# Patient Record
Sex: Male | Born: 1960 | Race: White | Hispanic: No | Marital: Married | Smoking: Former smoker
Health system: Southern US, Community
[De-identification: ages and names within clinical notes are randomized; demographics above are authoritative.]

## PROBLEM LIST (undated history)

## (undated) DIAGNOSIS — E785 Hyperlipidemia, unspecified: Secondary | ICD-10-CM

## (undated) DIAGNOSIS — S270XXA Traumatic pneumothorax, initial encounter: Secondary | ICD-10-CM

## (undated) DIAGNOSIS — I679 Cerebrovascular disease, unspecified: Secondary | ICD-10-CM

## (undated) DIAGNOSIS — Z789 Other specified health status: Secondary | ICD-10-CM

## (undated) DIAGNOSIS — C61 Malignant neoplasm of prostate: Secondary | ICD-10-CM

## (undated) DIAGNOSIS — I872 Venous insufficiency (chronic) (peripheral): Secondary | ICD-10-CM

## (undated) DIAGNOSIS — K7581 Nonalcoholic steatohepatitis (NASH): Secondary | ICD-10-CM

## (undated) DIAGNOSIS — F17200 Nicotine dependence, unspecified, uncomplicated: Secondary | ICD-10-CM

## (undated) DIAGNOSIS — I639 Cerebral infarction, unspecified: Secondary | ICD-10-CM

## (undated) DIAGNOSIS — G4730 Sleep apnea, unspecified: Secondary | ICD-10-CM

## (undated) DIAGNOSIS — I739 Peripheral vascular disease, unspecified: Secondary | ICD-10-CM

## (undated) HISTORY — DX: Nonalcoholic steatohepatitis (NASH): K75.81

## (undated) HISTORY — DX: Traumatic pneumothorax, initial encounter: S27.0XXA

## (undated) HISTORY — DX: Cerebrovascular disease, unspecified: I67.9

## (undated) HISTORY — PX: KNEE SURGERY: SHX244

## (undated) HISTORY — DX: Malignant neoplasm of prostate: C61

## (undated) HISTORY — DX: Hyperlipidemia, unspecified: E78.5

## (undated) HISTORY — DX: Cerebral infarction, unspecified: I63.9

## (undated) HISTORY — DX: Other specified health status: Z78.9

## (undated) HISTORY — DX: Venous insufficiency (chronic) (peripheral): I87.2

## (undated) HISTORY — DX: Nicotine dependence, unspecified, uncomplicated: F17.200

## (undated) HISTORY — DX: Hereditary hemochromatosis: E83.110

## (undated) HISTORY — DX: Peripheral vascular disease, unspecified: I73.9

## (undated) HISTORY — DX: Morbid (severe) obesity due to excess calories: E66.01

## (undated) HISTORY — DX: Sleep apnea, unspecified: G47.30

---

## 2015-11-15 DIAGNOSIS — Z79899 Other long term (current) drug therapy: Secondary | ICD-10-CM | POA: Diagnosis not present

## 2015-11-15 DIAGNOSIS — Z125 Encounter for screening for malignant neoplasm of prostate: Secondary | ICD-10-CM | POA: Diagnosis not present

## 2015-11-15 DIAGNOSIS — E784 Other hyperlipidemia: Secondary | ICD-10-CM | POA: Diagnosis not present

## 2015-12-13 DIAGNOSIS — N401 Enlarged prostate with lower urinary tract symptoms: Secondary | ICD-10-CM | POA: Diagnosis not present

## 2015-12-13 DIAGNOSIS — R972 Elevated prostate specific antigen [PSA]: Secondary | ICD-10-CM | POA: Diagnosis not present

## 2016-01-29 DIAGNOSIS — C61 Malignant neoplasm of prostate: Secondary | ICD-10-CM | POA: Diagnosis not present

## 2016-01-29 DIAGNOSIS — N401 Enlarged prostate with lower urinary tract symptoms: Secondary | ICD-10-CM | POA: Diagnosis not present

## 2016-01-29 DIAGNOSIS — R972 Elevated prostate specific antigen [PSA]: Secondary | ICD-10-CM | POA: Diagnosis not present

## 2016-01-29 HISTORY — PX: PROSTATE BIOPSY: SHX241

## 2016-02-05 DIAGNOSIS — C61 Malignant neoplasm of prostate: Secondary | ICD-10-CM | POA: Diagnosis not present

## 2016-02-05 DIAGNOSIS — J0100 Acute maxillary sinusitis, unspecified: Secondary | ICD-10-CM | POA: Diagnosis not present

## 2016-02-13 DIAGNOSIS — C61 Malignant neoplasm of prostate: Secondary | ICD-10-CM | POA: Diagnosis not present

## 2016-02-13 DIAGNOSIS — I700 Atherosclerosis of aorta: Secondary | ICD-10-CM | POA: Diagnosis not present

## 2016-02-25 DIAGNOSIS — C61 Malignant neoplasm of prostate: Secondary | ICD-10-CM | POA: Diagnosis not present

## 2016-03-10 DIAGNOSIS — R7989 Other specified abnormal findings of blood chemistry: Secondary | ICD-10-CM | POA: Insufficient documentation

## 2016-03-10 DIAGNOSIS — I639 Cerebral infarction, unspecified: Secondary | ICD-10-CM | POA: Insufficient documentation

## 2016-03-10 DIAGNOSIS — M1990 Unspecified osteoarthritis, unspecified site: Secondary | ICD-10-CM | POA: Insufficient documentation

## 2016-03-10 DIAGNOSIS — C61 Malignant neoplasm of prostate: Secondary | ICD-10-CM | POA: Diagnosis not present

## 2016-03-27 DIAGNOSIS — G4733 Obstructive sleep apnea (adult) (pediatric): Secondary | ICD-10-CM | POA: Insufficient documentation

## 2016-03-27 DIAGNOSIS — C61 Malignant neoplasm of prostate: Secondary | ICD-10-CM | POA: Diagnosis not present

## 2016-03-27 DIAGNOSIS — R748 Abnormal levels of other serum enzymes: Secondary | ICD-10-CM | POA: Insufficient documentation

## 2016-03-27 DIAGNOSIS — Z9989 Dependence on other enabling machines and devices: Secondary | ICD-10-CM | POA: Insufficient documentation

## 2016-04-10 DIAGNOSIS — Z8673 Personal history of transient ischemic attack (TIA), and cerebral infarction without residual deficits: Secondary | ICD-10-CM | POA: Diagnosis not present

## 2016-04-10 DIAGNOSIS — C61 Malignant neoplasm of prostate: Secondary | ICD-10-CM | POA: Diagnosis not present

## 2016-04-10 HISTORY — PX: OTHER SURGICAL HISTORY: SHX169

## 2016-05-20 DIAGNOSIS — G4730 Sleep apnea, unspecified: Secondary | ICD-10-CM | POA: Diagnosis not present

## 2016-05-20 DIAGNOSIS — Z9079 Acquired absence of other genital organ(s): Secondary | ICD-10-CM | POA: Diagnosis not present

## 2016-05-20 DIAGNOSIS — C61 Malignant neoplasm of prostate: Secondary | ICD-10-CM | POA: Diagnosis not present

## 2016-07-01 DIAGNOSIS — M25551 Pain in right hip: Secondary | ICD-10-CM | POA: Diagnosis not present

## 2016-07-01 DIAGNOSIS — M1611 Unilateral primary osteoarthritis, right hip: Secondary | ICD-10-CM | POA: Diagnosis not present

## 2016-08-14 DIAGNOSIS — C61 Malignant neoplasm of prostate: Secondary | ICD-10-CM | POA: Diagnosis not present

## 2016-08-14 DIAGNOSIS — N529 Male erectile dysfunction, unspecified: Secondary | ICD-10-CM | POA: Diagnosis not present

## 2016-11-13 DIAGNOSIS — L255 Unspecified contact dermatitis due to plants, except food: Secondary | ICD-10-CM | POA: Diagnosis not present

## 2016-11-13 DIAGNOSIS — E785 Hyperlipidemia, unspecified: Secondary | ICD-10-CM | POA: Diagnosis not present

## 2016-11-13 DIAGNOSIS — I872 Venous insufficiency (chronic) (peripheral): Secondary | ICD-10-CM | POA: Diagnosis not present

## 2016-11-13 DIAGNOSIS — F17200 Nicotine dependence, unspecified, uncomplicated: Secondary | ICD-10-CM | POA: Diagnosis not present

## 2016-11-13 DIAGNOSIS — I679 Cerebrovascular disease, unspecified: Secondary | ICD-10-CM | POA: Diagnosis not present

## 2016-11-20 DIAGNOSIS — C61 Malignant neoplasm of prostate: Secondary | ICD-10-CM | POA: Diagnosis not present

## 2016-11-20 DIAGNOSIS — N529 Male erectile dysfunction, unspecified: Secondary | ICD-10-CM | POA: Diagnosis not present

## 2017-02-26 DIAGNOSIS — N529 Male erectile dysfunction, unspecified: Secondary | ICD-10-CM | POA: Diagnosis not present

## 2017-02-26 DIAGNOSIS — C61 Malignant neoplasm of prostate: Secondary | ICD-10-CM | POA: Diagnosis not present

## 2017-04-07 DIAGNOSIS — S86912A Strain of unspecified muscle(s) and tendon(s) at lower leg level, left leg, initial encounter: Secondary | ICD-10-CM | POA: Diagnosis not present

## 2017-04-07 DIAGNOSIS — S8992XA Unspecified injury of left lower leg, initial encounter: Secondary | ICD-10-CM | POA: Diagnosis not present

## 2017-04-07 DIAGNOSIS — F17210 Nicotine dependence, cigarettes, uncomplicated: Secondary | ICD-10-CM | POA: Diagnosis not present

## 2017-04-07 DIAGNOSIS — M25562 Pain in left knee: Secondary | ICD-10-CM | POA: Diagnosis not present

## 2017-04-08 DIAGNOSIS — M25562 Pain in left knee: Secondary | ICD-10-CM | POA: Diagnosis not present

## 2017-04-09 DIAGNOSIS — M25562 Pain in left knee: Secondary | ICD-10-CM | POA: Diagnosis not present

## 2017-04-09 DIAGNOSIS — M25462 Effusion, left knee: Secondary | ICD-10-CM | POA: Diagnosis not present

## 2017-04-13 DIAGNOSIS — M25462 Effusion, left knee: Secondary | ICD-10-CM | POA: Diagnosis not present

## 2017-04-13 DIAGNOSIS — M25562 Pain in left knee: Secondary | ICD-10-CM | POA: Diagnosis not present

## 2017-04-16 DIAGNOSIS — L259 Unspecified contact dermatitis, unspecified cause: Secondary | ICD-10-CM | POA: Diagnosis not present

## 2017-04-19 DIAGNOSIS — L300 Nummular dermatitis: Secondary | ICD-10-CM | POA: Diagnosis not present

## 2017-04-19 DIAGNOSIS — L299 Pruritus, unspecified: Secondary | ICD-10-CM | POA: Diagnosis not present

## 2017-06-18 DIAGNOSIS — E785 Hyperlipidemia, unspecified: Secondary | ICD-10-CM | POA: Diagnosis not present

## 2017-06-18 DIAGNOSIS — C61 Malignant neoplasm of prostate: Secondary | ICD-10-CM | POA: Diagnosis not present

## 2017-06-18 DIAGNOSIS — N529 Male erectile dysfunction, unspecified: Secondary | ICD-10-CM | POA: Diagnosis not present

## 2017-06-18 DIAGNOSIS — Z8546 Personal history of malignant neoplasm of prostate: Secondary | ICD-10-CM | POA: Diagnosis not present

## 2017-06-18 DIAGNOSIS — I872 Venous insufficiency (chronic) (peripheral): Secondary | ICD-10-CM | POA: Diagnosis not present

## 2017-06-18 DIAGNOSIS — F17200 Nicotine dependence, unspecified, uncomplicated: Secondary | ICD-10-CM | POA: Diagnosis not present

## 2017-07-28 DIAGNOSIS — J0100 Acute maxillary sinusitis, unspecified: Secondary | ICD-10-CM | POA: Diagnosis not present

## 2017-07-28 DIAGNOSIS — J205 Acute bronchitis due to respiratory syncytial virus: Secondary | ICD-10-CM | POA: Diagnosis not present

## 2017-09-24 DIAGNOSIS — K635 Polyp of colon: Secondary | ICD-10-CM | POA: Diagnosis not present

## 2017-09-24 DIAGNOSIS — D124 Benign neoplasm of descending colon: Secondary | ICD-10-CM | POA: Diagnosis not present

## 2017-09-24 DIAGNOSIS — Z86010 Personal history of colonic polyps: Secondary | ICD-10-CM | POA: Diagnosis not present

## 2017-09-24 DIAGNOSIS — Z1211 Encounter for screening for malignant neoplasm of colon: Secondary | ICD-10-CM | POA: Diagnosis not present

## 2017-09-24 DIAGNOSIS — D123 Benign neoplasm of transverse colon: Secondary | ICD-10-CM | POA: Diagnosis not present

## 2017-09-24 HISTORY — PX: COLONOSCOPY: SHX174

## 2017-10-16 DIAGNOSIS — S2231XA Fracture of one rib, right side, initial encounter for closed fracture: Secondary | ICD-10-CM | POA: Diagnosis not present

## 2017-10-16 DIAGNOSIS — R0781 Pleurodynia: Secondary | ICD-10-CM | POA: Diagnosis not present

## 2017-10-16 DIAGNOSIS — S299XXA Unspecified injury of thorax, initial encounter: Secondary | ICD-10-CM | POA: Diagnosis not present

## 2017-10-22 DIAGNOSIS — N529 Male erectile dysfunction, unspecified: Secondary | ICD-10-CM | POA: Diagnosis not present

## 2017-10-22 DIAGNOSIS — C61 Malignant neoplasm of prostate: Secondary | ICD-10-CM | POA: Diagnosis not present

## 2017-12-19 DIAGNOSIS — M545 Low back pain: Secondary | ICD-10-CM | POA: Diagnosis not present

## 2017-12-21 DIAGNOSIS — L03116 Cellulitis of left lower limb: Secondary | ICD-10-CM | POA: Diagnosis not present

## 2017-12-21 DIAGNOSIS — L259 Unspecified contact dermatitis, unspecified cause: Secondary | ICD-10-CM | POA: Diagnosis not present

## 2017-12-21 DIAGNOSIS — L03115 Cellulitis of right lower limb: Secondary | ICD-10-CM | POA: Diagnosis not present

## 2017-12-21 DIAGNOSIS — M545 Low back pain: Secondary | ICD-10-CM | POA: Diagnosis not present

## 2018-02-19 DIAGNOSIS — N3001 Acute cystitis with hematuria: Secondary | ICD-10-CM | POA: Diagnosis not present

## 2018-03-25 DIAGNOSIS — L03115 Cellulitis of right lower limb: Secondary | ICD-10-CM | POA: Diagnosis not present

## 2018-03-25 DIAGNOSIS — L03116 Cellulitis of left lower limb: Secondary | ICD-10-CM | POA: Diagnosis not present

## 2018-03-25 DIAGNOSIS — I872 Venous insufficiency (chronic) (peripheral): Secondary | ICD-10-CM | POA: Diagnosis not present

## 2018-03-25 DIAGNOSIS — F17200 Nicotine dependence, unspecified, uncomplicated: Secondary | ICD-10-CM | POA: Diagnosis not present

## 2018-04-15 DIAGNOSIS — J0100 Acute maxillary sinusitis, unspecified: Secondary | ICD-10-CM | POA: Diagnosis not present

## 2018-04-22 DIAGNOSIS — C61 Malignant neoplasm of prostate: Secondary | ICD-10-CM | POA: Diagnosis not present

## 2018-04-22 DIAGNOSIS — R3989 Other symptoms and signs involving the genitourinary system: Secondary | ICD-10-CM | POA: Diagnosis not present

## 2018-04-22 DIAGNOSIS — N529 Male erectile dysfunction, unspecified: Secondary | ICD-10-CM | POA: Diagnosis not present

## 2018-08-19 DIAGNOSIS — K760 Fatty (change of) liver, not elsewhere classified: Secondary | ICD-10-CM | POA: Insufficient documentation

## 2018-08-19 DIAGNOSIS — F17200 Nicotine dependence, unspecified, uncomplicated: Secondary | ICD-10-CM | POA: Diagnosis not present

## 2018-08-19 DIAGNOSIS — N5231 Erectile dysfunction following radical prostatectomy: Secondary | ICD-10-CM | POA: Insufficient documentation

## 2018-08-19 DIAGNOSIS — Z8546 Personal history of malignant neoplasm of prostate: Secondary | ICD-10-CM | POA: Insufficient documentation

## 2018-08-19 DIAGNOSIS — D6859 Other primary thrombophilia: Secondary | ICD-10-CM | POA: Diagnosis not present

## 2018-08-19 DIAGNOSIS — M6289 Other specified disorders of muscle: Secondary | ICD-10-CM | POA: Insufficient documentation

## 2018-08-19 DIAGNOSIS — E669 Obesity, unspecified: Secondary | ICD-10-CM | POA: Diagnosis not present

## 2018-08-23 DIAGNOSIS — Z8545 Personal history of malignant neoplasm of unspecified male genital organ: Secondary | ICD-10-CM | POA: Diagnosis not present

## 2018-09-20 DIAGNOSIS — M5440 Lumbago with sciatica, unspecified side: Secondary | ICD-10-CM | POA: Diagnosis not present

## 2018-10-21 DIAGNOSIS — E785 Hyperlipidemia, unspecified: Secondary | ICD-10-CM | POA: Diagnosis not present

## 2018-10-21 DIAGNOSIS — I739 Peripheral vascular disease, unspecified: Secondary | ICD-10-CM | POA: Diagnosis not present

## 2018-10-21 DIAGNOSIS — I872 Venous insufficiency (chronic) (peripheral): Secondary | ICD-10-CM | POA: Diagnosis not present

## 2018-10-21 DIAGNOSIS — I679 Cerebrovascular disease, unspecified: Secondary | ICD-10-CM | POA: Diagnosis not present

## 2018-11-25 DIAGNOSIS — R600 Localized edema: Secondary | ICD-10-CM | POA: Diagnosis not present

## 2018-11-25 DIAGNOSIS — I8312 Varicose veins of left lower extremity with inflammation: Secondary | ICD-10-CM | POA: Diagnosis not present

## 2018-12-05 ENCOUNTER — Other Ambulatory Visit: Payer: Self-pay | Admitting: Family Medicine

## 2018-12-05 DIAGNOSIS — R600 Localized edema: Secondary | ICD-10-CM

## 2018-12-16 ENCOUNTER — Ambulatory Visit
Admission: RE | Admit: 2018-12-16 | Discharge: 2018-12-16 | Disposition: A | Discharge status: Home / Self Care | Payer: BC Managed Care – PPO | Source: Ambulatory Visit | Attending: Family Medicine | Admitting: Family Medicine

## 2018-12-16 DIAGNOSIS — Z8546 Personal history of malignant neoplasm of prostate: Secondary | ICD-10-CM | POA: Diagnosis not present

## 2018-12-16 DIAGNOSIS — I700 Atherosclerosis of aorta: Secondary | ICD-10-CM | POA: Diagnosis not present

## 2018-12-16 DIAGNOSIS — R600 Localized edema: Secondary | ICD-10-CM

## 2018-12-16 IMAGING — CT CT ANGIOGRAPHY ABDOMEN AND PELVIS WITH CONTRAST AND WITHOUT CONT
2 of 7 series · 14 of 46 positions shown, 16 images · IV contrast (iopamidol)
Comparison: CT [DATE]

CLINICAL DATA: 57-year-old with lower extremity edema. Former
smoker. History of prostate cancer.

EXAM:
CT ANGIOGRAPHY ABDOMEN AND PELVIS WITH CONTRAST AND WITHOUT CONTRAST
TECHNIQUE: Multidetector CT imaging of the abdomen and pelvis was performed
using the standard protocol during bolus administration of
intravenous contrast. Multiplanar reconstructed images and MIPs were
obtained and reviewed to evaluate the vascular anatomy.
CONTRAST:  75mL [80] IOPAMIDOL ([80]) INJECTION 76%

[Series 5: cta arterial 2.00 bv36 s3 axial st · axial · arterial · 0.95mm/px · z∈[+1079,+1599]mm · 11 of 288 slices shown, 13 images]
[im 14/288  soft-tissue]
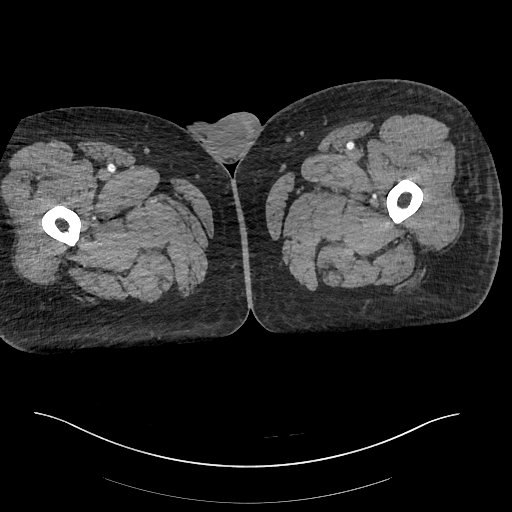
[im 14/288  bone]
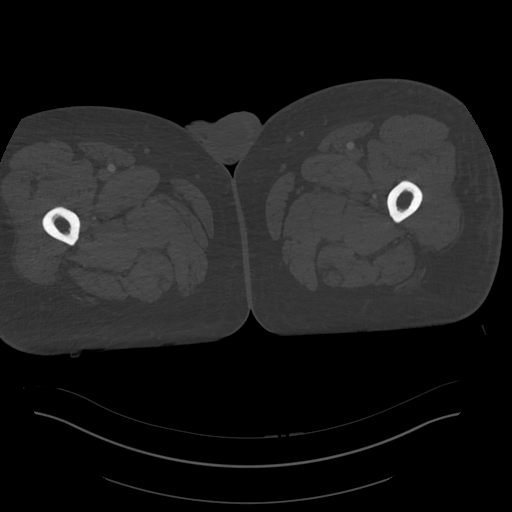
[im 40/288  soft-tissue]
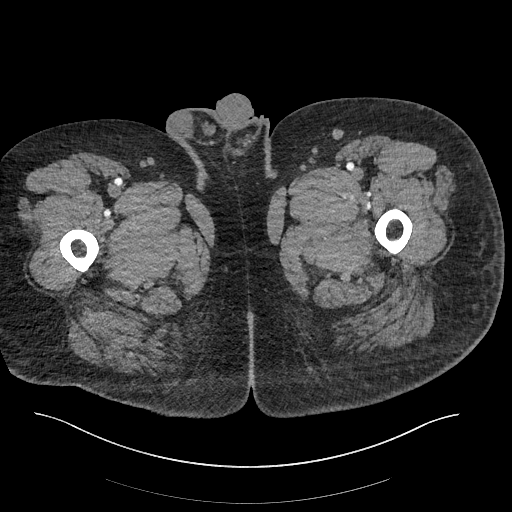
[im 66/288  soft-tissue]
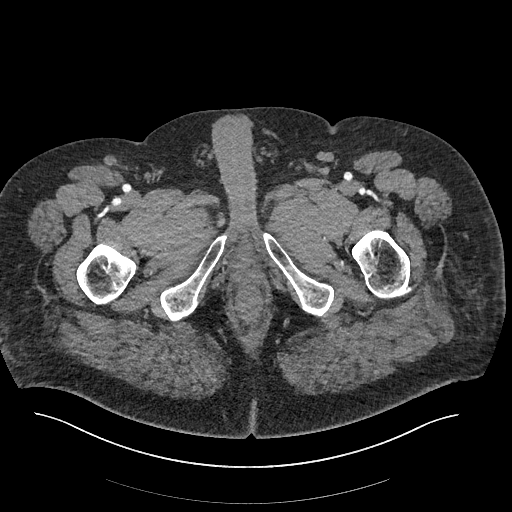
[im 92/288  soft-tissue]
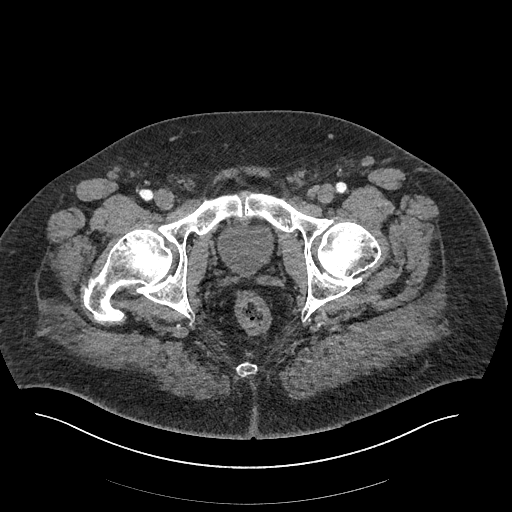
[im 118/288  soft-tissue]
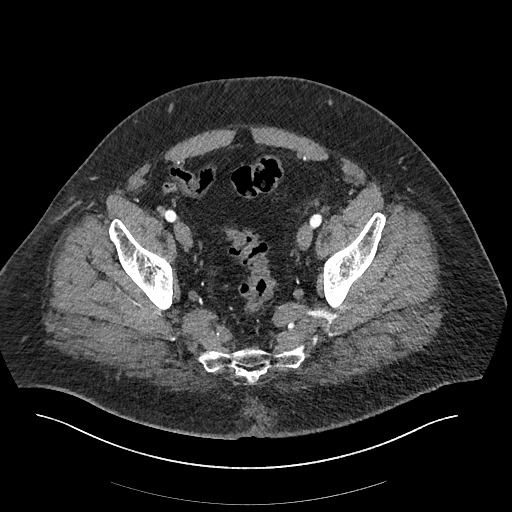
[im 144/288  soft-tissue]
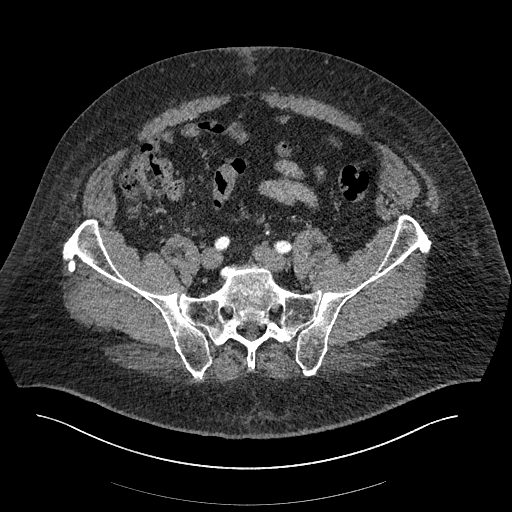
[im 170/288  soft-tissue]
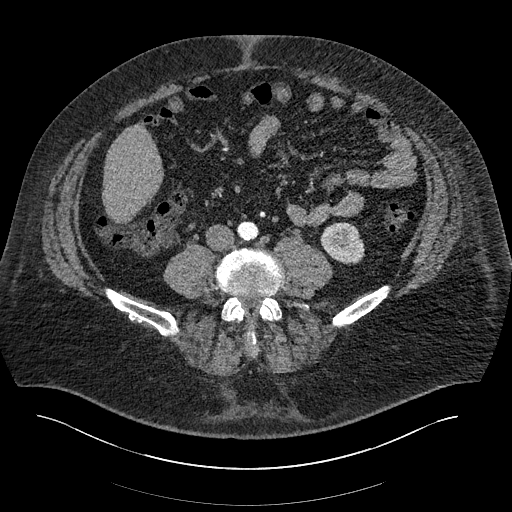
[im 196/288  soft-tissue]
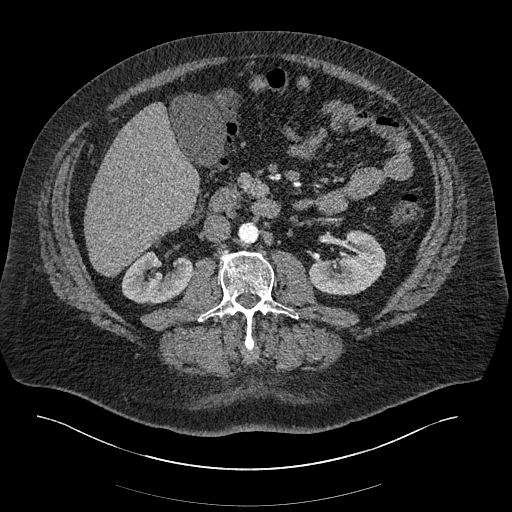
[im 222/288  soft-tissue]
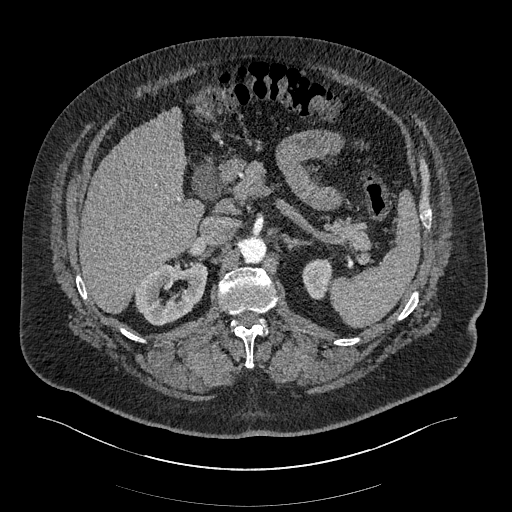
[im 222/288  bone]
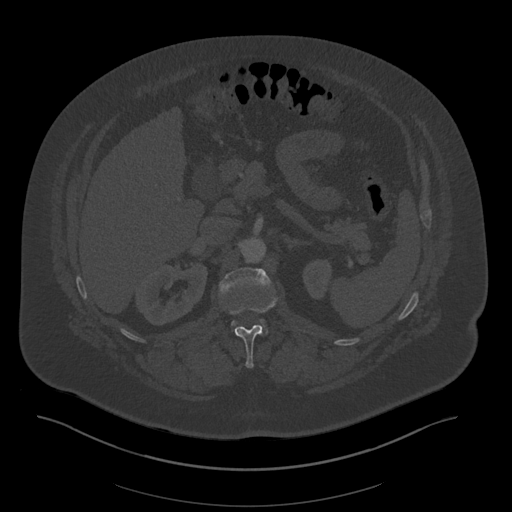
[im 248/288  soft-tissue]
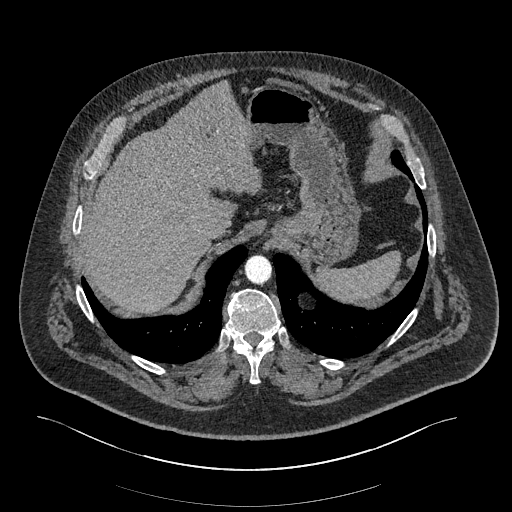
[im 274/288  soft-tissue]
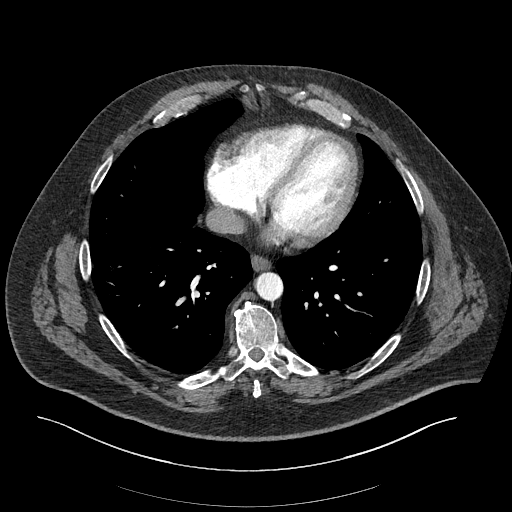

[Series 9: cta arterial 2.00 bv36 s3 cor art st · coronal · arterial · 0.95mm/px · 3 of 206 slices shown]
[im 52/206  soft-tissue]
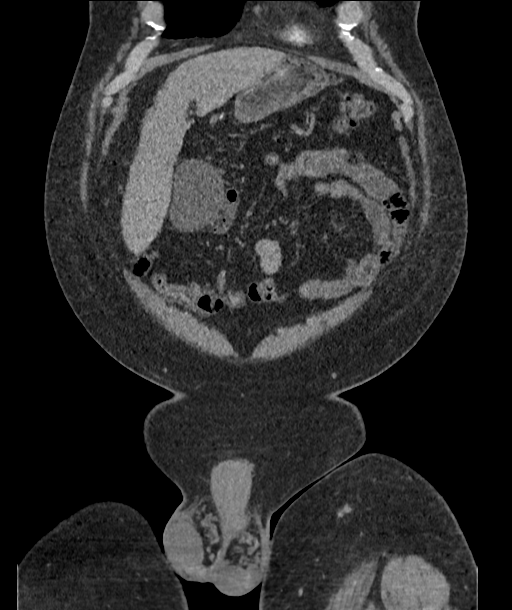
[im 103/206  soft-tissue]
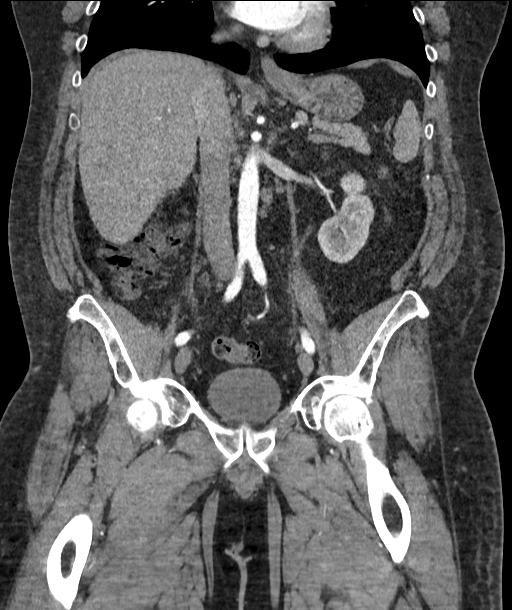
[im 154/206  soft-tissue]
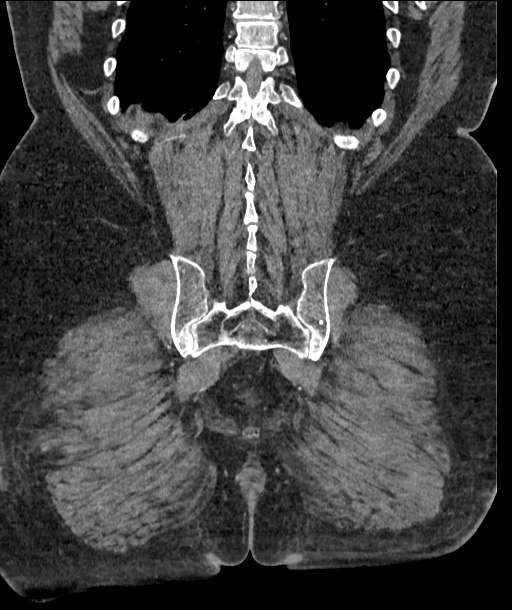

[14 of 46 positions shown; findings below may reference images not displayed]

FINDINGS: VASCULAR

Aorta: Minimal atherosclerotic disease in the abdominal aorta
without aneurysm, dissection or stenosis.

Celiac: Patent without evidence of aneurysm, dissection, vasculitis
or significant stenosis.

SMA: Patent without evidence of aneurysm, dissection, vasculitis or
significant stenosis. Incidentally, there is a replaced right
hepatic artery.

Renals: Both renal arteries are patent without evidence of aneurysm,
dissection, vasculitis, fibromuscular dysplasia or significant
stenosis.

IMA: IMA is patent.

Inflow: Patent without evidence of aneurysm, dissection, vasculitis
or significant stenosis.

Proximal Outflow: Proximal femoral arteries are patent bilaterally.

Veins: Normal appearance of the IVC and renal veins. Main portal
venous system is patent. Pelvic venous structures are not imaged on
the delayed images.

Review of the MIP images confirms the above findings.

NON-VASCULAR

Lower chest: Chronic pleural and parenchymal densities along the
lateral right lower lobe are suggestive for scarring.

Hepatobiliary: Mild distention of the gallbladder without
inflammatory changes. No focal liver abnormality. Concern for a
nodular liver contour.

Pancreas: Unremarkable. No pancreatic ductal dilatation or
surrounding inflammatory changes.

Spleen: Normal in size without focal abnormality.

Adrenals/Urinary Tract: Normal adrenal glands. Urinary bladder is
unremarkable. Negative for hydronephrosis. No ureter dilatation. No
suspicious renal lesions. Evidence for 5 mm stone in the posterior
left kidney.

Stomach/Bowel: Normal appearance of the stomach and duodenum. No
evidence for bowel obstruction or focal bowel inflammation.

Lymphatic: Mildly prominent lymph nodes in the upper abdomen near
the gastrohepatic ligament are similar to the prior examination.
There is a lymph node in the porta hepatis region the measures
cm and previously measured 0.8 cm. Precaval lymph node measures
cm in the short axis and stable. Multiple small periaortic lymph
nodes are similar to the previous examination. Interval enlargement
of left inguinal lymph node on sequence 5, image 194 measuring
cm in the short axis and previously measured 0.7 cm. Enlarged left
inguinal lymph nodes in the left lower groin region measuring up to
1.3 cm on sequence 5, image 211 and previously measured 0.9 cm.
Prominent right inguinal lymph nodes have minimally changed. Distal
right external iliac lymph node is prominent measuring up to 1.6 cm
and minimally enlarged from the prior examination.

Reproductive: History of prostatectomy.

Other: Negative for free fluid. Negative for free air. Small
umbilical hernia containing fat.

Musculoskeletal: Mild disc space narrowing at L5-S1. No suspicious
bone lesions.
IMPRESSION: VASCULAR

1. Minimal atherosclerotic disease in the abdomen and pelvis without
significant stenosis.
2. Although this is an arterial examination, no gross abnormality to
the venous structures. No evidence for venous compression.

NON-VASCULAR

1. Mild enlargement of lymph nodes in the lower pelvis and inguinal
regions. Findings are nonspecific but could be reactive based on
history of lower extremity edema. Mildly prominent lymph nodes in
the abdomen have not significantly changed. Based on the history of
prostate cancer, consider correlating this lymph node enlargement
with a PSA level.
2. Concern for a nodular contour of the liver. Findings could be
related to underlying liver disease and cannot exclude cirrhosis.
3. Nonobstructive left kidney stone.

## 2018-12-16 MED ORDER — IOPAMIDOL (ISOVUE-370) INJECTION 76%
75.0000 mL | Freq: Once | INTRAVENOUS | Status: AC | PRN
  Administered 2018-12-16: 75 mL via INTRAVENOUS

## 2018-12-20 DIAGNOSIS — Z125 Encounter for screening for malignant neoplasm of prostate: Secondary | ICD-10-CM | POA: Diagnosis not present

## 2018-12-20 DIAGNOSIS — R748 Abnormal levels of other serum enzymes: Secondary | ICD-10-CM | POA: Diagnosis not present

## 2018-12-22 ENCOUNTER — Encounter: Payer: Self-pay | Admitting: Vascular Surgery

## 2018-12-22 DIAGNOSIS — L03116 Cellulitis of left lower limb: Secondary | ICD-10-CM | POA: Diagnosis not present

## 2019-01-03 DIAGNOSIS — Q820 Hereditary lymphedema: Secondary | ICD-10-CM | POA: Diagnosis not present

## 2019-01-06 DIAGNOSIS — R972 Elevated prostate specific antigen [PSA]: Secondary | ICD-10-CM | POA: Diagnosis not present

## 2019-01-06 DIAGNOSIS — N529 Male erectile dysfunction, unspecified: Secondary | ICD-10-CM | POA: Diagnosis not present

## 2019-01-06 DIAGNOSIS — C61 Malignant neoplasm of prostate: Secondary | ICD-10-CM | POA: Diagnosis not present

## 2019-01-30 DIAGNOSIS — M545 Low back pain: Secondary | ICD-10-CM | POA: Diagnosis not present

## 2019-02-03 DIAGNOSIS — Q820 Hereditary lymphedema: Secondary | ICD-10-CM | POA: Diagnosis not present

## 2019-03-17 DIAGNOSIS — C61 Malignant neoplasm of prostate: Secondary | ICD-10-CM | POA: Diagnosis not present

## 2019-04-05 ENCOUNTER — Encounter: Payer: Self-pay | Admitting: Sports Medicine

## 2019-04-05 ENCOUNTER — Ambulatory Visit (INDEPENDENT_AMBULATORY_CARE_PROVIDER_SITE_OTHER): Payer: Self-pay | Admitting: Sports Medicine

## 2019-04-05 ENCOUNTER — Ambulatory Visit (INDEPENDENT_AMBULATORY_CARE_PROVIDER_SITE_OTHER): Payer: Self-pay

## 2019-04-05 ENCOUNTER — Other Ambulatory Visit: Payer: Self-pay

## 2019-04-05 DIAGNOSIS — M79672 Pain in left foot: Secondary | ICD-10-CM

## 2019-04-05 DIAGNOSIS — I739 Peripheral vascular disease, unspecified: Secondary | ICD-10-CM

## 2019-04-05 DIAGNOSIS — M21622 Bunionette of left foot: Secondary | ICD-10-CM

## 2019-04-05 DIAGNOSIS — M722 Plantar fascial fibromatosis: Secondary | ICD-10-CM

## 2019-04-05 DIAGNOSIS — M21621 Bunionette of right foot: Secondary | ICD-10-CM

## 2019-04-05 DIAGNOSIS — M79671 Pain in right foot: Secondary | ICD-10-CM

## 2019-04-05 DIAGNOSIS — M779 Enthesopathy, unspecified: Secondary | ICD-10-CM

## 2019-04-05 MED ORDER — TRIAMCINOLONE ACETONIDE 10 MG/ML IJ SUSP: 10.0000 mg | Freq: Once | INTRAMUSCULAR | Status: DC

## 2019-04-05 MED ORDER — PREDNISONE 10 MG (21) PO TBPK: ORAL_TABLET | ORAL | 0 refills | Status: DC

## 2019-04-05 NOTE — Progress Notes (Signed)
Subjective: Darryl Velazquez is a 58 y.o. male patient presents to office with complaint of moderate pain on the bottoms of both heels and at both baby toe joints reports that the pain has been going on for a couple of months very sharp with the right foot worse reports that there is some swelling tingling can barely touch to the right fifth toe joint reports that he experienced a lot of swelling in his legs when he quit smoking and also has some knee pain as well reports that the pain is progressive and by the end of the week the pain is very bad.  Patient has tried using his compression socks and compression pump which seems to help the swelling but does nothing for the pain that he has in both feet.  Patient also admits to a history of back problems and works as a Dealer. Denies any other pedal complaints.   Review of Systems  All other systems reviewed and are negative.  Patient reports that he is considering going back to smoking because everything hurts after he stopped.  Patient Active Problem List   Diagnosis Date Noted  . Erectile dysfunction after radical prostatectomy 08/19/2018  . Hypercoagulable state ([place]) 08/19/2018  . NAFLD (nonalcoholic fatty liver disease) 08/19/2018  . Pelvic floor dysfunction 08/19/2018  . Personal history of prostate cancer 08/19/2018  . Tobacco use disorder 08/19/2018  . Elevated liver enzymes 03/27/2016  . OSA on CPAP 03/27/2016  . Class 2 severe obesity due to excess calories with serious comorbidity and body mass index (BMI) of 39.0 to 39.9 in adult ([place]) 03/11/2016  . Arthritis 03/10/2016  . Cancer of prostate with high recurrence risk (stage T3a or Gleason 8-10 or PSA > 20) 03/10/2016  . Elevated ferritin 03/10/2016  . Stroke [facility]) 03/10/2016    Current Outpatient Medications on File Prior to Visit  Medication Sig Dispense Refill  . rosuvastatin (CRESTOR) 10 MG tablet Take 10 mg by mouth daily.    . Aspirin Buf,CaCarb-MgCarb-MgO, 81 MG TABS Take  by mouth.    . hydrochlorothiazide (HYDRODIURIL) 25 MG tablet     . TURMERIC PO Take 400 mg by mouth.     No current facility-administered medications on file prior to visit.     Not on File  Objective: Physical Exam General: The patient is alert and oriented x3 in no acute distress.  Dermatology: Skin is warm, dry and supple bilateral lower extremities. Nails 1-10 are normal. There is no erythema, edema, no eccymosis, no open lesions present. Integument is otherwise unremarkable.  Vascular: Dorsalis Pedis pulse 1/4and Posterior Tibial pulse are 0/4 bilateral. Capillary fill time is immediate to all digits.  1+ pitting edema bilateral.  Neurological: Grossly intact to light touch with an achilles reflex of +2/5 and a  negative Tinel's sign bilateral.  Musculoskeletal: Tenderness to palpation at the medial calcaneal tubercale and through the insertion of the plantar fascia and extending into the arch bilateral.  There is pain to palpation to fifth tarsal phalangeal joints bilateral right greater than left with tailor's bunion deformity.. No pain with compression of calcaneus bilateral. No pain with tuning fork to calcaneus bilateral. No pain with calf compression bilateral. There is decreased Ankle joint range of motion bilateral. All other joints range of motion within normal limits bilateral. Strength 5/5 in all groups bilateral.   Gait: Unassisted, Antalgic bilateral  Xray, Right/Left foot:  Normal osseous mineralization. Joint spaces preserved. No fracture/dislocation/boney destruction. Calcaneal spur present with mild thickening of  plantar fascia.  There is increased intermetatarsal angle supportive of tailor's bunion deformity bilateral right greater than left with mild soft tissue swelling.  No other soft tissue abnormalities or radiopaque foreign bodies.   Assessment and Plan: Problem List Items Addressed This Visit    None    Visit Diagnoses    Bilateral foot pain    -   Primary   Relevant Orders   DG Foot Complete Right   DG Foot Complete Left   Plantar fasciitis, bilateral       Capsulitis       Relevant Medications   triamcinolone acetonide (KENALOG) 10 MG/ML injection 10 mg (Start on 04/05/2019  1:00 PM)   Tailor's bunionette, bilateral       Right is worse and more symptomatic   PVD (peripheral vascular disease) ([place])       Relevant Medications   rosuvastatin (CRESTOR) 10 MG tablet      -Complete examination performed.  -Xrays reviewed -Discussed with patient in detail the condition of plantar fasciitis with lateral compensation that has aggravated his tailor bunions bilateral in the setting of edema in the limbs, how this occurs and general treatment options. Explained both conservative and surgical treatments.  -After oral consent and aseptic prep, injected a mixture containing 1 ml of 2%  plain lidocaine, 1 ml 0.5% plain marcaine, 0.5 ml of kenalog 10 and 0.5 ml of dexamethasone phosphate into right fifth metatarsophalangeal joint.  Post-injection care discussed with patient.  -Rx prednisone Dosepak to take as instructed -Recommended good supportive shoes and advised patient that he will need custom molded insoles but at this time since he is so tender we will hold off on getting them until reevaluation next visit -Explained and dispensed to patient daily stretching exercises. -Recommend patient to ice affected area 1-2x daily. -Encourage patient to continue with smoking cessation -Patient to return to office in 2-3 weeks for follow up or sooner if problems or questions arise.  Landis Martins, DPM

## 2019-04-05 NOTE — Patient Instructions (Signed)

## 2019-04-06 ENCOUNTER — Other Ambulatory Visit: Payer: Self-pay | Admitting: Sports Medicine

## 2019-04-06 DIAGNOSIS — M722 Plantar fascial fibromatosis: Secondary | ICD-10-CM

## 2019-04-21 ENCOUNTER — Ambulatory Visit (INDEPENDENT_AMBULATORY_CARE_PROVIDER_SITE_OTHER): Payer: Self-pay | Admitting: Sports Medicine

## 2019-04-21 ENCOUNTER — Encounter: Payer: Self-pay | Admitting: Sports Medicine

## 2019-04-21 ENCOUNTER — Other Ambulatory Visit: Payer: Self-pay

## 2019-04-21 DIAGNOSIS — M21621 Bunionette of right foot: Secondary | ICD-10-CM

## 2019-04-21 DIAGNOSIS — M21622 Bunionette of left foot: Secondary | ICD-10-CM

## 2019-04-21 DIAGNOSIS — I739 Peripheral vascular disease, unspecified: Secondary | ICD-10-CM

## 2019-04-21 DIAGNOSIS — M722 Plantar fascial fibromatosis: Secondary | ICD-10-CM

## 2019-04-21 DIAGNOSIS — M79671 Pain in right foot: Secondary | ICD-10-CM

## 2019-04-21 DIAGNOSIS — M79672 Pain in left foot: Secondary | ICD-10-CM

## 2019-04-21 MED ORDER — TRIAMCINOLONE ACETONIDE 10 MG/ML IJ SUSP
10.0000 mg | Freq: Once | INTRAMUSCULAR | Status: AC
  Administered 2019-04-21: 10 mg

## 2019-04-21 MED ORDER — DICLOFENAC SODIUM 75 MG PO TBEC: 75.0000 mg | DELAYED_RELEASE_TABLET | Freq: Two times a day (BID) | ORAL | 0 refills | Status: DC

## 2019-04-21 NOTE — Progress Notes (Signed)
Subjective: Darryl Velazquez is a 58 y.o. male patient returns office for follow-up evaluation of bilateral foot pain reports that the pain is a little better but still painful especially by the time that he goes for lunch pain is 5-6 out of 10 reports that pain is constant when he is up on his feet with multiple tender areas along the plantar surfaces along the backs of the heel as well as along the sides of the baby toe joints reports that the pain on the right seems a little bit better after the injection but he is still hurting.  Patient also is suffering with swelling in both feet and legs and reports that he is taking his fluid medication but cannot wear his compression stockings because they present his toes and makes that pain worse.  Patient denies any other pedal complaints or acute symptoms at this time.  Patient Active Problem List   Diagnosis Date Noted  . Erectile dysfunction after radical prostatectomy 08/19/2018  . Hypercoagulable state ([place]) 08/19/2018  . NAFLD (nonalcoholic fatty liver disease) 08/19/2018  . Pelvic floor dysfunction 08/19/2018  . Personal history of prostate cancer 08/19/2018  . Tobacco use disorder 08/19/2018  . Elevated liver enzymes 03/27/2016  . OSA on CPAP 03/27/2016  . Class 2 severe obesity due to excess calories with serious comorbidity and body mass index (BMI) of 39.0 to 39.9 in adult ([place]) 03/11/2016  . Arthritis 03/10/2016  . Cancer of prostate with high recurrence risk (stage T3a or Gleason 8-10 or PSA > 20) 03/10/2016  . Elevated ferritin 03/10/2016  . Stroke [facility]) 03/10/2016    Current Outpatient Medications on File Prior to Visit  Medication Sig Dispense Refill  . Aspirin Buf,CaCarb-MgCarb-MgO, 81 MG TABS Take by mouth.    . hydrochlorothiazide (HYDRODIURIL) 25 MG tablet     . predniSONE (STERAPRED UNI-PAK 21 TAB) 10 MG (21) TBPK tablet Take as directed 21 tablet 0  . rosuvastatin (CRESTOR) 10 MG tablet Take 10 mg by mouth daily.    .  TURMERIC PO Take 400 mg by mouth.     Current Facility-Administered Medications on File Prior to Visit  Medication Dose Route Frequency Provider Last Rate Last Dose  . triamcinolone acetonide (KENALOG) 10 MG/ML injection 10 mg  10 mg Other Once Landis Martins, DPM        Not on File  Objective: Physical Exam General: The patient is alert and oriented x3 in no acute distress.  Dermatology: Skin is warm, dry and supple bilateral lower extremities. Nails 1-10 are normal. There is no erythema, edema, no eccymosis, no open lesions present. Integument is otherwise unremarkable.  Vascular: Dorsalis Pedis pulse 1/4and Posterior Tibial pulse are 0/4 bilateral. Capillary fill time is immediate to all digits.  1+ pitting edema bilateral.  Neurological: Grossly intact to light touch with an achilles reflex of +2/5 and a  negative Tinel's sign bilateral.  Musculoskeletal: Tenderness to palpation at the medial calcaneal tubercale and through the insertion of the plantar fascia and extending into the arch bilateral.  There is pain to palpation to fifth tarsal phalangeal joints bilateral.  There is also pain at the Achilles insertion bilateral.  No pain with compression of calcaneus bilateral. No pain with tuning fork to calcaneus bilateral. No pain with calf compression bilateral. There is decreased Ankle joint range of motion bilateral. All other joints range of motion within normal limits bilateral. Strength 5/5 in all groups bilateral.     Assessment and Plan: Problem List Items  Addressed This Visit    None    Visit Diagnoses    Plantar fasciitis, bilateral    -  Primary   Relevant Medications   triamcinolone acetonide (KENALOG) 10 MG/ML injection 10 mg (Completed) (Start on 04/21/2019  1:00 PM)   Tailor's bunionette, bilateral       PVD (peripheral vascular disease) ([place])       Bilateral foot pain          -Complete examination performed.  -Re-Discussed with patient in detail the  condition of plantar fasciitis with Achilles tendinitis and lateral compensation that has aggravated his tailor bunions bilateral in the setting of edema in the limbs, how this occurs and general treatment options. Explained both conservative and surgical treatments.  -After oral consent and aseptic prep, injected a mixture containing 1 ml of 2%  plain lidocaine, 1 ml 0.5% plain marcaine, 0.5 ml of kenalog 10 and 0.5 ml of dexamethasone phosphate into bilateral heels at plantar fascial glabrous junction.  Post-injection care discussed with patient.  -Rx diclofenac to take in place of Aleve -Recommended good supportive shoes and advised him orthotics patient to see Liliane Channel and bring his work boots with him to see if we can make a device that can help offload the plantar surfaces of both feet and his problem areas especially at the tailor's bunion -Encourage patient to continue with daily stretching and icing -Dispensed heel lifts for patient to use meanwhile until he can be seen for custom orthotics -Patient to return to office in 2-3 weeks for follow up or sooner if problems or questions arise.  Landis Martins, DPM

## 2019-04-29 DIAGNOSIS — R05 Cough: Secondary | ICD-10-CM | POA: Diagnosis not present

## 2019-04-29 DIAGNOSIS — R509 Fever, unspecified: Secondary | ICD-10-CM | POA: Diagnosis not present

## 2019-05-02 DIAGNOSIS — R6883 Chills (without fever): Secondary | ICD-10-CM | POA: Diagnosis not present

## 2019-05-02 DIAGNOSIS — J0100 Acute maxillary sinusitis, unspecified: Secondary | ICD-10-CM | POA: Diagnosis not present

## 2019-05-02 DIAGNOSIS — R509 Fever, unspecified: Secondary | ICD-10-CM | POA: Diagnosis not present

## 2019-05-17 ENCOUNTER — Other Ambulatory Visit: Payer: Self-pay | Admitting: Orthotics

## 2019-05-19 ENCOUNTER — Telehealth: Payer: Self-pay

## 2019-05-19 ENCOUNTER — Ambulatory Visit (INDEPENDENT_AMBULATORY_CARE_PROVIDER_SITE_OTHER): Payer: Self-pay | Admitting: Sports Medicine

## 2019-05-19 ENCOUNTER — Encounter: Payer: Self-pay | Admitting: Sports Medicine

## 2019-05-19 ENCOUNTER — Other Ambulatory Visit: Payer: Self-pay

## 2019-05-19 DIAGNOSIS — I739 Peripheral vascular disease, unspecified: Secondary | ICD-10-CM

## 2019-05-19 DIAGNOSIS — M79672 Pain in left foot: Secondary | ICD-10-CM

## 2019-05-19 DIAGNOSIS — M722 Plantar fascial fibromatosis: Secondary | ICD-10-CM

## 2019-05-19 DIAGNOSIS — M21621 Bunionette of right foot: Secondary | ICD-10-CM

## 2019-05-19 DIAGNOSIS — M21622 Bunionette of left foot: Secondary | ICD-10-CM

## 2019-05-19 DIAGNOSIS — M216X2 Other acquired deformities of left foot: Secondary | ICD-10-CM

## 2019-05-19 DIAGNOSIS — M79671 Pain in right foot: Secondary | ICD-10-CM

## 2019-05-19 DIAGNOSIS — M216X1 Other acquired deformities of right foot: Secondary | ICD-10-CM

## 2019-05-19 MED ORDER — DICLOFENAC SODIUM 75 MG PO TBEC: 75.0000 mg | DELAYED_RELEASE_TABLET | Freq: Two times a day (BID) | ORAL | 0 refills | Status: DC

## 2019-05-19 NOTE — Telephone Encounter (Signed)
Pt came in today for feet scanning for custome orthotics

## 2019-05-19 NOTE — Progress Notes (Signed)
Refilled diclofenac for bilateral foot pain for patient to take until he can get his new set of custom orthotics to help with his foot pain. Patient was scanned for custom functional foot orthotics by CMA. -Dr. Cannon Kettle

## 2019-05-19 NOTE — Progress Notes (Signed)
Pt. Came in today to do feet scanning for orthotics.

## 2019-06-07 ENCOUNTER — Other Ambulatory Visit: Payer: Self-pay | Admitting: Orthotics

## 2019-06-08 ENCOUNTER — Other Ambulatory Visit: Payer: Self-pay | Admitting: Orthotics

## 2019-06-21 ENCOUNTER — Ambulatory Visit: Payer: BC Managed Care – PPO | Admitting: Orthotics

## 2019-06-21 ENCOUNTER — Other Ambulatory Visit: Payer: Self-pay

## 2019-06-21 DIAGNOSIS — M21621 Bunionette of right foot: Secondary | ICD-10-CM

## 2019-06-21 DIAGNOSIS — M21622 Bunionette of left foot: Secondary | ICD-10-CM

## 2019-06-21 DIAGNOSIS — M722 Plantar fascial fibromatosis: Secondary | ICD-10-CM

## 2019-06-21 NOTE — Progress Notes (Signed)
Patient picked up f/o with 5h met offloads; he seemed happy with fit; however, he feels he might be back for further adjustments.

## 2019-07-21 DIAGNOSIS — I679 Cerebrovascular disease, unspecified: Secondary | ICD-10-CM | POA: Diagnosis not present

## 2019-07-21 DIAGNOSIS — E785 Hyperlipidemia, unspecified: Secondary | ICD-10-CM | POA: Diagnosis not present

## 2019-07-21 DIAGNOSIS — K2100 Gastro-esophageal reflux disease with esophagitis, without bleeding: Secondary | ICD-10-CM | POA: Diagnosis not present

## 2019-07-21 DIAGNOSIS — I872 Venous insufficiency (chronic) (peripheral): Secondary | ICD-10-CM | POA: Diagnosis not present

## 2019-09-01 DIAGNOSIS — E280 Estrogen excess: Secondary | ICD-10-CM | POA: Insufficient documentation

## 2019-09-01 DIAGNOSIS — Z8546 Personal history of malignant neoplasm of prostate: Secondary | ICD-10-CM | POA: Diagnosis not present

## 2019-09-01 DIAGNOSIS — E348 Other specified endocrine disorders: Secondary | ICD-10-CM | POA: Diagnosis not present

## 2019-09-01 DIAGNOSIS — N5231 Erectile dysfunction following radical prostatectomy: Secondary | ICD-10-CM | POA: Diagnosis not present

## 2019-09-01 DIAGNOSIS — D6859 Other primary thrombophilia: Secondary | ICD-10-CM | POA: Diagnosis not present

## 2019-09-01 DIAGNOSIS — K760 Fatty (change of) liver, not elsewhere classified: Secondary | ICD-10-CM | POA: Diagnosis not present

## 2019-09-15 DIAGNOSIS — K760 Fatty (change of) liver, not elsewhere classified: Secondary | ICD-10-CM | POA: Diagnosis not present

## 2019-09-15 DIAGNOSIS — D6859 Other primary thrombophilia: Secondary | ICD-10-CM | POA: Diagnosis not present

## 2019-09-15 DIAGNOSIS — E348 Other specified endocrine disorders: Secondary | ICD-10-CM | POA: Diagnosis not present

## 2019-10-04 DIAGNOSIS — Z03818 Encounter for observation for suspected exposure to other biological agents ruled out: Secondary | ICD-10-CM | POA: Diagnosis not present

## 2019-10-04 DIAGNOSIS — Z20822 Contact with and (suspected) exposure to covid-19: Secondary | ICD-10-CM | POA: Diagnosis not present

## 2019-10-11 DIAGNOSIS — D6859 Other primary thrombophilia: Secondary | ICD-10-CM | POA: Diagnosis not present

## 2019-10-11 DIAGNOSIS — N529 Male erectile dysfunction, unspecified: Secondary | ICD-10-CM | POA: Diagnosis not present

## 2019-10-11 DIAGNOSIS — Z87891 Personal history of nicotine dependence: Secondary | ICD-10-CM | POA: Diagnosis not present

## 2019-10-11 DIAGNOSIS — Z9079 Acquired absence of other genital organ(s): Secondary | ICD-10-CM | POA: Diagnosis not present

## 2019-10-11 DIAGNOSIS — R600 Localized edema: Secondary | ICD-10-CM | POA: Diagnosis not present

## 2019-10-11 DIAGNOSIS — Z79899 Other long term (current) drug therapy: Secondary | ICD-10-CM | POA: Diagnosis not present

## 2019-10-11 DIAGNOSIS — N5231 Erectile dysfunction following radical prostatectomy: Secondary | ICD-10-CM | POA: Diagnosis not present

## 2019-10-11 DIAGNOSIS — Z6841 Body Mass Index (BMI) 40.0 and over, adult: Secondary | ICD-10-CM | POA: Diagnosis not present

## 2019-10-11 DIAGNOSIS — Z8673 Personal history of transient ischemic attack (TIA), and cerebral infarction without residual deficits: Secondary | ICD-10-CM | POA: Diagnosis not present

## 2019-10-11 DIAGNOSIS — Z7982 Long term (current) use of aspirin: Secondary | ICD-10-CM | POA: Diagnosis not present

## 2019-10-11 DIAGNOSIS — K219 Gastro-esophageal reflux disease without esophagitis: Secondary | ICD-10-CM | POA: Diagnosis not present

## 2019-10-11 DIAGNOSIS — Z8546 Personal history of malignant neoplasm of prostate: Secondary | ICD-10-CM | POA: Diagnosis not present

## 2019-10-11 DIAGNOSIS — K760 Fatty (change of) liver, not elsewhere classified: Secondary | ICD-10-CM | POA: Diagnosis not present

## 2019-10-11 DIAGNOSIS — R7989 Other specified abnormal findings of blood chemistry: Secondary | ICD-10-CM | POA: Diagnosis not present

## 2019-10-11 DIAGNOSIS — G4733 Obstructive sleep apnea (adult) (pediatric): Secondary | ICD-10-CM | POA: Diagnosis not present

## 2019-10-11 HISTORY — PX: PENILE PROSTHESIS IMPLANT: SHX240

## 2019-10-12 DIAGNOSIS — D6859 Other primary thrombophilia: Secondary | ICD-10-CM | POA: Diagnosis not present

## 2019-10-12 DIAGNOSIS — K219 Gastro-esophageal reflux disease without esophagitis: Secondary | ICD-10-CM | POA: Diagnosis not present

## 2019-10-12 DIAGNOSIS — Z7982 Long term (current) use of aspirin: Secondary | ICD-10-CM | POA: Diagnosis not present

## 2019-10-12 DIAGNOSIS — K760 Fatty (change of) liver, not elsewhere classified: Secondary | ICD-10-CM | POA: Diagnosis not present

## 2019-10-12 DIAGNOSIS — Z9079 Acquired absence of other genital organ(s): Secondary | ICD-10-CM | POA: Diagnosis not present

## 2019-10-12 DIAGNOSIS — Z8546 Personal history of malignant neoplasm of prostate: Secondary | ICD-10-CM | POA: Diagnosis not present

## 2019-10-12 DIAGNOSIS — Z8673 Personal history of transient ischemic attack (TIA), and cerebral infarction without residual deficits: Secondary | ICD-10-CM | POA: Diagnosis not present

## 2019-10-12 DIAGNOSIS — N5231 Erectile dysfunction following radical prostatectomy: Secondary | ICD-10-CM | POA: Diagnosis not present

## 2019-10-12 DIAGNOSIS — Z87891 Personal history of nicotine dependence: Secondary | ICD-10-CM | POA: Diagnosis not present

## 2019-10-12 DIAGNOSIS — Z6841 Body Mass Index (BMI) 40.0 and over, adult: Secondary | ICD-10-CM | POA: Diagnosis not present

## 2019-10-12 DIAGNOSIS — Z79899 Other long term (current) drug therapy: Secondary | ICD-10-CM | POA: Diagnosis not present

## 2019-10-12 DIAGNOSIS — R600 Localized edema: Secondary | ICD-10-CM | POA: Diagnosis not present

## 2019-10-12 DIAGNOSIS — R7989 Other specified abnormal findings of blood chemistry: Secondary | ICD-10-CM | POA: Diagnosis not present

## 2019-10-12 DIAGNOSIS — G4733 Obstructive sleep apnea (adult) (pediatric): Secondary | ICD-10-CM | POA: Diagnosis not present

## 2020-02-22 DIAGNOSIS — Z08 Encounter for follow-up examination after completed treatment for malignant neoplasm: Secondary | ICD-10-CM | POA: Diagnosis not present

## 2020-02-22 DIAGNOSIS — N5231 Erectile dysfunction following radical prostatectomy: Secondary | ICD-10-CM | POA: Diagnosis not present

## 2020-02-22 DIAGNOSIS — Z8546 Personal history of malignant neoplasm of prostate: Secondary | ICD-10-CM | POA: Diagnosis not present

## 2020-02-22 DIAGNOSIS — Z6839 Body mass index (BMI) 39.0-39.9, adult: Secondary | ICD-10-CM | POA: Diagnosis not present

## 2020-02-29 DIAGNOSIS — R509 Fever, unspecified: Secondary | ICD-10-CM | POA: Diagnosis not present

## 2020-02-29 DIAGNOSIS — N3000 Acute cystitis without hematuria: Secondary | ICD-10-CM | POA: Diagnosis not present

## 2020-02-29 DIAGNOSIS — R110 Nausea: Secondary | ICD-10-CM | POA: Diagnosis not present

## 2020-02-29 DIAGNOSIS — Z20828 Contact with and (suspected) exposure to other viral communicable diseases: Secondary | ICD-10-CM | POA: Diagnosis not present

## 2020-03-02 DIAGNOSIS — R001 Bradycardia, unspecified: Secondary | ICD-10-CM | POA: Diagnosis not present

## 2020-03-02 DIAGNOSIS — R509 Fever, unspecified: Secondary | ICD-10-CM | POA: Diagnosis not present

## 2020-03-02 DIAGNOSIS — Z8546 Personal history of malignant neoplasm of prostate: Secondary | ICD-10-CM | POA: Diagnosis not present

## 2020-03-02 DIAGNOSIS — M79605 Pain in left leg: Secondary | ICD-10-CM | POA: Diagnosis not present

## 2020-03-02 DIAGNOSIS — R112 Nausea with vomiting, unspecified: Secondary | ICD-10-CM | POA: Diagnosis not present

## 2020-03-02 DIAGNOSIS — R5381 Other malaise: Secondary | ICD-10-CM | POA: Diagnosis not present

## 2020-03-02 DIAGNOSIS — Z20822 Contact with and (suspected) exposure to covid-19: Secondary | ICD-10-CM | POA: Diagnosis not present

## 2020-03-02 DIAGNOSIS — R600 Localized edema: Secondary | ICD-10-CM | POA: Diagnosis not present

## 2020-03-02 DIAGNOSIS — G459 Transient cerebral ischemic attack, unspecified: Secondary | ICD-10-CM | POA: Diagnosis not present

## 2020-03-02 DIAGNOSIS — R531 Weakness: Secondary | ICD-10-CM | POA: Diagnosis not present

## 2020-03-02 DIAGNOSIS — R7989 Other specified abnormal findings of blood chemistry: Secondary | ICD-10-CM | POA: Diagnosis not present

## 2020-03-02 DIAGNOSIS — R5383 Other fatigue: Secondary | ICD-10-CM | POA: Diagnosis not present

## 2020-03-02 DIAGNOSIS — R0602 Shortness of breath: Secondary | ICD-10-CM | POA: Diagnosis not present

## 2020-03-02 DIAGNOSIS — M7989 Other specified soft tissue disorders: Secondary | ICD-10-CM | POA: Diagnosis not present

## 2020-03-03 DIAGNOSIS — I498 Other specified cardiac arrhythmias: Secondary | ICD-10-CM | POA: Diagnosis not present

## 2020-03-03 DIAGNOSIS — R001 Bradycardia, unspecified: Secondary | ICD-10-CM | POA: Diagnosis not present

## 2020-03-08 DIAGNOSIS — R0600 Dyspnea, unspecified: Secondary | ICD-10-CM | POA: Diagnosis not present

## 2020-03-08 DIAGNOSIS — B370 Candidal stomatitis: Secondary | ICD-10-CM | POA: Diagnosis not present

## 2020-03-08 DIAGNOSIS — B3781 Candidal esophagitis: Secondary | ICD-10-CM | POA: Diagnosis not present

## 2020-03-08 DIAGNOSIS — I872 Venous insufficiency (chronic) (peripheral): Secondary | ICD-10-CM | POA: Diagnosis not present

## 2020-03-14 DIAGNOSIS — I361 Nonrheumatic tricuspid (valve) insufficiency: Secondary | ICD-10-CM | POA: Diagnosis not present

## 2020-03-14 DIAGNOSIS — R0600 Dyspnea, unspecified: Secondary | ICD-10-CM | POA: Diagnosis not present

## 2020-03-26 DIAGNOSIS — R300 Dysuria: Secondary | ICD-10-CM | POA: Diagnosis not present

## 2020-04-05 DIAGNOSIS — Z789 Other specified health status: Secondary | ICD-10-CM | POA: Insufficient documentation

## 2020-04-05 DIAGNOSIS — E785 Hyperlipidemia, unspecified: Secondary | ICD-10-CM | POA: Insufficient documentation

## 2020-04-05 DIAGNOSIS — I739 Peripheral vascular disease, unspecified: Secondary | ICD-10-CM | POA: Insufficient documentation

## 2020-04-05 DIAGNOSIS — G4730 Sleep apnea, unspecified: Secondary | ICD-10-CM | POA: Insufficient documentation

## 2020-04-05 DIAGNOSIS — I679 Cerebrovascular disease, unspecified: Secondary | ICD-10-CM | POA: Insufficient documentation

## 2020-04-05 DIAGNOSIS — K7581 Nonalcoholic steatohepatitis (NASH): Secondary | ICD-10-CM | POA: Insufficient documentation

## 2020-04-05 DIAGNOSIS — C61 Malignant neoplasm of prostate: Secondary | ICD-10-CM | POA: Insufficient documentation

## 2020-04-05 DIAGNOSIS — S270XXA Traumatic pneumothorax, initial encounter: Secondary | ICD-10-CM | POA: Insufficient documentation

## 2020-04-05 DIAGNOSIS — I872 Venous insufficiency (chronic) (peripheral): Secondary | ICD-10-CM | POA: Insufficient documentation

## 2020-04-05 DIAGNOSIS — F17200 Nicotine dependence, unspecified, uncomplicated: Secondary | ICD-10-CM | POA: Insufficient documentation

## 2020-04-19 ENCOUNTER — Other Ambulatory Visit: Payer: Self-pay

## 2020-04-19 ENCOUNTER — Encounter: Payer: Self-pay | Admitting: Gastroenterology

## 2020-04-19 ENCOUNTER — Ambulatory Visit: Payer: BC Managed Care – PPO | Admitting: Cardiology

## 2020-04-19 ENCOUNTER — Encounter: Payer: Self-pay | Admitting: Cardiology

## 2020-04-19 VITALS — BP 142/64 | HR 59 | Ht 76.0 in | Wt 317.6 lb

## 2020-04-19 DIAGNOSIS — K760 Fatty (change of) liver, not elsewhere classified: Secondary | ICD-10-CM

## 2020-04-19 DIAGNOSIS — R600 Localized edema: Secondary | ICD-10-CM | POA: Diagnosis not present

## 2020-04-19 DIAGNOSIS — E669 Obesity, unspecified: Secondary | ICD-10-CM

## 2020-04-19 DIAGNOSIS — Z79899 Other long term (current) drug therapy: Secondary | ICD-10-CM

## 2020-04-19 MED ORDER — POTASSIUM CHLORIDE CRYS ER 20 MEQ PO TBCR: 20.0000 meq | EXTENDED_RELEASE_TABLET | Freq: Every day | ORAL | 3 refills | Status: DC

## 2020-04-19 MED ORDER — FUROSEMIDE 40 MG PO TABS: 40.0000 mg | ORAL_TABLET | ORAL | 3 refills | Status: DC

## 2020-04-19 NOTE — Addendum Note (Signed)
Addended by: Mendel Ryder on: 04/19/2020 03:02 PM   Modules accepted: Orders

## 2020-04-19 NOTE — Patient Instructions (Addendum)
Medication Instructions:  1) Start Lasix to 40 mg daily on Mondays, Wednesdays , Fridays and Sundays, Take Lasix twice a day on Tuesdays, Thursdays and Saturdays   2) Start Potassium Choloride 20 meq daily    3) Stop Hydrochlorothiazide    *If you need a refill on your cardiac medications before your next appointment, please call your pharmacy*   Lab Work: Bmp, Magnesium. Ferritin and Hepatic Function test   If you have labs (blood work) drawn today and your tests are completely normal, you will receive your results only by: Marland Kitchen MyChart Message (if you have MyChart) OR . A paper copy in the mail If you have any lab test that is abnormal or we need to change your treatment, we will call you to review the results.   Testing/Procedures: Your physician has ordered for you to have an ultrasound of your liver  Your physician has referred you to see [facility] gastroenterology in [place]: At [facility], you and your health needs are our priority.  As part of our continuing mission to provide you with exceptional heart care, we have created designated Provider Care Teams.  These Care Teams include your primary Cardiologist (physician) and Advanced Practice Providers (APPs -  Physician Assistants and Nurse Practitioners) who all work together to provide you with the care you need, when you need it.  We recommend signing up for the patient portal called "MyChart".  Sign up information is provided on this After Visit Summary.  MyChart is used to connect with patients for Virtual Visits (Telemedicine).  Patients are able to view lab/test results, encounter notes, upcoming appointments, etc.  Non-urgent messages can be sent to your provider as well.   To learn more about what you can do with MyChart, go to [email or web address].    Your next appointment:   4 week(s)  The format for your next appointment:   In Person  Provider:   Berniece Salines, DO   Other Instructions None

## 2020-04-19 NOTE — Progress Notes (Signed)
Cardiology Office Note:    Date:  04/19/2020   ID:  Darryl Velazquez, DOB 1960/12/25, MRN [id number]  PCP:  Street, Darryl Mt, MD  Cardiologist:  Berniece Salines, DO  Electrophysiologist:  None   Referring MD: Street, Darryl Velazquez, *   " I have been having lots of leg swelling"  History of Present Illness:    Darryl Velazquez is a 59 y.o. male with a hx of history of CVA on aspirin, morbid obesity, nonalcoholic steatohepatitis, hereditary hemochromatosis, dyslipidemia peripheral vascular disease, prostate cancer and sleep apnea.  The patient was referred by his primary care doctor to be evaluated for bilateral leg edema.  He tells me this has been going on for many months.  He has been on multiple medicines to help him drain the fluid but this is still persistent.  He is concerned about this.  He denies any chest pain, shortness of breath. He reports that he is also experiencing nausea.   Past Medical History:  Diagnosis Date  . Cerebrovascular disease   . Chronic venous insufficiency   . Dyslipidemia   . Hemochromatosis, hereditary (Darryl Velazquez)   . Medication intolerance   . Morbid obesity (Darryl Velazquez)   . Nicotine dependence   . Nonalcoholic steatohepatitis   . Peripheral vascular disease (Darryl Velazquez)   . Pneumothorax, traumatic   . Prostate cancer (Darryl Velazquez)   . Prostatic adenocarcinoma (Darryl Velazquez)   . Sleep apnea   . Stroke Darryl Velazquez)    2 prior to age 11.  . Venous stasis dermatitis of both lower extremities     Past Surgical History:  Procedure Laterality Date  . COLONOSCOPY  09/24/2017  . KNEE SURGERY Right   . PENILE PROSTHESIS IMPLANT  10/11/2019  . PROSTATE BIOPSY  01/29/2016   Transurethral  . prostatectomy  04/10/2016   Abdominal    Current Medications: Current Meds  Medication Sig  . Aspirin Buf,CaCarb-MgCarb-MgO, 81 MG TABS Take by mouth.  Marland Kitchen ibuprofen (ADVIL) 400 MG tablet Take 400 mg by mouth every 6 (six) hours as needed.  Marland Kitchen NAPROXEN PO Take 220 mg by mouth 2 (two) times daily with a  meal.  . rosuvastatin (CRESTOR) 10 MG tablet Take 10 mg by mouth daily.  . [DISCONTINUED] hydrochlorothiazide (HYDRODIURIL) 25 MG tablet    Current Facility-Administered Medications for the 04/19/20 encounter (Office Visit) with Berniece Salines, DO  Medication  . triamcinolone acetonide (KENALOG) 10 MG/ML injection 10 mg     Allergies:   Patient has no known allergies.   Social History   Socioeconomic History  . Marital status: Married    Spouse name: Not on file  . Number of children: Not on file  . Years of education: Not on file  . Highest education level: Not on file  Occupational History  . Not on file  Tobacco Use  . Smoking status: Former Smoker    Quit date: 11/2018    Years since quitting: 1.4  . Smokeless tobacco: Never Used  Substance and Sexual Activity  . Alcohol use: Not Currently  . Drug use: Never  . Sexual activity: Not on file  Other Topics Concern  . Not on file  Social History Narrative  . Not on file   Social Determinants of Health   Financial Resource Strain:   . Difficulty of Paying Living Expenses: Not on file  Food Insecurity:   . Worried About Charity fundraiser in the Last Year: Not on file  . Ran Out of Food in the Last  Year: Not on file  Transportation Needs:   . Lack of Transportation (Medical): Not on file  . Lack of Transportation (Non-Medical): Not on file  Physical Activity:   . Days of Exercise per Week: Not on file  . Minutes of Exercise per Session: Not on file  Stress:   . Feeling of Stress : Not on file  Social Connections:   . Frequency of Communication with Friends and Family: Not on file  . Frequency of Social Gatherings with Friends and Family: Not on file  . Attends Religious Services: Not on file  . Active Member of Clubs or Organizations: Not on file  . Attends Archivist Meetings: Not on file  . Marital Status: Not on file     Family History: The patient's family history includes Diabetes in his father  and sister; Hypertension in his sister; Kidney failure in his father.  ROS:   Review of Systems  Constitution: Negative for decreased appetite, fever and weight gain.  HENT: Negative for congestion, ear discharge, hoarse voice and sore throat.   Eyes: Negative for discharge, redness, vision loss in right eye and visual halos.  Cardiovascular: Negative for chest pain, dyspnea on exertion, leg swelling, orthopnea and palpitations.  Respiratory: Negative for cough, hemoptysis, shortness of breath and snoring.   Endocrine: Negative for heat intolerance and polyphagia.  Hematologic/Lymphatic: Negative for bleeding problem. Does not bruise/bleed easily.  Skin: Negative for flushing, nail changes, rash and suspicious lesions.  Musculoskeletal: Negative for arthritis, joint pain, muscle cramps, myalgias, neck pain and stiffness.  Gastrointestinal: Negative for abdominal pain, bowel incontinence, diarrhea and excessive appetite.  Genitourinary: Negative for decreased libido, genital sores and incomplete emptying.  Neurological: Negative for brief paralysis, focal weakness, headaches and loss of balance.  Psychiatric/Behavioral: Negative for altered mental status, depression and suicidal ideas.  Allergic/Immunologic: Negative for HIV exposure and persistent infections.    EKGs/Labs/Other Studies Reviewed:    The following studies were reviewed today:   EKG:  The ekg ordered today demonstrates sinus bradycardia, heart rate 59 bpm with arrhythmia.....................  Echocardiogram done on March 14, 2020 shows mild left ventricle hypertrophy.  Normal EF 60 to 65%.  Right ventricle was normal in size and function.  Left atrium is mildly dilated.  Right atrium was normal in size and function.  Interatrial and interventricular septum intact.  There was mild aortic valve sclerosis.  Trace mitral vegetation.  Mild tricuspid regurgitation present.  No pulmonary regurgitation present.  CTA chest  FINDINGS:  Cardiovascular: The heart is normal in size. No pericardial effusion. Theaorta is normal in caliber. No dissection. The branch vessels are patent. No coronary artery calcifications are identified.   The pulmonary arterial tree is suboptimally opacified but no definite filling defects to suggest pulmonary embolism.   Mediastinum/Nodes: Small scattered mediastinal and hilar lymph nodes but no mass or overt adenopathy.   Lungs/Pleura: No acute pulmonary findings. No worrisome pulmonary lesions. No pleural effusions or pleural nodules. No interstitial lung disease or bronchiectasis.   Upper Abdomen: No significant upper abdominal findings. The liver contour is moderately irregular. Findings could suggest cirrhosis. Recommend correlation with liver function studies. Borderline upper abdominal lymph nodes typically seen with cirrhosis.   Musculoskeletal: Mild symmetric gynecomastia. No supraclavicular or axillary adenopathy. The thyroid gland appears normal.   The bony thorax is intact.   Review of the MIP images confirms the above findings.   IMPRESSION:  1. No CT findings for pulmonary embolism.  2. Normal thoracic aorta.  3. No acute pulmonary findings.  4. Suspect cirrhosis. Recommend correlation with liver function  studies.    Electronically Signed  By: Marijo Sanes M.D.  On: 03/02/2020 17:41   Recent Labs: No results found for requested labs within last 8760 hours.  Recent Lipid Panel No results found for: CHOL, TRIG, HDL, CHOLHDL, VLDL, LDLCALC, LDLDIRECT  Physical Exam:    VS:  BP (!) 142/64 (BP Location: Right Arm)   Pulse (!) 59   Ht 6' 4"  (1.93 m)   Wt (!) 317 lb 9.6 oz (144.1 kg)   SpO2 97%   BMI 38.66 kg/m     Wt Readings from Last 3 Encounters:  04/19/20 (!) 317 lb 9.6 oz (144.1 kg)     GEN: Well nourished, well developed in no acute distress HEENT: Normal NECK: No JVD; No carotid bruits LYMPHATICS: No lymphadenopathy CARDIAC: S1S2  noted,RRR, no murmurs, rubs, gallops RESPIRATORY:  Clear to auscultation without rales, wheezing or rhonchi  ABDOMEN: Soft, non-tender, non-distended, +bowel sounds, no guarding. EXTREMITIES: +2 bilateral lower extremity edema, No cyanosis, no clubbing MUSCULOSKELETAL:  No deformity  SKIN: Warm and dry NEUROLOGIC:  Alert and oriented x 3, non-focal PSYCHIATRIC:  Normal affect, good insight  ASSESSMENT:    1. Bilateral leg edema   2. Medication management   3. Fatty liver   4. Hereditary hemochromatosis ([place])   5. Obesity (BMI 30-39.9)    PLAN:     His bilateral leg edema may be multifactorial.  I do suspect that his underlying disease history of liver disease may be contributing to this patient problems.  At this time I am going to stop hydrochlorothiazide and put the patient on diuretic therapy Lasix 40 mg daily with Lasix 40 mg twice a day every other day and potassium supplement.  I intend to see the patient in 4 weeks to make sure that we assess his kidney function and see if he is improving from the diuretics.  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  I am going to get ferritin and TIBC levels in this patient in addition with his LFTs.  BMP and mag will also be done to get his electrolytes and kidney function.  An ultrasound of the liver has been ordered as well as consultation/evaluation by gastroenterology.  The patient is in agreement with the above plan. The patient left the office in stable condition.  The patient will follow up in   Medication Adjustments/Labs and Tests Ordered: Current medicines are reviewed at length with the patient today.  Concerns regarding medicines are outlined above.  Orders Placed This Encounter  Procedures  . US Abdomen Limited RUQ (LIVER/GB)  . Ferritin  . Basic metabolic panel  . Magnesium  . Hepatic function panel  . Fe+TIBC+Fer  . Ambulatory referral to Gastroenterology  . EKG 12-Lead    Meds ordered this encounter  Medications  . furosemide (LASIX) 40 MG tablet    Sig: Take 1 tablet (40 mg total) by mouth as directed. Increase Lasix to 40 mg daily on Mondays, Wednesdays , Fridays and Sundays Take Lasix twice a day on Tuesdays, Thursdays and Saturdays    Dispense:  270 tablet    Refill:  3    Patient Instructions  Medication Instructions:  1) Increase Lasix to 40 mg daily on Mondays, Wednesdays , Fridays and Sundays, Take Lasix twice a day on Tuesdays, Thursdays and Saturdays   2) Stop Hydrochlorothiazide    *If you need a  refill on your cardiac medications before your next appointment, please call your pharmacy*   Lab Work: Bmp, Magnesium. Ferritin and Hepatic Function test   If you have labs (blood work) drawn today and your tests are completely normal, you will receive your results only by: Marland Kitchen MyChart Message (if you have MyChart) OR . A paper copy in the mail If you have any lab test that is abnormal or we need to change your treatment, we will call you to review the results.   Testing/Procedures: Your physician has ordered for you to have an ultrasound of your liver  Your physician has referred you to see [facility] gastroenterology in [place]: At [facility], you and your health needs are our priority.  As part of our continuing mission to provide you with exceptional heart care, we have created designated Provider Care Teams.  These Care Teams include your primary Cardiologist (physician) and Advanced Practice Providers (APPs -  Physician Assistants and Nurse Practitioners) who all work together to provide you with the care you need, when you need it.  We recommend signing up for the patient portal called "MyChart".  Sign up information is provided on this After Visit Summary.  MyChart is used to connect with patients for Virtual Visits (Telemedicine).  Patients are able to view lab/test results, encounter notes, upcoming appointments,  etc.  Non-urgent messages can be sent to your provider as well.   To learn more about what you can do with MyChart, go to [email or web address].    Your next appointment:   4 week(s)  The format for your next appointment:   In Person  Provider:   Berniece Salines, DO   Other Instructions None      Adopting a Healthy Lifestyle.  Know what a healthy weight is for you (roughly BMI <25) and aim to maintain this   Aim for 7+ servings of fruits and vegetables daily   65-80+ fluid ounces of water or unsweet tea for healthy kidneys   Limit to max 1 drink of alcohol per day; avoid smoking/tobacco   Limit animal fats in diet for cholesterol and heart health - choose grass fed whenever available   Avoid highly processed foods, and foods high in saturated/trans fats   Aim for low stress - take time to unwind and care for your mental health   Aim for 150 min of moderate intensity exercise weekly for heart health, and weights twice weekly for bone health   Aim for 7-9 hours of sleep daily   When it comes to diets, agreement about the perfect plan isnt easy to find, even among the experts. Experts at the [employer] developed an idea known as the Healthy Eating Plate. Just imagine a plate divided into logical, healthy portions.   The emphasis is on diet quality:   Load up on vegetables and fruits - one-half of your plate: Aim for color and variety, and remember that potatoes dont count.   Go for whole grains - one-quarter of your plate: Whole wheat, barley, wheat berries, quinoa, oats, brown rice, and foods made with them. If you want pasta, go with whole wheat pasta.   Protein power - one-quarter of your plate: Fish, chicken, beans, and nuts are all healthy, versatile protein sources. Limit red meat.   The diet, however, does go beyond the plate, offering a few other suggestions.   Use healthy plant oils, such as olive, canola, soy, corn, sunflower and peanut.  Check the labels, and avoid partially hydrogenated oil, which have unhealthy trans fats.   If youre thirsty, drink water. Coffee and tea are good in moderation, but skip sugary drinks and limit milk and dairy products to one or two daily servings.   The type of carbohydrate in the diet is more important than the amount. Some sources of carbohydrates, such as vegetables, fruits, whole grains, and beans-are healthier than others.   Finally, stay active  Signed, Berniece Salines, DO  04/19/2020 2:54 PM    [facility]

## 2020-04-20 LAB — BASIC METABOLIC PANEL
BUN/Creatinine Ratio: 15 (ref 9–20)
BUN: 11 mg/dL (ref 6–24)
CO2: 29 mmol/L (ref 20–29)
Calcium: 9.1 mg/dL (ref 8.7–10.2)
Chloride: 102 mmol/L (ref 96–106)
Creatinine, Ser: 0.75 mg/dL — ABNORMAL LOW (ref 0.76–1.27)
GFR calc Af Amer: 116 mL/min/{1.73_m2} (ref >59)
GFR calc non Af Amer: 100 mL/min/{1.73_m2} (ref >59)
Glucose: 88 mg/dL (ref 65–99)
Potassium: 3.3 mmol/L — ABNORMAL LOW (ref 3.5–5.2)
Sodium: 140 mmol/L (ref 134–144)

## 2020-04-20 LAB — HEPATIC FUNCTION PANEL
ALT: 58 IU/L — ABNORMAL HIGH (ref 0–44)
AST: 88 IU/L — ABNORMAL HIGH (ref 0–40)
Albumin: 3 g/dL — ABNORMAL LOW (ref 3.8–4.9)
Alkaline Phosphatase: 183 IU/L — ABNORMAL HIGH (ref 44–121)
Bilirubin Total: 1.8 mg/dL — ABNORMAL HIGH (ref 0.0–1.2)
Bilirubin, Direct: 0.66 mg/dL — ABNORMAL HIGH (ref 0.00–0.40)
Total Protein: 6.6 g/dL (ref 6.0–8.5)

## 2020-04-20 LAB — FERRITIN: Ferritin: 968 ng/mL — ABNORMAL HIGH (ref 30–400)

## 2020-04-20 LAB — MAGNESIUM: Magnesium: 1.9 mg/dL (ref 1.6–2.3)

## 2020-04-22 ENCOUNTER — Telehealth: Payer: Self-pay

## 2020-04-22 NOTE — Telephone Encounter (Signed)
The patient has been notified of the result and verbalized understanding.  All questions (if any) were answered. Pt aware to take extra dose of Potassium. Pt aware to keep 11/17 ultrasound appt and states he has a GI appt in December  Karen Huhta R Lafreda Casebeer, [place] 04/22/2020 1:45 PM

## 2020-04-22 NOTE — Telephone Encounter (Signed)
-----   Message from Berniece Salines, DO sent at 04/22/2020  1:35 PM EST ----- Your ferritin is elevated which in May be in the presence of your hereditary hemochromatosis.  Your liver enzymes are also elevated.  Please keep your appointment on November 17 to get your ultrasound.Please check with the patient if he had scheduled GI appointment if not could you help in making sure the patient get a sooner appointment.  Your potassium is 3.3 take an extra dose of your potassium chloride tablets.

## 2020-04-24 ENCOUNTER — Telehealth: Payer: Self-pay | Admitting: Cardiology

## 2020-04-24 ENCOUNTER — Other Ambulatory Visit: Payer: Self-pay

## 2020-04-24 ENCOUNTER — Ambulatory Visit (HOSPITAL_BASED_OUTPATIENT_CLINIC_OR_DEPARTMENT_OTHER)
Admission: RE | Admit: 2020-04-24 | Discharge: 2020-04-24 | Disposition: A | Discharge status: Home / Self Care | Payer: BC Managed Care – PPO | Source: Ambulatory Visit | Attending: Cardiology | Admitting: Cardiology

## 2020-04-24 DIAGNOSIS — K760 Fatty (change of) liver, not elsewhere classified: Secondary | ICD-10-CM

## 2020-04-24 IMAGING — US US ABDOMEN LIMITED
1 series · 13 of 25 positions shown · non-contrast
Comparison: None.

CLINICAL DATA: Cirrhosis

EXAM:
ULTRASOUND ABDOMEN LIMITED RIGHT UPPER QUADRANT

[Series 1: us abdomen limited · 13 of 39 slices shown]
[im 1/39]
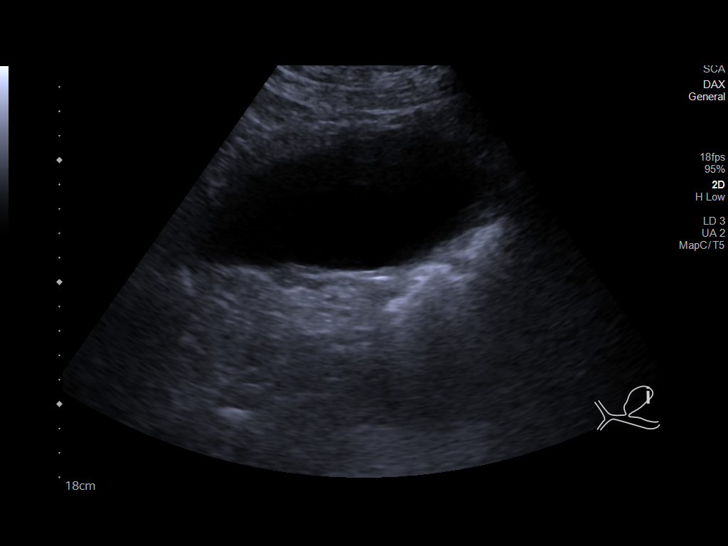
[im 4/39]
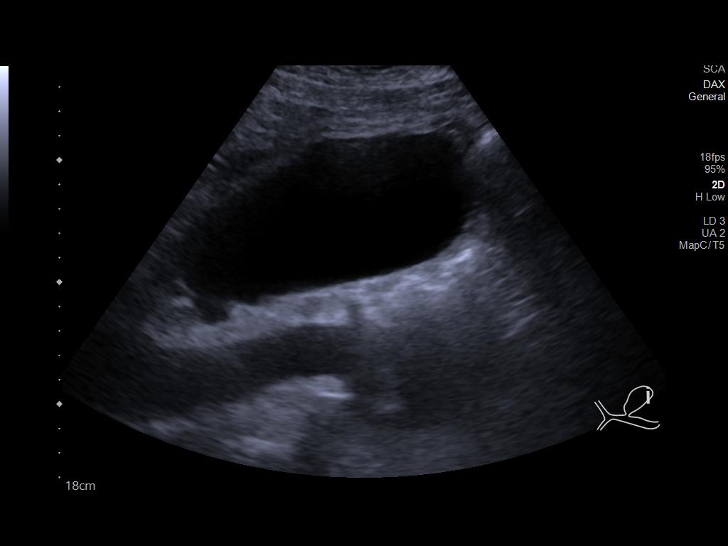
[im 7/39]
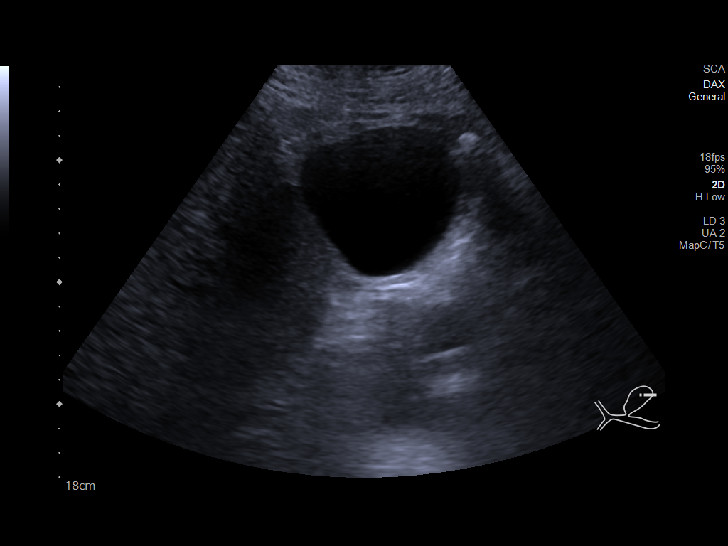
[im 10/39]
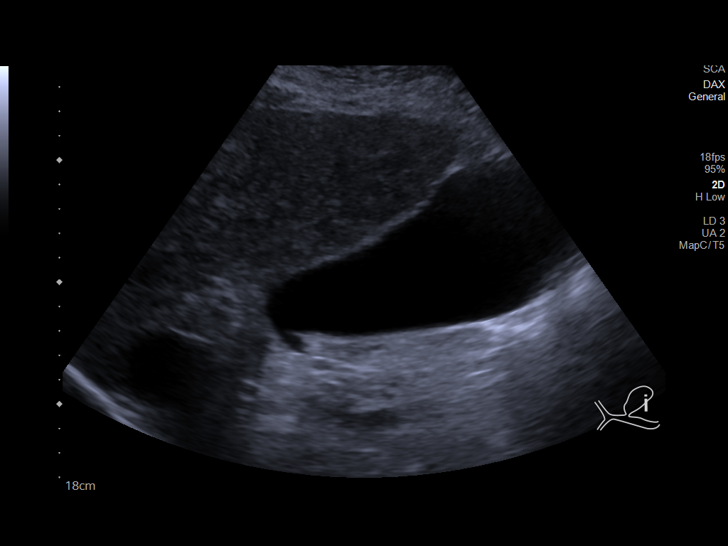
[im 13/39]
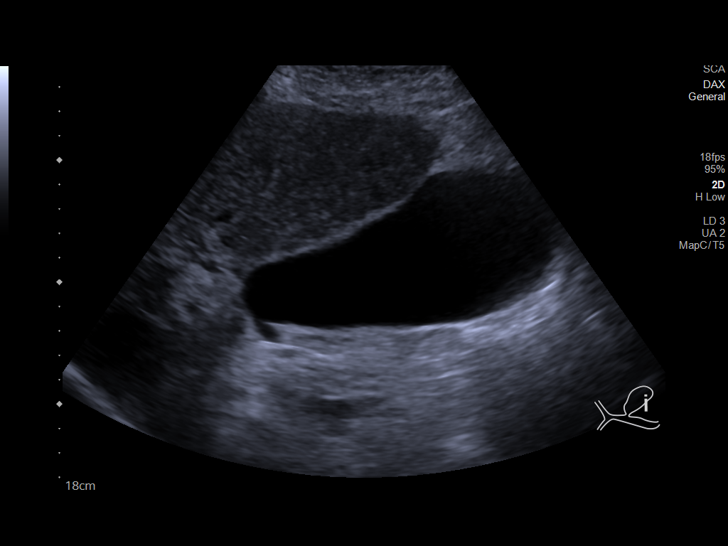
[im 16/39]
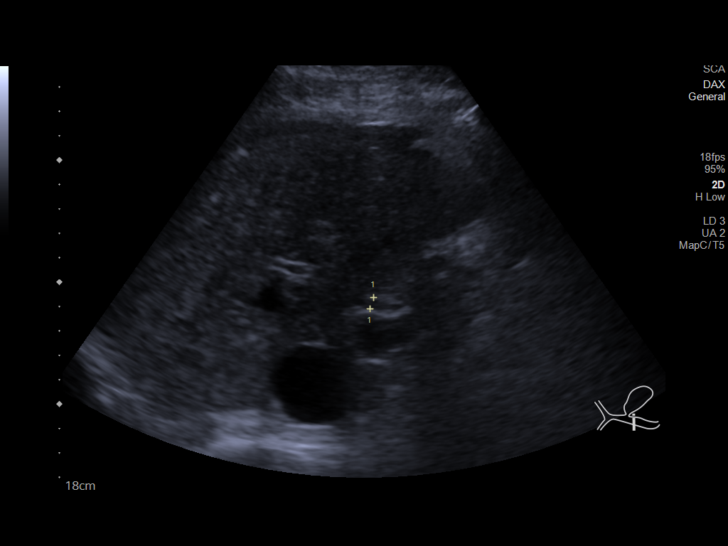
[im 20/39]
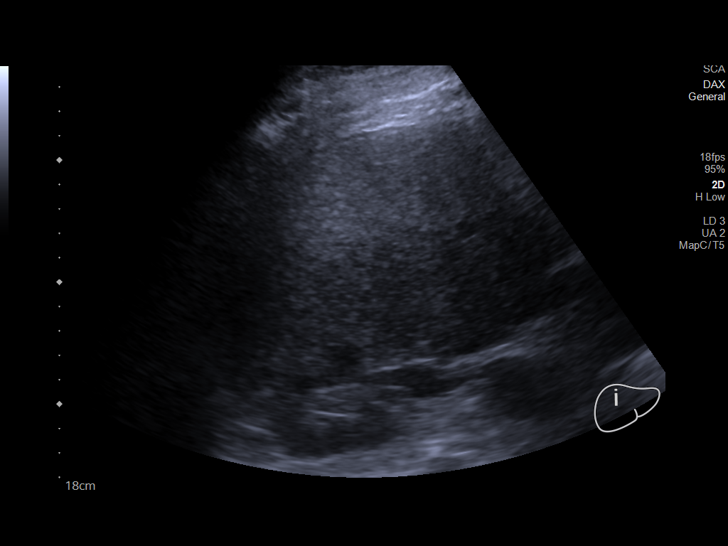
[im 23/39]
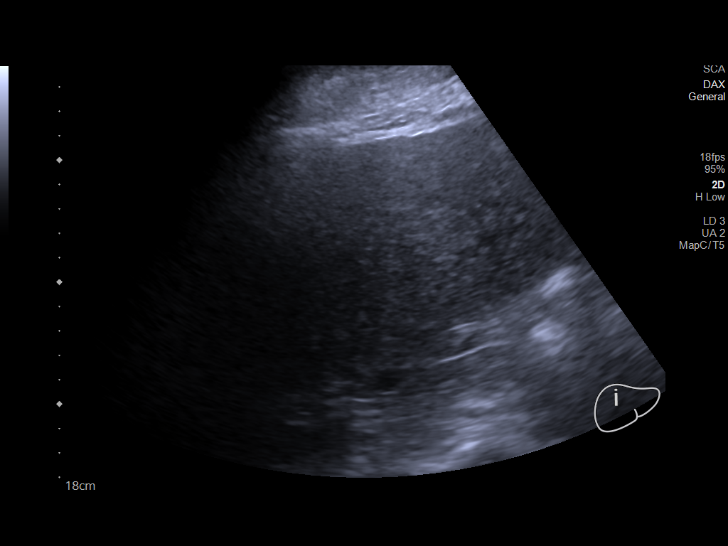
[im 26/39]
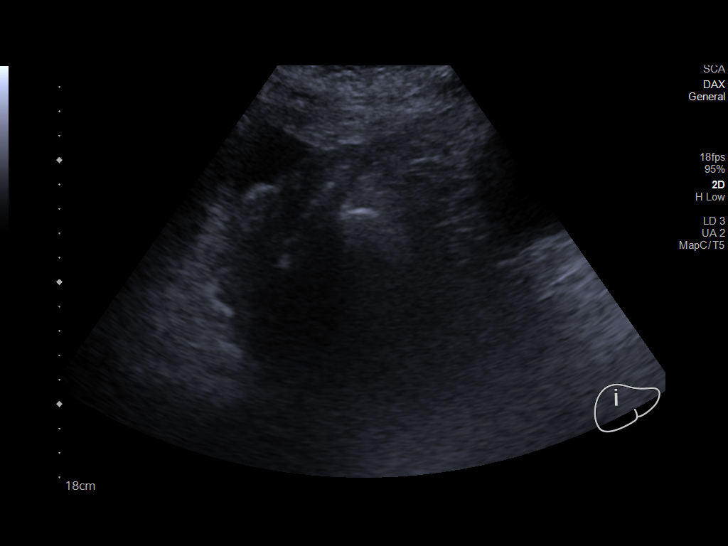
[im 29/39]
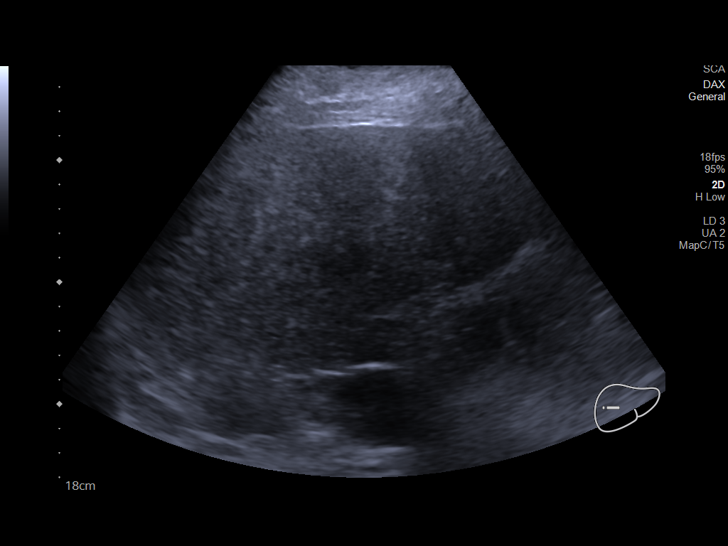
[im 32/39]
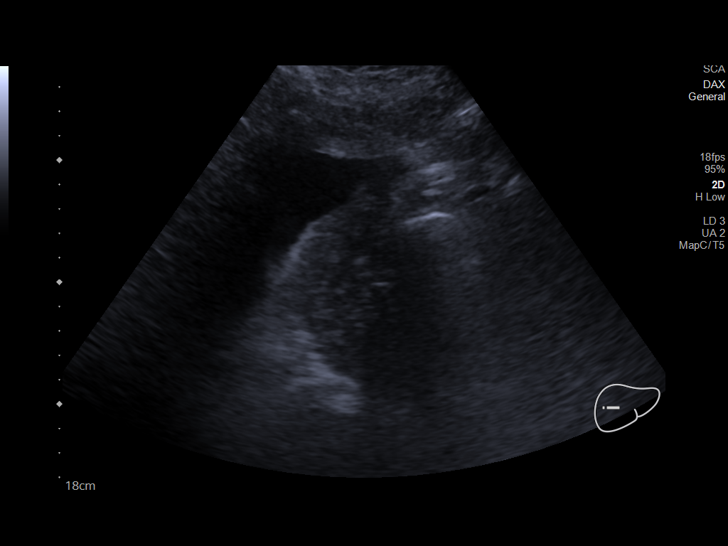
[im 35/39]
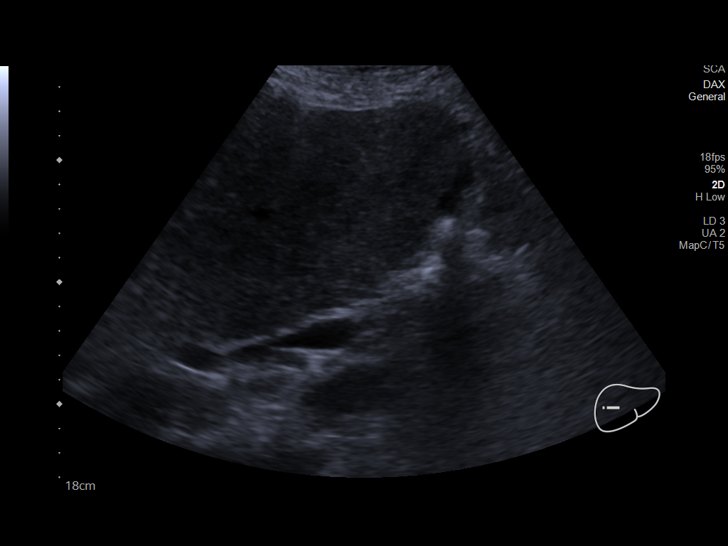
[im 39/39]
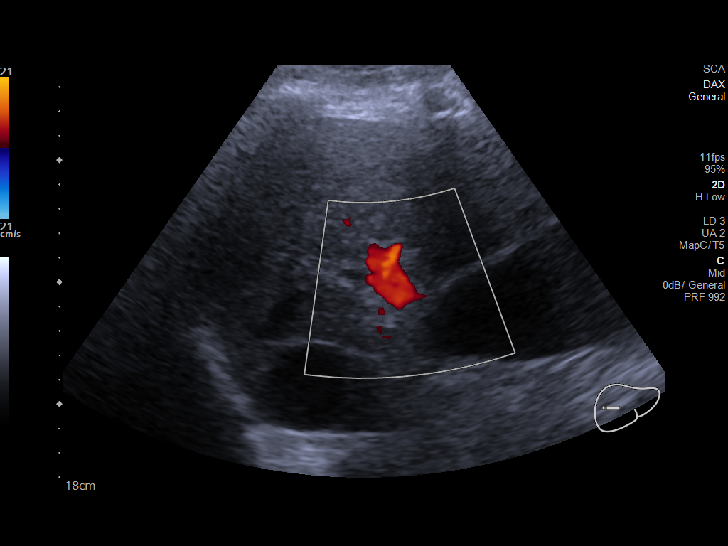

[13 of 25 positions shown; findings below may reference images not displayed]

FINDINGS: Gallbladder:

Gallbladder is moderately distended without wall thickening,
pericholecystic fluid or reported tenderness over the gallbladder.
No stones are visualized.

Common bile duct:

Diameter: 4.9 mm

Liver:

Markedly heterogeneous liver. There is a hypoechoic area,
potentially a vascular structure within the hepatic parenchyma seen
on image 34 of 40 but surrounded by areas of heterogeneity and mixed
echogenicity which is different from other areas of the liver,
perhaps related to variable acoustic attenuation due to background
nodular hepatic parenchyma. Hypoechoic area measuring approximately
12 mm. Limited assessment of the portal vein shows gross patency and
normal direction of flow

Other: Small volume ascites
IMPRESSION: Markedly heterogeneous hepatic parenchyma limits assessment,
findings are compatible with cirrhosis with question of focal lesion
in the RIGHT hepatic lobe with surrounding heterogeneity, perhaps
artifact and related to vessel but would suggest CT or MRI to
exclude underlying lesion and or infiltrative process.

Interval development of ascites since the prior study.

These results will be called to the ordering clinician or
representative by the Radiologist Assistant, and communication
documented in the PACS or [REDACTED].

## 2020-04-24 NOTE — Telephone Encounter (Signed)
Called the phone number provided for [facility]. Continuous ringing, unable to leave a message. No extension provided.

## 2020-04-24 NOTE — Telephone Encounter (Signed)
Rhonda with [facility] is calling to report patient's results for his ultrasound of the abdomen completed today. Please return call to discuss.  Phone#: [phone]

## 2020-04-25 ENCOUNTER — Telehealth: Payer: Self-pay | Admitting: General Practice

## 2020-04-25 NOTE — Telephone Encounter (Signed)
Just to let you know. No action needed.  Called radiology service back and received report from patient's abdominal ultrasound. You had already reviewed this result and made further recommendations.  Thanks,  Jossie Ng. Melissia Lahman NP-C    04/25/2020, 8:58 AM [place] [address] Office [phone] Fax [phone]

## 2020-04-25 NOTE — Telephone Encounter (Signed)
Darryl Velazquez from [facility] was following up

## 2020-04-30 ENCOUNTER — Telehealth: Payer: Self-pay | Admitting: Cardiology

## 2020-04-30 NOTE — Telephone Encounter (Signed)
Patient is following up regarding his appointment with [facility]. He states Dr. Terrial Rhodes nurse informed him that he needs to be seen sooner. Per patient, he was told that our office will contact [facility] in attempt to get patient in for a sooner appointment (current appointment is on 05/29/20). Do we have any updates for patient regarding this appointment? Please return call.

## 2020-05-01 NOTE — Telephone Encounter (Signed)
I called  GI this morning and requested a sooner appointment for this patient. The receptionist that I spoke with stated that they do not have any sooner appointments available as they are scheduling into January at this time. She does state that if Dr. [name] wanted to call Dr. Gerrit Heck that he might could assist in forcing the patient in as a "physician override". His page number is [phone].  I will route to Dr. [name] at this time.

## 2020-05-01 NOTE — Telephone Encounter (Signed)
Can someone please work on connecting with GI to get this patient a sooner appointment.  Thank you

## 2020-05-06 ENCOUNTER — Telehealth: Payer: Self-pay | Admitting: General Surgery

## 2020-05-06 NOTE — Telephone Encounter (Signed)
-----   Message from Randall, DO sent at 05/04/2020  9:10 AM EST ----- Regarding: RE: Help with getting patient in sooner Absolutely! Happy to get him in sooner than January. Will Heinkel,Can you please schedule this patient an appointment with me in the next week or so. Okay to [facility] clinic. Thank you. ----- Message ----- From: Berniece Salines, DO Sent: 05/01/2020  10:30 AM EST To: Lavena Bullion, DO Subject: Help with getting patient in sooner            Hello Dr. Bryan Lemma,    I am reaching out for your help. The attached patient needs GI attention. He had a history of fatty liver which I think has progressed to cirrhosis based on the imaging report.  I was hoping for a sooner appointment in January to get him evaluated. He came to me in heart failure he has got diastolic dysfunction but I think most of his issue is also from his liver as well.  Thanks  [place]

## 2020-05-06 NOTE — Telephone Encounter (Signed)
Called patient per Dr. [name] request to inform him that he will be contacted by gi this week. He verbally understood. No further questions.

## 2020-05-06 NOTE — Telephone Encounter (Signed)
Left a detailed message on the patients answering machine that Dr Tobb/Dr Bryan Lemma would like for the patient to have his appointment moved up. Advised the patient I scheduled him on 05/08/2020 at 8:20 he will need to arrive at 8am/

## 2020-05-07 NOTE — Telephone Encounter (Signed)
Shirlean Mylar confirmed that the patient will make the appointment for 05/08/2020 ta

## 2020-05-07 NOTE — Telephone Encounter (Signed)
-----   Message from [place], DO sent at 05/04/2020  9:10 AM EST ----- Regarding: RE: Help with getting patient in sooner Absolutely! Happy to get him in sooner than January. Judit Awad,Can you please schedule this patient an appointment with me in the next week or so. Okay to [facility] clinic. Thank you. ----- Message ----- From: Berniece Salines, DO Sent: 05/01/2020  10:30 AM EST To: Lavena Bullion, DO Subject: Help with getting patient in sooner            Hello Dr. Bryan Lemma,    I am reaching out for your help. The attached patient needs GI attention. He had a history of fatty liver which I think has progressed to cirrhosis based on the imaging report.  I was hoping for a sooner appointment in January to get him evaluated. He came to me in heart failure he has got diastolic dysfunction but I think most of his issue is also from his liver as well.  Thanks  [place]

## 2020-05-08 ENCOUNTER — Other Ambulatory Visit: Payer: Self-pay

## 2020-05-08 ENCOUNTER — Encounter: Payer: Self-pay | Admitting: Gastroenterology

## 2020-05-08 ENCOUNTER — Other Ambulatory Visit (INDEPENDENT_AMBULATORY_CARE_PROVIDER_SITE_OTHER): Payer: BC Managed Care – PPO

## 2020-05-08 ENCOUNTER — Ambulatory Visit (INDEPENDENT_AMBULATORY_CARE_PROVIDER_SITE_OTHER): Payer: BC Managed Care – PPO | Admitting: Gastroenterology

## 2020-05-08 VITALS — BP 134/66 | HR 61 | Ht 76.0 in | Wt 319.4 lb

## 2020-05-08 DIAGNOSIS — R748 Abnormal levels of other serum enzymes: Secondary | ICD-10-CM | POA: Diagnosis not present

## 2020-05-08 DIAGNOSIS — K7460 Unspecified cirrhosis of liver: Secondary | ICD-10-CM | POA: Diagnosis not present

## 2020-05-08 DIAGNOSIS — R110 Nausea: Secondary | ICD-10-CM

## 2020-05-08 DIAGNOSIS — R600 Localized edema: Secondary | ICD-10-CM

## 2020-05-08 DIAGNOSIS — R630 Anorexia: Secondary | ICD-10-CM | POA: Diagnosis not present

## 2020-05-08 DIAGNOSIS — Z800 Family history of malignant neoplasm of digestive organs: Secondary | ICD-10-CM

## 2020-05-08 DIAGNOSIS — E8809 Other disorders of plasma-protein metabolism, not elsewhere classified: Secondary | ICD-10-CM

## 2020-05-08 LAB — IBC + FERRITIN
Ferritin: 419.7 ng/mL — ABNORMAL HIGH (ref 22.0–322.0)
Iron: 161 ug/dL (ref 42–165)
Saturation Ratios: 62.5 % — ABNORMAL HIGH (ref 20.0–50.0)
Transferrin: 184 mg/dL — ABNORMAL LOW (ref 212.0–360.0)

## 2020-05-08 LAB — CBC WITH DIFFERENTIAL/PLATELET
Basophils Absolute: 0 10*3/uL (ref 0.0–0.1)
Basophils Relative: 0.2 % (ref 0.0–3.0)
Eosinophils Absolute: 0.4 10*3/uL (ref 0.0–0.7)
Eosinophils Relative: 8.3 % — ABNORMAL HIGH (ref 0.0–5.0)
HCT: 41.3 % (ref 39.0–52.0)
Hemoglobin: 13.8 g/dL (ref 13.0–17.0)
Lymphocytes Relative: 26.6 % (ref 12.0–46.0)
Lymphs Abs: 1.4 10*3/uL (ref 0.7–4.0)
MCHC: 33.4 g/dL (ref 30.0–36.0)
MCV: 88 fl (ref 78.0–100.0)
Monocytes Absolute: 0.8 10*3/uL (ref 0.1–1.0)
Monocytes Relative: 16.6 % — ABNORMAL HIGH (ref 3.0–12.0)
Neutro Abs: 2.5 10*3/uL (ref 1.4–7.7)
Neutrophils Relative %: 48.3 % (ref 43.0–77.0)
Platelets: 174 10*3/uL (ref 150.0–400.0)
RBC: 4.69 Mil/uL (ref 4.22–5.81)
RDW: 15.8 % — ABNORMAL HIGH (ref 11.5–15.5)
WBC: 5.1 10*3/uL (ref 4.0–10.5)

## 2020-05-08 LAB — PROTIME-INR
INR: 1.3 ratio — ABNORMAL HIGH (ref 0.8–1.0)
Prothrombin Time: 15 s — ABNORMAL HIGH (ref 9.6–13.1)

## 2020-05-08 NOTE — Patient Instructions (Signed)
If you are age 59 or older, your body mass index should be between 23-30. Your Body mass index is 38.88 kg/m. If this is out of the aforementioned range listed, please consider follow up with your Primary Care Provider.  If you are age 38 or younger, your body mass index should be between 19-25. Your Body mass index is 38.88 kg/m. If this is out of the aformentioned range listed, please consider follow up with your Primary Care Provider.   Scheduling at [facility] will call you to schedule your MRI.   Please go to the lab at [facility] ([place] You will need to go to level "B", you do not need an appointment for this. Hours available are 7:30 am - 4:30 pm.    You have been scheduled for a liver biopsy at [facility] on 05/16/20 at 11am. Nothing to eat or drink after 7am. You will need a driver with you at this appointment.   You have been scheduled for an endoscopy. Please follow written instructions given to you at your visit today. If you use inhalers (even only as needed), please bring them with you on the day of your procedure.  Follow up in 3 months  It was a pleasure to see you today!  Vito Cirigliano, D.O.

## 2020-05-08 NOTE — Progress Notes (Signed)
Chief Complaint: Elevated liver enzymes, suspected cirrhosis, loss of appetite  Referring Provider:     Berniece Salines, DO  HPI:    Darryl Velazquez is a 59 y.o. 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  Was seen by Dr. [name] in the Cardiology clinic on 04/19/2020 for lower extremity edema.  Was started on Lasix 40 mg/day and 80 mg QOD.  TTE unremarkable per patient. Referred to the GI clinic due to elevated liver enzymes and imaging suspicious for cirrhosis as outlined below.  He states he had a liver bx at [facility] ~5 years ago and was told fatty liver. Was also told he had Hereditary Hemochromatosis at the time and seen by Hematology in [place], but no phlebotomy recommended. No records available for review. LE edema started about 2 years ago, treated with compression device, compression stockings, and more recently started on diuretics as above.  Very rare EtOH.  No known history of viral hepatitis.  No known family history of liver disease.  Separately with decreased appetite, nausea without emesis.  Labs from 04/19/2020: -AST/ALT 88/58, ALP 183, T bili 1.8, direct bili 0.66, protein/albumin 6.6/3.0 -BUN/creatinine 11/0.75, sodium 140, K3.3 -Ferritin 960  -[facility] [place] (04/24/2020): Markedly heterogenous liver with appearance compatible with cirrhosis, hypoechoic area, potentially a vascular structure measuring 12 mm.  Small volume ascites.  Recommend CT or MRI.  -03/02/2020 [facility]): -AST/ALT 55/32, ALP 115, T bili 1.8, albumin 2.9 -Normal BUN/creatinine, sodium 136  -09/15/2019: -PT/INR 13/1.22 -H/H 13.9/41.6, PLT 138 -Abdominal ultrasound: Hepatomegaly with cirrhotic liver morphology.  No suspicious hepatic lesions.  -CT  angio (12/16/2018): Mild distention of GB, no focal liver abnormality but concern for nodular liver contour.  Normal pancreas, normal GI tract.  Mildly prominent LN near gastrohepatic ligament similar to previous study.  Mild large amount of lymph nodes in the lower pelvis and inguinal region.  -Appears he has had mildly elevated liver enzymes since 03/2016 (AST/ALT 56/67) with normal T bili 0.6 at that time.  Colonoscopy x2, last being ~3 years ago in [place] and n/f polyps, with plan to repeat 5 years.  No records for review.  No previous EGD.   FHx: - Father with stomach CA in his 29's   Past Medical History:  Diagnosis Date  . Cerebrovascular disease   . Chronic venous insufficiency   . Dyslipidemia   . Hemochromatosis, hereditary ([place])   . Medication intolerance   . Morbid obesity ([place])   . Nicotine dependence   . Nonalcoholic steatohepatitis   . Peripheral vascular disease ([place])   . Pneumothorax, traumatic   . Prostate cancer ([place])   . 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 Sleep apnea   . Stroke [facility])    2 prior to age 89.  . Venous stasis dermatitis of both lower extremities      Past Surgical History:  Procedure Laterality Date  . COLONOSCOPY  09/24/2017  . KNEE SURGERY Right   . PENILE PROSTHESIS IMPLANT  10/11/2019  . PROSTATE BIOPSY  01/29/2016   Transurethral  . prostatectomy  04/10/2016   Abdominal   Family History  Problem Relation Age of Onset  . Diabetes Father   . Kidney failure Father   . Diabetes Sister   .  Hypertension Sister   . Colon cancer Neg Hx   . Esophageal cancer Neg Hx    Social History   Tobacco Use  . Smoking status: Former Smoker    Quit date: 11/2018    Years since quitting: 1.5  . Smokeless tobacco: Never Used  Vaping Use  . Vaping Use: Former  Substance Use Topics  . Alcohol use: Not Currently  . Drug use: Never   Current Outpatient Medications  Medication Sig Dispense Refill  . Aspirin Buf,CaCarb-MgCarb-MgO, 81 MG TABS  Take by mouth.    . furosemide (LASIX) 40 MG tablet Take 1 tablet (40 mg total) by mouth as directed. Increase Lasix to 40 mg daily on Mondays, Wednesdays , Fridays and Sundays Take Lasix twice a day on Tuesdays, Thursdays and Saturdays 270 tablet 3  . ibuprofen (ADVIL) 400 MG tablet Take 400 mg by mouth every 6 (six) hours as needed.    Marland Kitchen NAPROXEN PO Take 220 mg by mouth 2 (two) times daily with a meal.    . potassium chloride SA (KLOR-CON) 20 MEQ tablet Take 1 tablet (20 mEq total) by mouth daily. 90 tablet 3  . rosuvastatin (CRESTOR) 10 MG tablet Take 10 mg by mouth daily.     Current Facility-Administered Medications  Medication Dose Route Frequency Provider Last Rate Last Admin  . triamcinolone acetonide (KENALOG) 10 MG/ML injection 10 mg  10 mg Other Once Landis Martins, DPM       No Known Allergies   Review of Systems: All systems reviewed and negative except where noted in HPI.     Physical Exam:    Wt Readings from Last 3 Encounters:  05/08/20 (!) 319 lb 6 oz (144.9 kg)  04/19/20 (!) 317 lb 9.6 oz (144.1 kg)    BP 134/66   Pulse 61   Ht 6' 4"  (1.93 m)   Wt (!) 319 lb 6 oz (144.9 kg)   BMI 38.88 kg/m  Constitutional:  Pleasant, in no acute distress. Psychiatric: Normal mood and affect. Behavior is normal. EENT: Pupils normal.  Conjunctivae are normal. No scleral icterus. Cardiovascular: Normal rate, regular rhythm. No edema Pulmonary/chest: Effort normal and breath sounds normal. No wheezing, rales or rhonchi. Abdominal: Soft, nondistended, nontender. Bowel sounds active throughout. There are no masses palpable. Neurological: Alert and oriented to person place and time. Skin: Skin is warm and dry. No rashes noted. Ext: Bilateral LE edema, L> R   ASSESSMENT AND PLAN;   1) Elevated liver enzymes 2) Abnormal abdominal imaging 3) suspected cirrhosis based on labs and imaging 4) Elevated extremity edema 5) Ascites on imaging 6) Hyperbilirubinemia  59 year old  male with mildly elevated liver enzymes dating back to at least 2017 (no labs prior to that for review), with normal T bili at that time, but seems to have up trended on serial labs since then, along with mild hypoalbuminemia, all suspicious for cirrhosis.  Additionally, CT in 2020 and ultrasound x2 this year all with cirrhotic appearing liver.  Plan as follows:  -MRI liver to further characterize hypoechoic lesion noted on most recent ultrasound.  May be able to get a sense for hepatic iron concentration -Check HFE gene, repeat iron panel and ferritin -ANA, AMA, ASMA, alpha-1 antitrypsin, ceruloplasmin, immunoglobulin panel, viral hepatitis panel -Check INR -EGD for EV screening along with evaluation of UGI symptoms as below -Liver biopsy  7) Decreased appetite 8) Nausea without emesis 9) Family history of stomach cancer -Vague upper GI symptoms over the last several months. -EGD  to evaluate for mucosal/luminal pathology  The indications, risks, and benefits of EGD were explained to the patient in detail. Risks include but are not limited to bleeding, perforation, adverse reaction to medications, and cardiopulmonary compromise. Sequelae include but are not limited to the possibility of surgery, hositalization, and mortality. The patient verbalized understanding and wished to proceed. All questions answered, referred to scheduler. Further recommendations pending results of the exam.     Lavena Bullion, DO, FACG  05/08/2020, 8:30 AM   [address], [place], *

## 2020-05-10 LAB — ANA: Anti Nuclear Antibody (ANA): NEGATIVE

## 2020-05-10 LAB — IGA: Immunoglobulin A: 856 mg/dL — ABNORMAL HIGH (ref 47–310)

## 2020-05-10 LAB — IGM: IgM, Serum: 195 mg/dL (ref 50–300)

## 2020-05-10 LAB — AFP TUMOR MARKER: AFP-Tumor Marker: 12.7 ng/mL — ABNORMAL HIGH (ref <6.1)

## 2020-05-10 LAB — HEPATITIS A ANTIBODY, TOTAL: Hepatitis A AB,Total: NONREACTIVE

## 2020-05-10 LAB — HEPATITIS B SURFACE ANTIGEN: Hepatitis B Surface Ag: NONREACTIVE

## 2020-05-10 LAB — MITOCHONDRIAL ANTIBODIES: Mitochondrial M2 Ab, IgG: 43.2 U — ABNORMAL HIGH

## 2020-05-10 LAB — IGG: IgG (Immunoglobin G), Serum: 2209 mg/dL — ABNORMAL HIGH (ref 600–1640)

## 2020-05-10 LAB — CERULOPLASMIN: Ceruloplasmin: 21 mg/dL (ref 18–36)

## 2020-05-10 LAB — ANTI-SMOOTH MUSCLE ANTIBODY, IGG: Actin (Smooth Muscle) Antibody (IGG): <20 U (ref <20)

## 2020-05-10 LAB — HEPATITIS B SURFACE ANTIBODY,QUALITATIVE: Hep B S Ab: NONREACTIVE

## 2020-05-10 LAB — HEPATITIS C ANTIBODY
Hepatitis C Ab: NONREACTIVE
SIGNAL TO CUT-OFF: 0.04 (ref <1.00)

## 2020-05-10 LAB — ALPHA-1-ANTITRYPSIN: A-1 Antitrypsin, Ser: 152 mg/dL (ref 83–199)

## 2020-05-15 ENCOUNTER — Telehealth: Payer: Self-pay | Admitting: General Surgery

## 2020-05-15 ENCOUNTER — Other Ambulatory Visit: Payer: Self-pay | Admitting: Student

## 2020-05-15 DIAGNOSIS — R110 Nausea: Secondary | ICD-10-CM

## 2020-05-15 DIAGNOSIS — R600 Localized edema: Secondary | ICD-10-CM

## 2020-05-15 DIAGNOSIS — K760 Fatty (change of) liver, not elsewhere classified: Secondary | ICD-10-CM

## 2020-05-15 DIAGNOSIS — K7460 Unspecified cirrhosis of liver: Secondary | ICD-10-CM

## 2020-05-15 NOTE — Telephone Encounter (Signed)
-----   Message from [place], DO sent at 05/15/2020  8:38 AM EST ----- Extended serologic work-up for liver disease notable for the following:-Elevated IgA (856), elevated IgG (2209), elevated/positive antimitochondrial antibody (43.2), and elevated AFP (12.7)-Mildly elevated INR at 1.3-While the ferritin has improved to 419 (previously 968), the elevated iron saturation at 62.5% is still suspicious for iron overloadPlan for the following:-Proceed with liver biopsy as previously ordered-Will follow up on pending hemochromatosis lab-Proceed with MRI liver as previously ordered.  If possible, please discuss with radiology about adding MRCP protocol to that three-phase liver study-Check HIV-Proceed with EGD for EV screening as previously planned

## 2020-05-15 NOTE — Telephone Encounter (Addendum)
Per Peggy the patient needs to be scheduled at [facility] due to weight. Also need to call back with the type of penile implant so that [facility] can research if they can actually do the procedure. Ask to speak with Aniceto Boss per [facility]

## 2020-05-15 NOTE — Telephone Encounter (Signed)
Error

## 2020-05-15 NOTE — Telephone Encounter (Signed)
Per OP report from [facility] on 10/27/2019 they used the following penile implant:  An AMS LGX penile prosthesis was chosen. The size was 18 cm with 2.5 cm of rear tip extension bilaterally. The implant was placed distally with the assistance of a Furlow inserter and Naomi needle and proximally with the assistance of the device from the accessory kit.   Contacted central and spoke with Dorothea Ogle he was sending a message to Aniceto Boss to contact me to schedule.

## 2020-05-15 NOTE — Telephone Encounter (Signed)
Spoke with Shanon Brow and Shirlean Mylar regarding test results and what Dr Bryan Lemma would like to do going forward. Patient is onboard with all testing and verbalized understanding. The patient is not working at this time and has had to start COBRA benefits through [employer] are concerned about pre-authorizations right now and would like to have the labwork and MRI done after this week.

## 2020-05-15 NOTE — Telephone Encounter (Signed)
Tasha from central scheduling called me back and the informatiion regarding the patients implant was given to her. She in turn will call radiology and they will determine if the patient is eligible for the mri/mrcp. If she gets approval she will call the patient to schedule. ta

## 2020-05-16 ENCOUNTER — Telehealth: Payer: Self-pay | Admitting: General Surgery

## 2020-05-16 ENCOUNTER — Other Ambulatory Visit: Payer: Self-pay | Admitting: Gastroenterology

## 2020-05-16 ENCOUNTER — Ambulatory Visit (HOSPITAL_COMMUNITY)
Admission: RE | Admit: 2020-05-16 | Discharge: 2020-05-16 | Disposition: A | Discharge status: Home / Self Care | Payer: BC Managed Care – PPO | Source: Ambulatory Visit | Attending: Gastroenterology | Admitting: Gastroenterology

## 2020-05-16 ENCOUNTER — Other Ambulatory Visit: Payer: Self-pay | Admitting: Physician Assistant

## 2020-05-16 ENCOUNTER — Other Ambulatory Visit: Payer: Self-pay

## 2020-05-16 DIAGNOSIS — Z7982 Long term (current) use of aspirin: Secondary | ICD-10-CM | POA: Insufficient documentation

## 2020-05-16 DIAGNOSIS — Z9079 Acquired absence of other genital organ(s): Secondary | ICD-10-CM | POA: Diagnosis not present

## 2020-05-16 DIAGNOSIS — Z8546 Personal history of malignant neoplasm of prostate: Secondary | ICD-10-CM | POA: Diagnosis not present

## 2020-05-16 DIAGNOSIS — I739 Peripheral vascular disease, unspecified: Secondary | ICD-10-CM | POA: Diagnosis not present

## 2020-05-16 DIAGNOSIS — Z6838 Body mass index (BMI) 38.0-38.9, adult: Secondary | ICD-10-CM | POA: Insufficient documentation

## 2020-05-16 DIAGNOSIS — K739 Chronic hepatitis, unspecified: Secondary | ICD-10-CM | POA: Diagnosis not present

## 2020-05-16 DIAGNOSIS — E669 Obesity, unspecified: Secondary | ICD-10-CM | POA: Diagnosis not present

## 2020-05-16 DIAGNOSIS — R600 Localized edema: Secondary | ICD-10-CM | POA: Diagnosis not present

## 2020-05-16 DIAGNOSIS — Z8673 Personal history of transient ischemic attack (TIA), and cerebral infarction without residual deficits: Secondary | ICD-10-CM | POA: Insufficient documentation

## 2020-05-16 DIAGNOSIS — K7460 Unspecified cirrhosis of liver: Secondary | ICD-10-CM | POA: Insufficient documentation

## 2020-05-16 DIAGNOSIS — K760 Fatty (change of) liver, not elsewhere classified: Secondary | ICD-10-CM | POA: Diagnosis not present

## 2020-05-16 DIAGNOSIS — R110 Nausea: Secondary | ICD-10-CM

## 2020-05-16 DIAGNOSIS — R945 Abnormal results of liver function studies: Secondary | ICD-10-CM | POA: Diagnosis not present

## 2020-05-16 DIAGNOSIS — Z79899 Other long term (current) drug therapy: Secondary | ICD-10-CM | POA: Insufficient documentation

## 2020-05-16 DIAGNOSIS — Z87891 Personal history of nicotine dependence: Secondary | ICD-10-CM | POA: Diagnosis not present

## 2020-05-16 LAB — CBC
HCT: 39.3 % (ref 39.0–52.0)
Hemoglobin: 12.9 g/dL — ABNORMAL LOW (ref 13.0–17.0)
MCH: 28.8 pg (ref 26.0–34.0)
MCHC: 32.8 g/dL (ref 30.0–36.0)
MCV: 87.7 fL (ref 80.0–100.0)
Platelets: 182 10*3/uL (ref 150–400)
RBC: 4.48 MIL/uL (ref 4.22–5.81)
RDW: 16.1 % — ABNORMAL HIGH (ref 11.5–15.5)
WBC: 5 10*3/uL (ref 4.0–10.5)
nRBC: 0 % (ref 0.0–0.2)

## 2020-05-16 LAB — PROTIME-INR
INR: 1.6 — ABNORMAL HIGH (ref 0.8–1.2)
Prothrombin Time: 18.1 seconds — ABNORMAL HIGH (ref 11.4–15.2)

## 2020-05-16 LAB — SARS CORONAVIRUS 2 (TAT 6-24 HRS): SARS Coronavirus 2: NEGATIVE

## 2020-05-16 LAB — HEMOCHROMATOSIS DNA-PCR(C282Y,H63D)

## 2020-05-16 IMAGING — US US BIOPSY CORE LIVER
1 series · 7 of 7 positions shown · non-contrast
Comparison: none

CLINICAL DATA: Cirrhosis.

EXAM:
ULTRASOUND-GUIDED CORE LIVER BIOPSY
TECHNIQUE: An ultrasound guided liver biopsy was thoroughly discussed with the
patient and questions were answered. The benefits, risks,
alternatives, and complications were also discussed. The patient
understands and wishes to proceed with the procedure. A verbal as
well as written consent was obtained.

[Series 1: us biopsy (liver) · 7 of 7 slices shown]
[im 1/7]
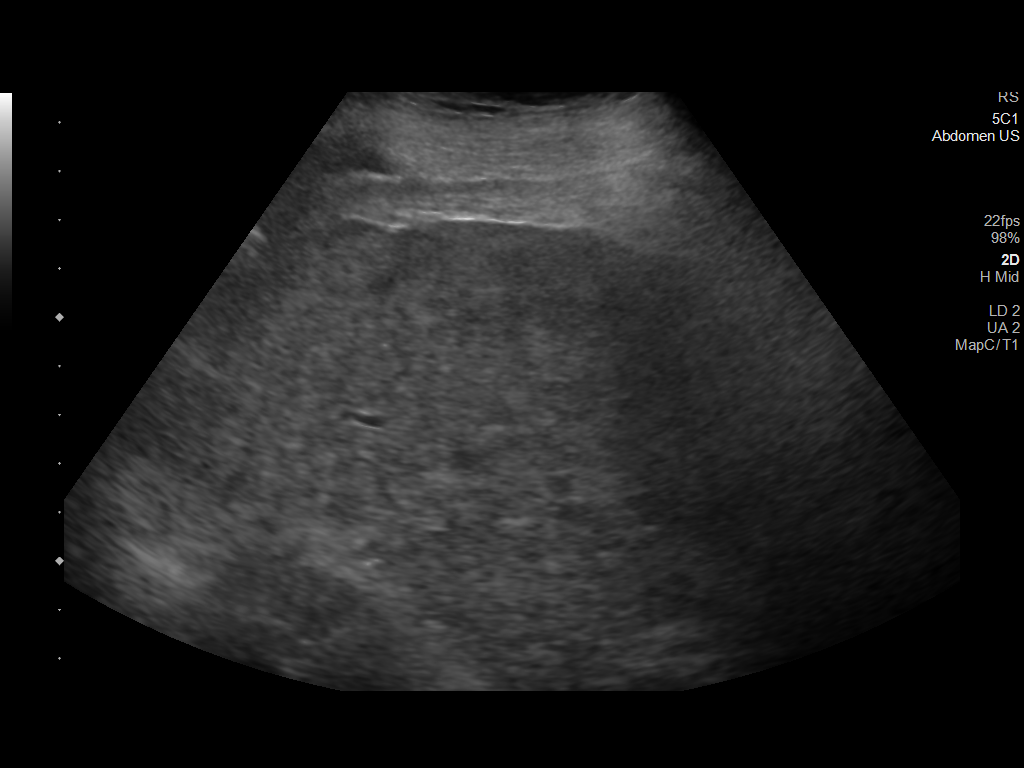
[im 2/7]
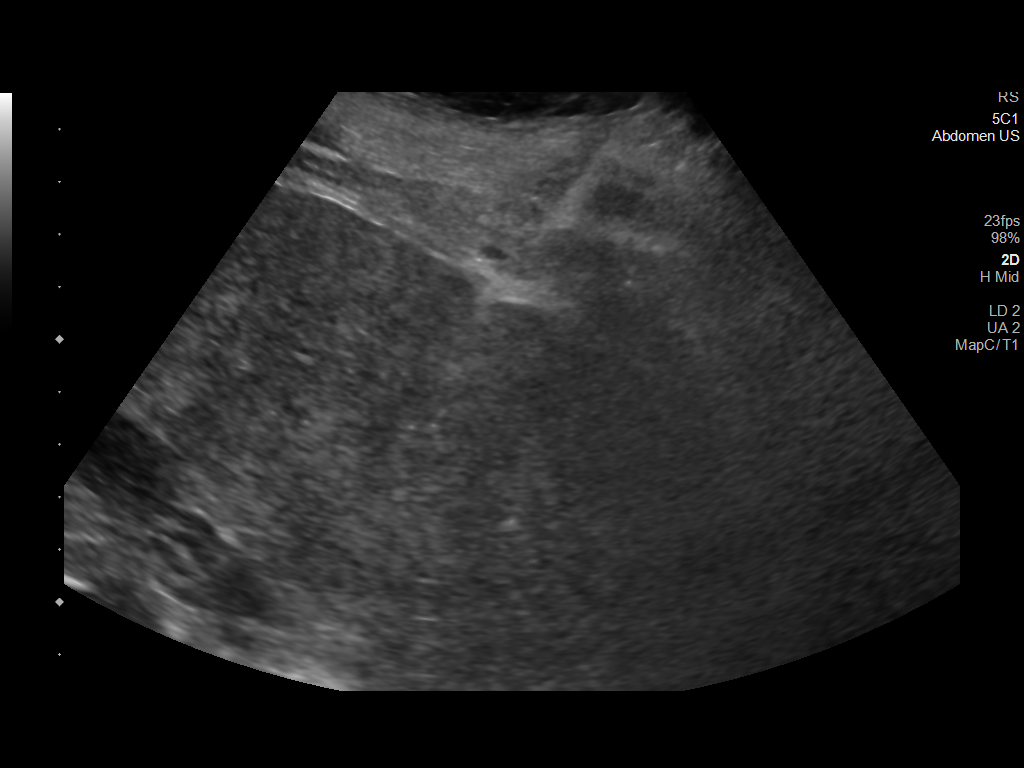
[im 3/7]
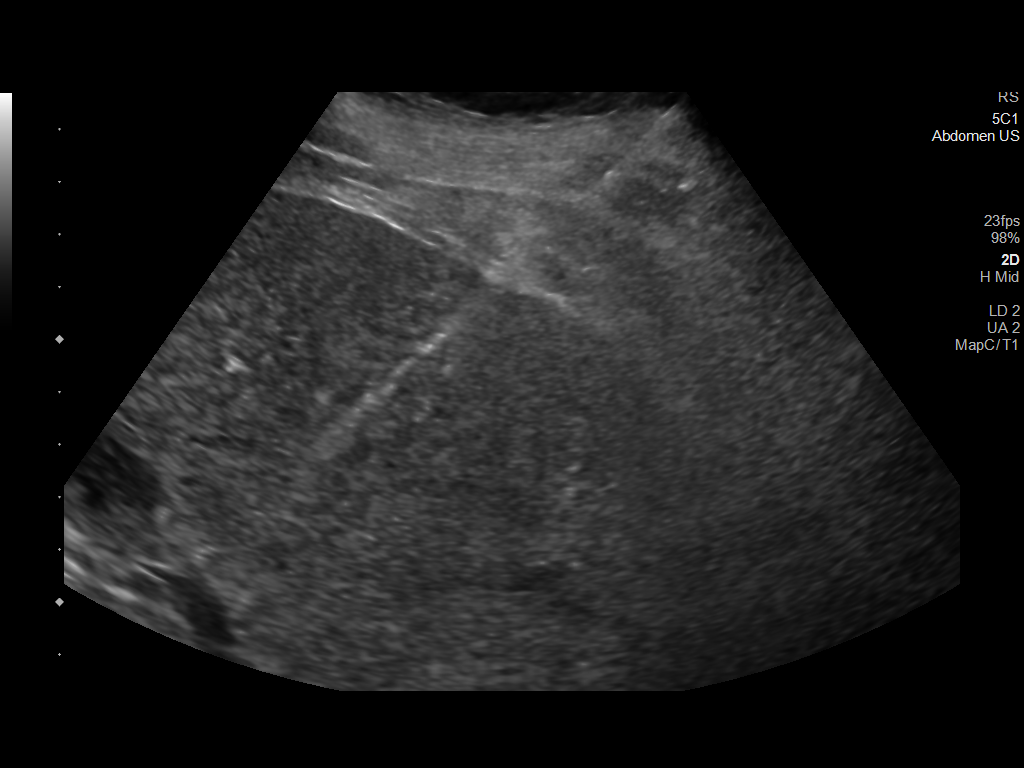
[im 4/7]
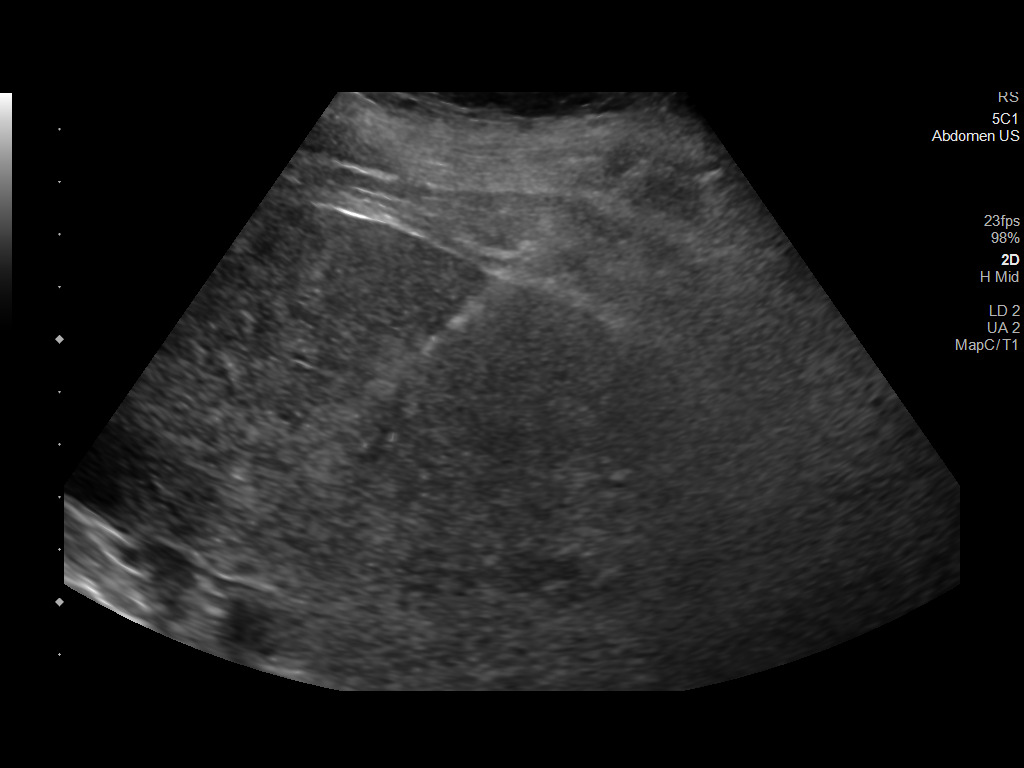
[im 5/7]
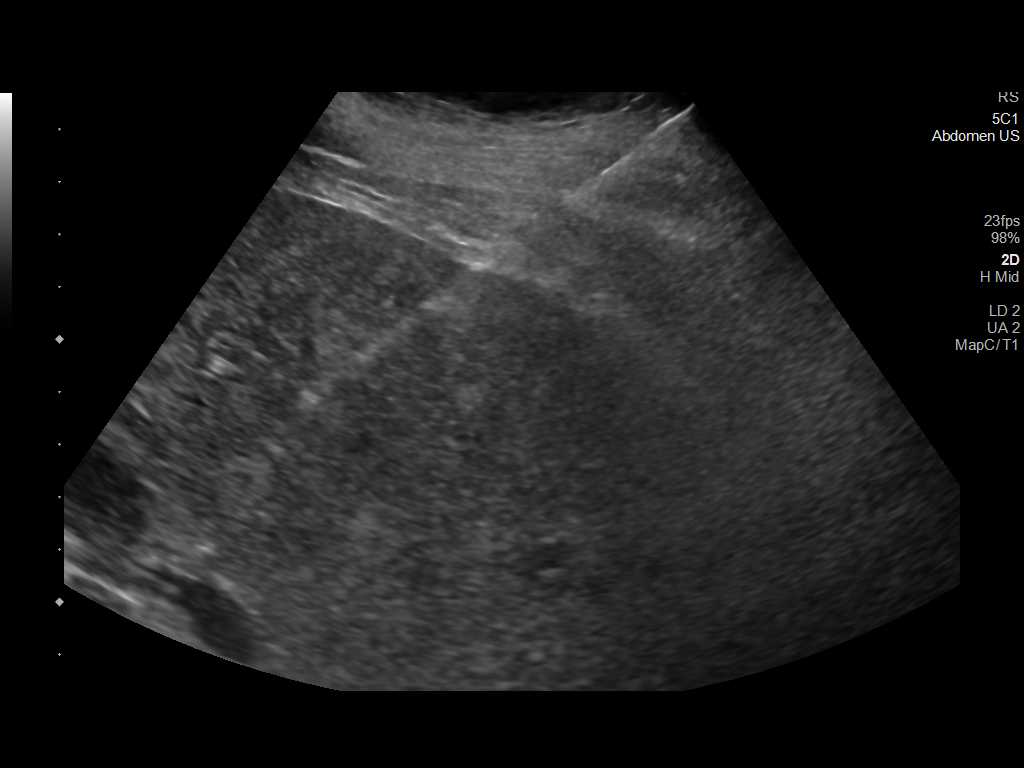
[im 6/7]
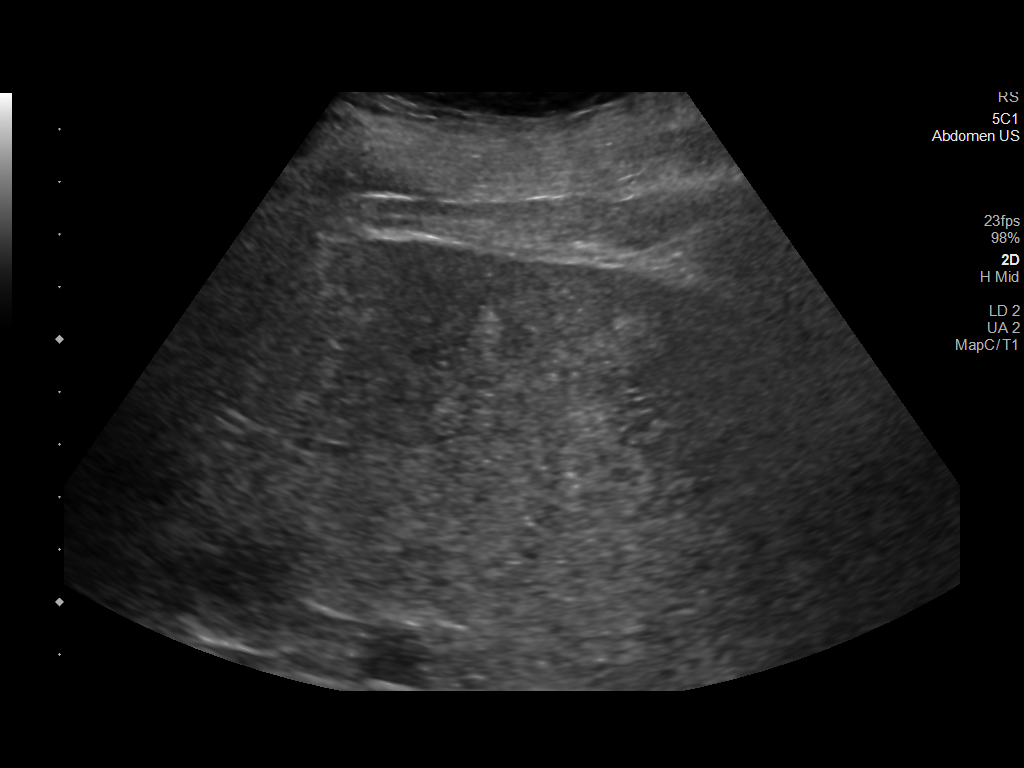
[im 7/7]
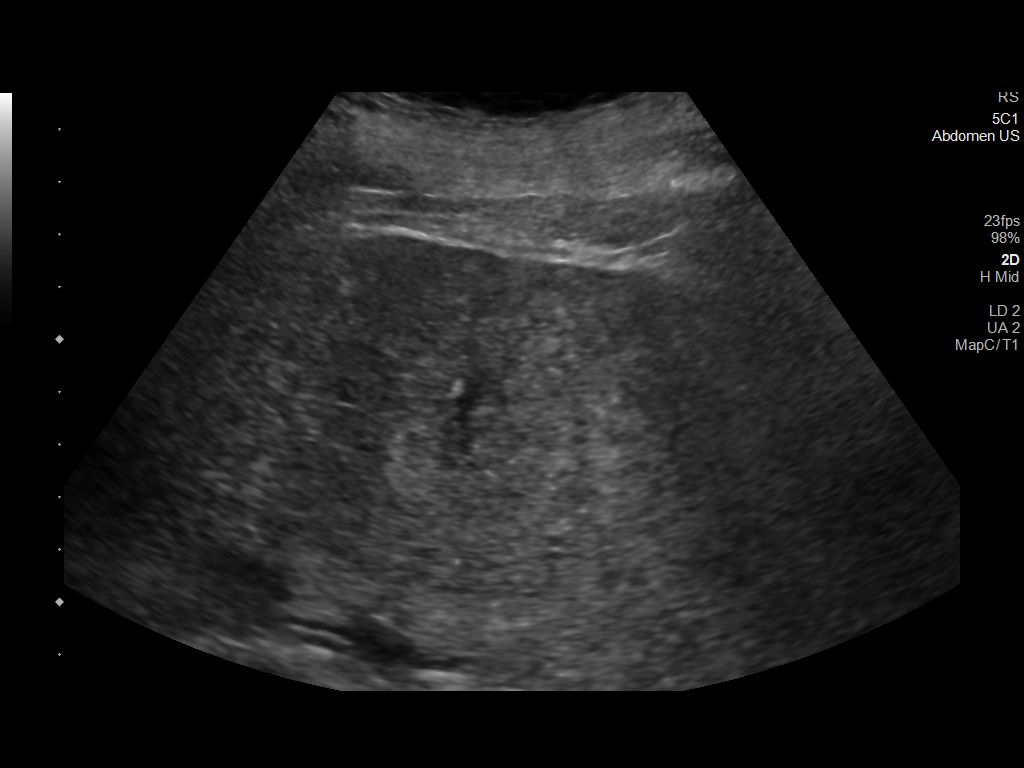

[7 of 7 positions shown; findings below may reference images not displayed]

Survey ultrasound of the liver was performed and an appropriate skin
entry site was determined. Skin site was marked, prepped with
chlorhexidine, and draped in usual sterile fashion, and infiltrated
locally with 1% lidocaine.

Intravenous Fentanyl [8S] and Versed 1mg were administered as
conscious sedation during continuous monitoring of the patient's
level of consciousness and physiological / cardiorespiratory status
by the radiology RN, with a total moderate sedation time of 10
minutes. A 17 gauge trocar needle was advanced under ultrasound
guidance into the liver. 3 solid-appearing coaxial [8S] core
samples were then obtained through the guide needle. The guide
needle was removed. Post procedure scans demonstrate no apparent
complication.

COMPLICATIONS:
COMPLICATIONS
None immediate
FINDINGS: Survey ultrasound of the liver demonstrates coarse echotexture
acute, no focal lesion or biliary ductal dilatation. Representative
core biopsy samples obtained as above.
IMPRESSION: 1. Technically successful ultrasound guided core liver biopsy.

## 2020-05-16 MED ORDER — HYDROCODONE-ACETAMINOPHEN 5-325 MG PO TABS: 1.0000 | ORAL_TABLET | ORAL | Status: DC | PRN

## 2020-05-16 MED ORDER — FENTANYL CITRATE (PF) 100 MCG/2ML IJ SOLN
INTRAMUSCULAR | Status: AC | PRN
Start: 2020-05-16 — End: 2020-05-16
  Administered 2020-05-16: 50 ug via INTRAVENOUS

## 2020-05-16 MED ORDER — FENTANYL CITRATE (PF) 100 MCG/2ML IJ SOLN
INTRAMUSCULAR | Status: AC
  Filled 2020-05-16: qty 2

## 2020-05-16 MED ORDER — MIDAZOLAM HCL 2 MG/2ML IJ SOLN
INTRAMUSCULAR | Status: AC | PRN
  Administered 2020-05-16: 1 mg via INTRAVENOUS

## 2020-05-16 MED ORDER — GELATIN ABSORBABLE 12-7 MM EX MISC
CUTANEOUS | Status: AC
  Filled 2020-05-16: qty 1

## 2020-05-16 MED ORDER — LIDOCAINE HCL (PF) 1 % IJ SOLN
INTRAMUSCULAR | Status: AC
  Filled 2020-05-16: qty 30

## 2020-05-16 MED ORDER — MIDAZOLAM HCL 2 MG/2ML IJ SOLN
INTRAMUSCULAR | Status: AC
  Filled 2020-05-16: qty 2

## 2020-05-16 MED ORDER — SODIUM CHLORIDE 0.9 % IV SOLN: INTRAVENOUS | Status: DC

## 2020-05-16 NOTE — Discharge Instructions (Signed)
Liver Biopsy  The liver is a large organ in the upper right side of the abdomen. A liver biopsy is a procedure in which a tissue sample is taken from the liver and examined under a microscope. There are three types of liver biopsies:  Percutaneous. A needle is used to remove a sample through an incision in your abdomen.  Laparoscopic. Several incisions are made in the abdomen. A sample is removed with the help of a tiny camera.  Transjugular. An incision is made in your neck in the area of the jugular vein. A sample is removed through a small flexible tube that is passed down the blood vessel and into your liver. Tell a health care provider about:  Any allergies you have.  All medicines you are taking, including vitamins, herbs, eye drops, creams, and over-the-counter medicines.  Any problems you or family members have had with anesthetic medicines.  Any blood disorders you have.  Any surgeries you have had.  Any medical conditions you have.  Whether you are pregnant or may be pregnant. What are the risks? Generally, this is a safe procedure. However, problems can occur and include:  Bleeding.  Infection.  Bruising.  Pain.  Injury to nearby organs or tissues, such as nerves, gallbladder, liver, or lungs. What happens before the procedure? Eating and drinking restrictions  You may be asked not to drink or eat for 6-8 hours before the liver biopsy. You may be allowed to eat a light breakfast. Talk to your health care provider about when you should stop eating and drinking. Medicines Ask your health care provider about:  Changing or stopping your regular medicines. This is especially important if you are taking diabetes medicines or blood thinners.  Taking medicines such as aspirin and ibuprofen. These medicines can thin your blood. Do not take these medicines unless your health care provider tells you to take them.  Taking over-the-counter medicines, vitamins, herbs, and  supplements. General instructions  Do not use any products that contain nicotine or tobacco, such as cigarettes and e-cigarettes. If you need help quitting, ask your health care provider.  Plan to have someone take you home from the hospital or clinic.  Plan to have a responsible adult care for you for at least 24 hours after you leave the hospital or clinic. This is important.  You may have blood or urine tests.  Ask your health care provider what steps will be taken to prevent infection. These may include: ? Removing hair at the surgery site. ? Washing skin with a germ-killing soap. ? Taking antibiotic medicine. What happens during the procedure?  An IV will be inserted into one of your veins. ? You will be given one or more of the following: ? A medicine to help you relax (sedative). ? A medicine to numb the area (local anesthetic). ? A medicine to make you fall asleep (general anesthetic).  Your health care provider will use one of the following procedures to remove samples from your liver. These procedures may vary among health care providers and hospitals. Percutaneous liver biopsy  You will lie on your back, with your right hand over your head.  A health care provider will locate your liver by tapping and pressing on the right side of your abdomen, or by using an ultrasound or CT scan.  A local anesthetic will be used to numb an area at the bottom of your last right rib.  A small incision will be made in the numbed area.  A biopsy needle will be inserted into the incision.  Several samples of liver tissue will be taken. You will be asked to hold your breath as each sample is taken.  The incision will be closed with stitches (sutures).  A bandage (dressing) may be placed over the incision. Laparoscopic liver biopsy  You will lie on your back.  Several small incisions will be made in your abdomen.  Your health care provider will pass a tiny camera through one  incision. The camera will allow the liver to be viewed on a TV monitor in the operating room.  Tools will be passed through the other incision or incisions.  Samples of the liver will be removed using the tools.  The incisions will be closed with stitches (sutures).  A bandage (dressing) may be placed over the incisions. Transjugular liver biopsy  You will lie on your back on an X-ray table, with your head turned to your left.  An area on your neck, just over your jugular vein, will be numbed.  An incision will be made in the numbed area.  A tiny tube will be inserted through the incision. The tube will be passed into the jugular vein to a blood vessel in the liver called the hepatic vein.  A dye will be injected through the tube.  X-rays will be taken. The dye will make the blood vessels in the liver light up on the X-rays.  The biopsy needle will be placed through the tube until it reaches the liver.  Samples of liver tissue will be taken with the biopsy needle.  The needle and the tube will be removed.  The incision will be closed with stitches (sutures).  A bandage (dressing) may be placed over the incision. What happens after the procedure?  Your blood pressure, heart rate, breathing rate, and blood oxygen level will be monitored until you leave the hospital or clinic.  You will be asked to rest quietly for 2-4 hours or longer.  You will be closely monitored for bleeding from the biopsy site.  You may be allowed to go home when the medicines have worn off and you can walk, drink, eat, and use the bathroom. Summary  A liver biopsy is a procedure in which a tissue sample is taken from the liver and examined under a microscope.  This is a safe procedure, but problems can occur, including bleeding, infection, pain, or injury to nearby organs or tissues.  Ask your health care provider about changing or stopping your regular medicines.  Plan to have someone take you  home from the hospital or clinic and to be with you for 24 hours after the procedure. This information is not intended to replace advice given to you by your health care provider. Make sure you discuss any questions you have with your health care provider. Document Revised: 06/04/2017 Document Reviewed: 06/04/2017 Elsevier Patient Education  2020 [employer].

## 2020-05-16 NOTE — Procedures (Signed)
  Procedure: US guided core liver biopsy   EBL:   minimal Complications:  none immediate  See full dictation in [facility].  Dillard Cannon MD Main # [phone] Pager  [phone] Mobile [phone]

## 2020-05-16 NOTE — H&P (Signed)
Chief Complaint: Patient was seen in consultation today for a random liver biopsy  Referring Physician(s): Lavena Bullion  Supervising Physician: Arne Cleveland  Patient Status: [facility] - Out-pt  History of Present Illness: Darryl Velazquez is a 59 y.o. male with a medical history significant for prostate cancer, obesity, CVA, hereditary hemochromatosis and PVD. He was referred to gastroenterology for elevated liver enzymes, lower extremity edema and imaging suspicious for cirrhosis. He has a past history of a liver biopsy five years ago at [facility] and was told he had a fatty liver. Serial labs have shown slight increases in liver enzyme levels.  Interventional Radiology has been asked to evaluate this patient for an image-guided random liver biopsy for further work up.   Past Medical History:  Diagnosis Date  . Cerebrovascular disease   . Chronic venous insufficiency   . Dyslipidemia   . Hemochromatosis, hereditary ([place])   . Medication intolerance   . Morbid obesity ([place])   . Nicotine dependence   . Nonalcoholic steatohepatitis   . Peripheral vascular disease ([place])   . Pneumothorax, traumatic   . Prostate cancer ([place])   . 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 Sleep apnea   . Stroke [facility])    2 prior to age 19.  . Venous stasis dermatitis of both lower extremities     Past Surgical History:  Procedure Laterality Date  . COLONOSCOPY  09/24/2017  . KNEE SURGERY Right   . PENILE PROSTHESIS IMPLANT  10/11/2019  . PROSTATE BIOPSY  01/29/2016   Transurethral  . prostatectomy  04/10/2016   Abdominal    Allergies: Patient has no known allergies.  Medications: Prior to Admission medications   Medication Sig Start Date End Date Taking? Authorizing Provider  aspirin EC 81 MG tablet Take 81 mg by mouth daily. Swallow whole.   Yes [provider]  furosemide (LASIX) 40 MG tablet Take 1 tablet (40 mg total) by mouth as directed. Increase Lasix to 40 mg  daily on Mondays, Wednesdays , Fridays and Sundays Take Lasix twice a day on Tuesdays, Thursdays and Saturdays Patient taking differently: Take 40 mg by mouth as directed. Increase Lasix to 40 mg daily on Mondays, Wednesdays , Fridays and Sundays Take 40 mg twice a day on Tuesdays, Thursdays and Saturdays 04/19/20 07/18/20 Yes Tobb, Kardie, DO  ibuprofen (ADVIL) 200 MG tablet Take 600-800 mg by mouth every 6 (six) hours as needed for headache or moderate pain.   Yes [provider]  naproxen sodium (ALEVE) 220 MG tablet Take 660-880 mg by mouth daily as needed (pain).   Yes [provider]  potassium chloride SA (KLOR-CON) 20 MEQ tablet Take 1 tablet (20 mEq total) by mouth daily. 04/19/20  Yes Tobb, Kardie, DO  rosuvastatin (CRESTOR) 5 MG tablet Take 5 mg by mouth every other day.   Yes [provider]     Family History  Problem Relation Age of Onset  . Diabetes Father   . Kidney failure Father   . Diabetes Sister   . Hypertension Sister   . Colon cancer Neg Hx   . Esophageal cancer Neg Hx     Social History   Socioeconomic History  . Marital status: Married    Spouse name: Not on file  . Number of children: Not on file  . Years of education: Not on file  . Highest education level: Not on file  Occupational History  . Not on file  Tobacco Use  . Smoking  status: Former Smoker    Quit date: 11/2018    Years since quitting: 1.5  . Smokeless tobacco: Never Used  Vaping Use  . Vaping Use: Former  Substance and Sexual Activity  . Alcohol use: Not Currently  . Drug use: Never  . Sexual activity: Not on file  Other Topics Concern  . Not on file  Social History Narrative  . Not on file   Social Determinants of Health   Financial Resource Strain: Not on file  Food Insecurity: Not on file  Transportation Needs: Not on file  Physical Activity: Not on file  Stress: Not on file  Social Connections: Not on file    Review of Systems: A 12 point ROS  discussed and pertinent positives are indicated in the HPI above.  All other systems are negative.  Review of Systems  Constitutional: Negative for appetite change and fatigue.  Respiratory: Negative for cough and shortness of breath.   Cardiovascular: Positive for leg swelling. Negative for chest pain.  Gastrointestinal: Negative for abdominal pain, diarrhea, nausea and vomiting.  Genitourinary: Negative for flank pain.  Musculoskeletal: Negative for back pain.  Neurological: Negative for light-headedness and headaches.    Vital Signs: BP 137/71   Pulse (!) 56   Temp 98.5 F (36.9 C) (Oral)   Resp 17   Ht 6' 4"  (1.93 m)   Wt (!) 318 lb (144.2 kg)   SpO2 98%   BMI 38.71 kg/m   Physical Exam Constitutional:      General: He is not in acute distress. HENT:     Mouth/Throat:     Mouth: Mucous membranes are moist.     Pharynx: Oropharynx is clear.  Cardiovascular:     Rate and Rhythm: Normal rate and regular rhythm.     Pulses: Normal pulses.     Heart sounds: Normal heart sounds.  Pulmonary:     Effort: Pulmonary effort is normal.     Breath sounds: Normal breath sounds.  Abdominal:     General: Bowel sounds are normal.     Palpations: Abdomen is soft.  Musculoskeletal:     Cervical back: Normal range of motion.     Right lower leg: Edema present.     Left lower leg: Edema present.  Skin:    General: Skin is warm and dry.  Neurological:     Mental Status: He is alert and oriented to person, place, and time.     Imaging: US Abdomen Limited RUQ (LIVER/GB)  Result Date: 04/24/2020 CLINICAL DATA:  Cirrhosis EXAM: ULTRASOUND ABDOMEN LIMITED RIGHT UPPER QUADRANT COMPARISON:  None. FINDINGS: Gallbladder: Gallbladder is moderately distended without wall thickening, pericholecystic fluid or reported tenderness over the gallbladder. No stones are visualized. Common bile duct: Diameter: 4.9 mm Liver: Markedly heterogeneous liver. There is a hypoechoic area, potentially a  vascular structure within the hepatic parenchyma seen on image 34 of 40 but surrounded by areas of heterogeneity and mixed echogenicity which is different from other areas of the liver, perhaps related to variable acoustic attenuation due to background nodular hepatic parenchyma. Hypoechoic area measuring approximately 12 mm. Limited assessment of the portal vein shows gross patency and normal direction of flow Other: Small volume ascites IMPRESSION: Markedly heterogeneous hepatic parenchyma limits assessment, findings are compatible with cirrhosis with question of focal lesion in the RIGHT hepatic lobe with surrounding heterogeneity, perhaps artifact and related to vessel but would suggest CT or MRI to exclude underlying lesion and or infiltrative process. Interval development of ascites  since the prior study. These results will be called to the ordering clinician or representative by the Radiologist Assistant, and communication documented in the PACS or [employer]. Electronically Signed   By: Zetta Bills M.D.   On: 04/24/2020 16:20    Labs:  CBC: Recent Labs    05/08/20 0959 05/16/20 1125  WBC 5.1 5.0  HGB 13.8 12.9*  HCT 41.3 39.3  PLT 174.0 182    COAGS: Recent Labs    05/08/20 0959 05/16/20 1125  INR 1.3* 1.6*    BMP: Recent Labs    04/19/20 1458  NA 140  K 3.3*  CL 102  CO2 29  GLUCOSE 88  BUN 11  CALCIUM 9.1  CREATININE 0.75*  GFRNONAA 100  GFRAA 116    LIVER FUNCTION TESTS: Recent Labs    04/19/20 1458  BILITOT 1.8*  AST 88*  ALT 58*  ALKPHOS 183*  PROT 6.6  ALBUMIN 3.0*    TUMOR MARKERS: Recent Labs    05/08/20 0959  AFPTM 12.7*    Assessment and Plan:  Elevated liver labs; suspected cirrhosis: Darryl Velazquez, 59 year old male, presents today to the [facility] department for an image-guided random liver biopsy.  Risks and benefits of this procedure were discussed with the patient including, but not limited to  bleeding, infection, damage to adjacent structures or low yield requiring additional tests.  All of the questions were answered and there is agreement to proceed.  The patient has been NPO. Labs and vitals have been reviewed.   Consent signed and in chart.  Thank you for this interesting consult.  I greatly enjoyed meeting Darryl Velazquez and look forward to participating in their care.  A copy of this report was sent to the requesting provider on this date.  Electronically Signed: Soyla Dryer, AGACNP-BC [phone] 05/16/2020, 12:16 PM   I spent a total of  30 Minutes   in face to face in clinical consultation, greater than 50% of which was counseling/coordinating care for image-guided random liver biopsy.

## 2020-05-16 NOTE — Telephone Encounter (Signed)
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  This also represents being a carrier of Hemochromatosis.  Plan for liver biopsy and additional work-up as previously outlined.  Once biopsy and additional imaging completed, may consider referral to Hematology as appropriate.

## 2020-05-16 NOTE — Telephone Encounter (Signed)
Spoke with the patients wife, he was having his liver bx right now. Explained that the lab results show the patient does have hematomachrosis and she stated that she will pass this message along to him. She verbalized understanding and knows we will continue with the current tx and possibly a Hematology referral.

## 2020-05-16 NOTE — Progress Notes (Signed)
Patient was given discharge instructions. He verbalized understanding. 

## 2020-05-20 ENCOUNTER — Other Ambulatory Visit: Payer: Self-pay

## 2020-05-20 ENCOUNTER — Encounter: Payer: Self-pay | Admitting: Gastroenterology

## 2020-05-20 ENCOUNTER — Ambulatory Visit (AMBULATORY_SURGERY_CENTER): Payer: BC Managed Care – PPO | Admitting: Gastroenterology

## 2020-05-20 VITALS — BP 134/65 | HR 61 | Temp 98.6°F | Resp 18 | Ht 76.0 in | Wt 319.0 lb

## 2020-05-20 DIAGNOSIS — K2970 Gastritis, unspecified, without bleeding: Secondary | ICD-10-CM

## 2020-05-20 DIAGNOSIS — K269 Duodenal ulcer, unspecified as acute or chronic, without hemorrhage or perforation: Secondary | ICD-10-CM

## 2020-05-20 DIAGNOSIS — K7460 Unspecified cirrhosis of liver: Secondary | ICD-10-CM

## 2020-05-20 DIAGNOSIS — R600 Localized edema: Secondary | ICD-10-CM | POA: Diagnosis not present

## 2020-05-20 DIAGNOSIS — K2990 Gastroduodenitis, unspecified, without bleeding: Secondary | ICD-10-CM

## 2020-05-20 DIAGNOSIS — R110 Nausea: Secondary | ICD-10-CM | POA: Diagnosis not present

## 2020-05-20 DIAGNOSIS — K3189 Other diseases of stomach and duodenum: Secondary | ICD-10-CM | POA: Diagnosis not present

## 2020-05-20 DIAGNOSIS — K319 Disease of stomach and duodenum, unspecified: Secondary | ICD-10-CM

## 2020-05-20 DIAGNOSIS — K760 Fatty (change of) liver, not elsewhere classified: Secondary | ICD-10-CM

## 2020-05-20 DIAGNOSIS — K259 Gastric ulcer, unspecified as acute or chronic, without hemorrhage or perforation: Secondary | ICD-10-CM | POA: Diagnosis not present

## 2020-05-20 DIAGNOSIS — K2980 Duodenitis without bleeding: Secondary | ICD-10-CM | POA: Diagnosis not present

## 2020-05-20 LAB — SURGICAL PATHOLOGY

## 2020-05-20 MED ORDER — PANTOPRAZOLE SODIUM 40 MG PO TBEC: 40.0000 mg | DELAYED_RELEASE_TABLET | Freq: Two times a day (BID) | ORAL | 4 refills | Status: DC

## 2020-05-20 MED ORDER — SODIUM CHLORIDE 0.9 % IV SOLN: 500.0000 mL | Freq: Once | INTRAVENOUS | Status: DC

## 2020-05-20 NOTE — Progress Notes (Signed)
Called to room to assist during endoscopic procedure.  Patient ID and intended procedure confirmed with present staff. Received instructions for my participation in the procedure from the performing physician.  

## 2020-05-20 NOTE — Progress Notes (Signed)
Report to PACU, RN, vss, BBS= Clear.  

## 2020-05-20 NOTE — Op Note (Signed)
[facility] Patient Name: Darryl Velazquez Procedure Date: 05/20/2020 9:29 AM MRN: [id number] Endoscopist: Gerrit Heck , MD Age: 59 Referring MD:  Date of Birth: 12/01/1960 Gender: Male Account #: [id number] Procedure:                Upper GI endoscopy Indications:              Cirrhosis rule out esophageal varices,                            Anorexia/Decreased appetite, Nausea Medicines:                Monitored Anesthesia Care Procedure:                Pre-Anesthesia Assessment:                           - Prior to the procedure, a History and Physical                            was performed, and patient medications and                            allergies were reviewed. The patient's tolerance of                            previous anesthesia was also reviewed. The risks                            and benefits of the procedure and the sedation                            options and risks were discussed with the patient.                            All questions were answered, and informed consent                            was obtained. Prior Anticoagulants: The patient has                            taken no previous anticoagulant or antiplatelet                            agents. ASA Grade Assessment: III - A patient with                            severe systemic disease. After reviewing the risks                            and benefits, the patient was deemed in                            satisfactory condition to undergo the procedure.  After obtaining informed consent, the endoscope was                            passed under direct vision. Throughout the                            procedure, the patient's blood pressure, pulse, and                            oxygen saturations were monitored continuously. The                            Endoscope was introduced through the mouth, and                            advanced to the third part of  duodenum. The upper                            GI endoscopy was accomplished without difficulty.                            The patient tolerated the procedure well. Scope In: Scope Out: Findings:                 The examined esophagus was normal. There was a                            polypoid lesion noted on the base of the tongue on                            brief oropharyngeal views.                           The Z-line was regular and was found 41 cm from the                            incisors.                           Diffuse moderate inflammation characterized by                            congestion (edema), erythema was found in the                            entire examined stomach. There were a few scattered                            erosions and shallow ulcers noted in the antrum.                            Biopsies were taken with a cold forceps for  histology. Estimated blood loss was minimal.                           Moderate inflammation characterized by congestion                            (edema), erosions, erythema and shallow ulcerations                            was found in the duodenal bulb, in the first                            portion of the duodenum and in the second portion                            of the duodenum. Biopsies were taken with a cold                            forceps for histology. Estimated blood loss was                            minimal.                           The third portion of the duodenum was normal. Complications:            No immediate complications. Estimated Blood Loss:     Estimated blood loss was minimal. Impression:               - Normal esophagus.                           - Z-line regular, 41 cm from the incisors.                           - Gastritis. Biopsied.                           - Duodenitis. Biopsied.                           - Normal third portion of the  duodenum. Recommendation:           - Patient has a contact number available for                            emergencies. The signs and symptoms of potential                            delayed complications were discussed with the                            patient. Return to normal activities tomorrow.                            Written discharge instructions were provided to the  patient.                           - Resume previous diet.                           - Continue present medications.                           - Await pathology results.                           - Check fasting serum gastrin today.                           - Use Protonix (pantoprazole) 40 mg PO BID for 8                            weeks, then reduce to 40 mg daily.                           - Return to GI clinic in 4-6 weeks at appointment                            to be scheduled.                           - Referral to ENT for evaluation of polypoid                            oropharyngeal lesion. Gerrit Heck, MD 05/20/2020 9:59:47 AM

## 2020-05-20 NOTE — Progress Notes (Signed)
Medical history reviewed. VS assessed by C.W

## 2020-05-20 NOTE — Patient Instructions (Signed)
Await pathology  Please go to lab today before leaving  Please read over handout about gastritis  I sent prescription for Protonix to your pharmacy- take 1 tablet 30 minutes before breakfast and dinner for 8 weeks.  After that take 1 tablet daily  Dr. Vivia Ewing office will call you to set up an ENT evaluation and an repeat office visit  Continue your normal medications  YOU HAD AN ENDOSCOPIC PROCEDURE TODAY AT [facility]:   Refer to the procedure report that was given to you for any specific questions about what was found during the examination.  If the procedure report does not answer your questions, please call your gastroenterologist to clarify.  If you requested that your care partner not be given the details of your procedure findings, then the procedure report has been included in a sealed envelope for you to review at your convenience later.  YOU SHOULD EXPECT: Some feelings of bloating in the abdomen. Passage of more gas than usual.  Walking can help get rid of the air that was put into your GI tract during the procedure and reduce the bloating.  Please Note:  You might notice some irritation and congestion in your nose or some drainage.  This is from the oxygen used during your procedure.  There is no need for concern and it should clear up in a day or so.  SYMPTOMS TO REPORT IMMEDIATELY:   Following upper endoscopy (EGD)  Vomiting of blood or coffee ground material  New chest pain or pain under the shoulder blades  Painful or persistently difficult swallowing  New shortness of breath  Fever of 100F or higher  Black, tarry-looking stools  For urgent or emergent issues, a gastroenterologist can be reached at any hour by calling [phone]. Do not use MyChart messaging for urgent concerns.    DIET:  We do recommend a small meal at first, but then you may proceed to your regular diet.  Drink plenty of fluids but you should avoid alcoholic beverages for  24 hours.  ACTIVITY:  You should plan to take it easy for the rest of today and you should NOT DRIVE or use heavy machinery until tomorrow (because of the sedation medicines used during the test).    FOLLOW UP: Our staff will call the number listed on your records 48-72 hours following your procedure to check on you and address any questions or concerns that you may have regarding the information given to you following your procedure. If we do not reach you, we will leave a message.  We will attempt to reach you two times.  During this call, we will ask if you have developed any symptoms of COVID 19. If you develop any symptoms (ie: fever, flu-like symptoms, shortness of breath, cough etc.) before then, please call [phone].  If you test positive for Covid 19 in the 2 weeks post procedure, please call and report this information to [place].    If any biopsies were taken you will be contacted by phone or by letter within the next 1-3 weeks.  Please call us at [phone] if you have not heard about the biopsies in 3 weeks.    SIGNATURES/CONFIDENTIALITY: You and/or your care partner have signed paperwork which will be entered into your electronic medical record.  These signatures attest to the fact that that the information above on your After Visit Summary has been reviewed and is understood.  Full responsibility of the confidentiality of this discharge  information lies with you and/or your care-partner.

## 2020-05-22 ENCOUNTER — Telehealth: Payer: Self-pay

## 2020-05-22 LAB — GASTRIN: Gastrin: 20 pg/mL (ref <=100)

## 2020-05-22 LAB — HIV ANTIBODY (ROUTINE TESTING W REFLEX): HIV 1&2 Ab, 4th Generation: NONREACTIVE

## 2020-05-22 NOTE — Telephone Encounter (Signed)
  Follow up Call-  Call back number 05/20/2020  Post procedure Call Back phone  # [phone]  Permission to leave phone message Yes     Patient questions:  Do you have a fever, pain , or abdominal swelling? No. Pain Score  0 *  Have you tolerated food without any problems? Yes.    Have you been able to return to your normal activities? Yes.    Do you have any questions about your discharge instructions: Diet   No. Medications  No. Follow up visit  No.  Do you have questions or concerns about your Care? No.  Actions: * If pain score is 4 or above: No action needed, pain <4.  1. Have you developed a fever since your procedure? no  2.   Have you had an respiratory symptoms (SOB or cough) since your procedure? no  3.   Have you tested positive for COVID 19 since your procedure no  4.   Have you had any family members/close contacts diagnosed with the COVID 19 since your procedure?  no   If yes to any of these questions please route to Joylene John, RN and Joella Prince, RN

## 2020-05-23 ENCOUNTER — Encounter: Payer: Self-pay | Admitting: General Surgery

## 2020-05-24 ENCOUNTER — Encounter: Payer: Self-pay | Admitting: Gastroenterology

## 2020-05-28 ENCOUNTER — Other Ambulatory Visit: Payer: Self-pay | Admitting: Gastroenterology

## 2020-05-28 ENCOUNTER — Other Ambulatory Visit: Payer: Self-pay

## 2020-05-28 ENCOUNTER — Telehealth: Payer: Self-pay | Admitting: Gastroenterology

## 2020-05-28 ENCOUNTER — Ambulatory Visit (HOSPITAL_COMMUNITY)
Admission: RE | Admit: 2020-05-28 | Discharge: 2020-05-28 | Disposition: A | Discharge status: Home / Self Care | Payer: BC Managed Care – PPO | Source: Ambulatory Visit | Attending: Gastroenterology | Admitting: Gastroenterology

## 2020-05-28 DIAGNOSIS — K760 Fatty (change of) liver, not elsewhere classified: Secondary | ICD-10-CM | POA: Diagnosis not present

## 2020-05-28 DIAGNOSIS — R600 Localized edema: Secondary | ICD-10-CM

## 2020-05-28 DIAGNOSIS — K7460 Unspecified cirrhosis of liver: Secondary | ICD-10-CM

## 2020-05-28 DIAGNOSIS — R110 Nausea: Secondary | ICD-10-CM | POA: Insufficient documentation

## 2020-05-28 IMAGING — MR MR ABDOMEN WO/W CM MRCP
17 of 20 series · 38 of 48 positions shown · IV contrast (10 GADAVIST)
Comparison: 10 mL Gadavist

CLINICAL DATA: Cirrhosis, history of hereditary hemochromatosis

EXAM:
MRI ABDOMEN WITHOUT AND WITH CONTRAST (INCLUDING MRCP)
TECHNIQUE: Multiplanar multisequence MR imaging of the abdomen was performed
both before and after the administration of intravenous contrast.
Heavily T2-weighted images of the biliary and pancreatic ducts were
obtained, and three-dimensional MRCP images were rendered by post
processing.
CONTRAST:  10mL GADAVIST GADOBUTROL 1 MMOL/ML IV SOLN

[Series 3: T2 fat-sat · axial · 6.0mm · 1.25mm/px · 1 of 36 slices shown]
[im 1/36]
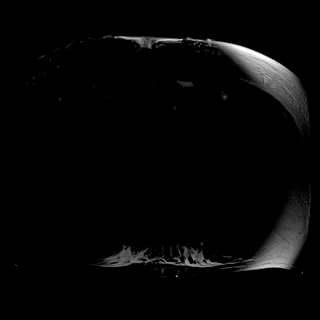

[Series 5: DWI · axial · 6.0mm · 1.49mm/px · z∈[-184,+68]mm · 2 of 72 slices shown (1 of 2)]
[im 1/72]
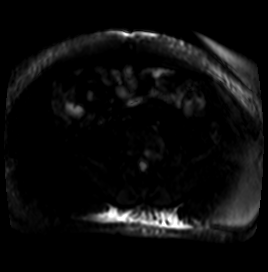
[im 72/72]
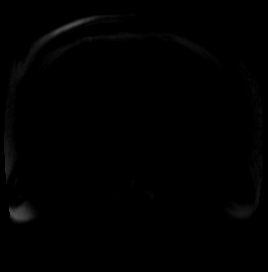

[Series 6: DWI · axial · 6.0mm · 1.49mm/px · 1 of 36 slices shown (2 of 2)]
[im 1/36]
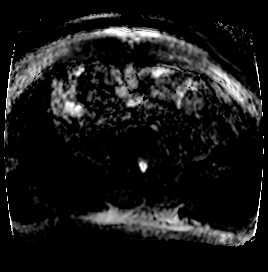

[Series 8: cor_3d_spc_trig · coronal · 1.0mm · 0.49mm/px · 3 of 72 slices shown]
[im 1/72]
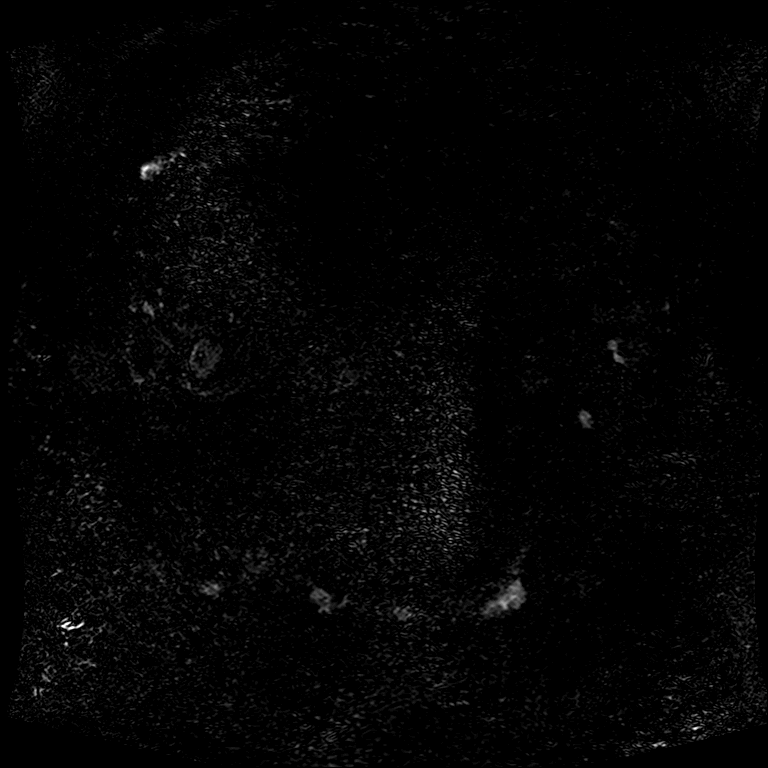
[im 36/72]
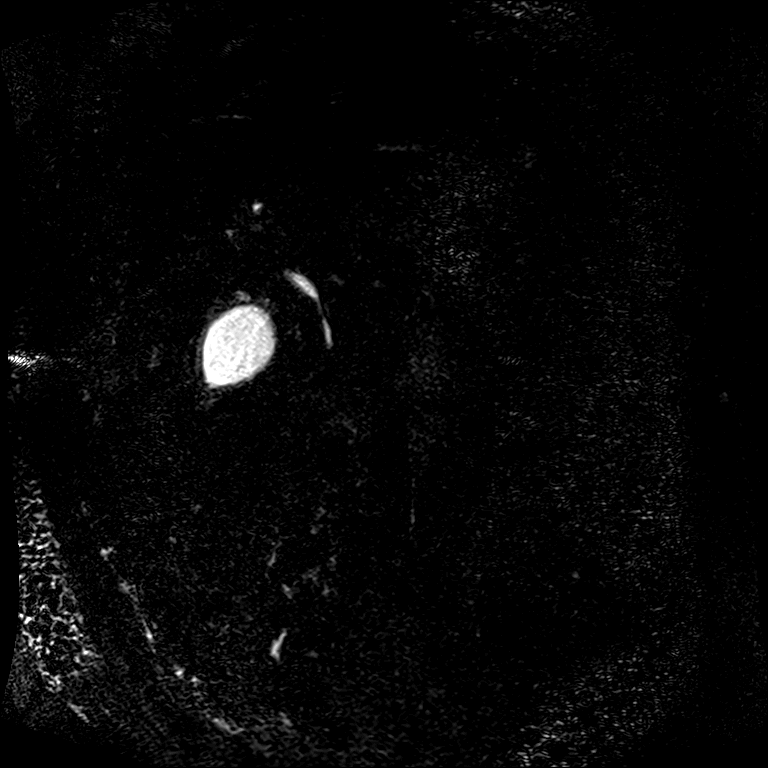
[im 72/72]
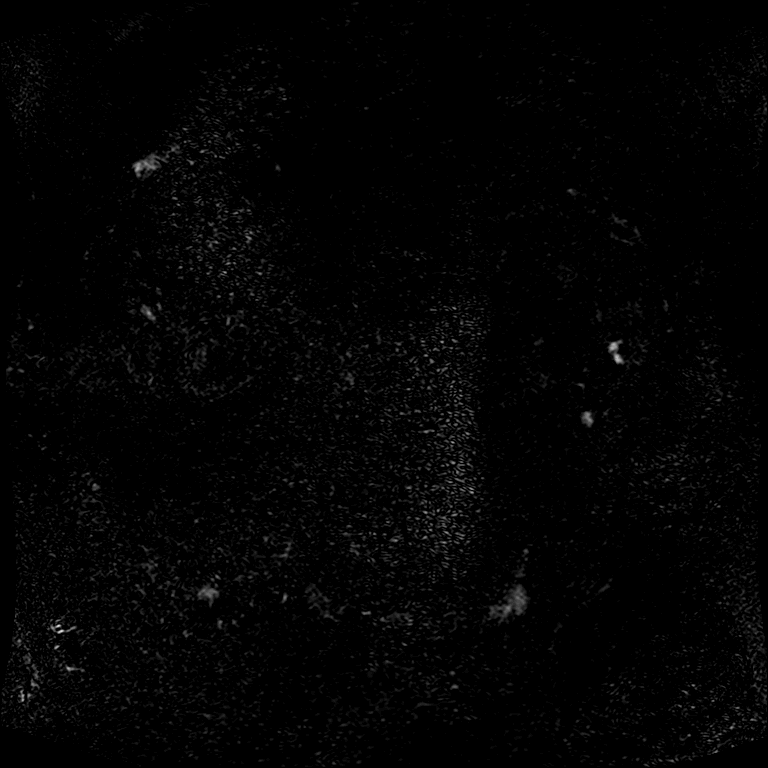

[Series 10: cor obl thk · sagittal · 50.0mm · 0.78mm/px · 1 of 9 slices shown]
[im 1/9]
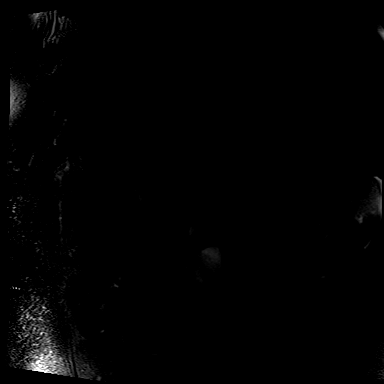

[Series 12: T2 · coronal · 6.0mm · 1.95mm/px · 1 of 30 slices shown (1 of 2)]
[im 1/30]
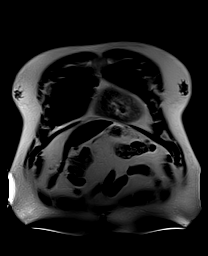

[Series 14: T1 · axial · 3.0mm · 1.38mm/px · z∈[-197,+16]mm · 3 of 72 slices shown (1 of 2)]
[im 1/72]
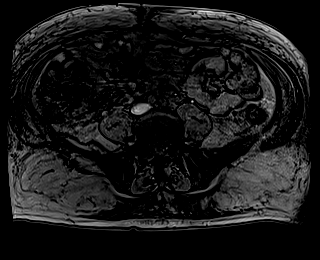
[im 36/72]
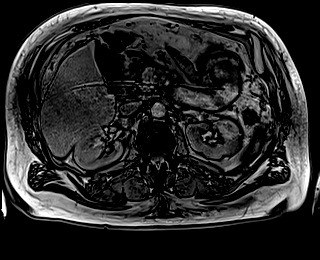
[im 72/72]
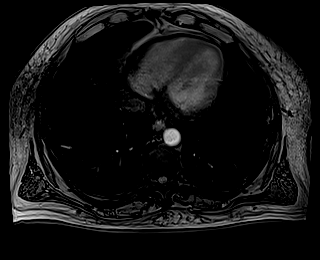

[Series 15: T1 · axial · 3.0mm · 1.38mm/px · z∈[-197,+16]mm · 3 of 72 slices shown (2 of 2)]
[im 1/72]
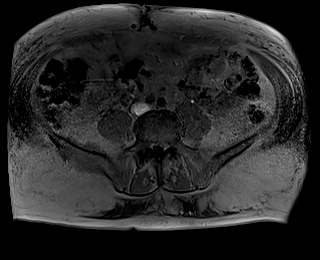
[im 36/72]
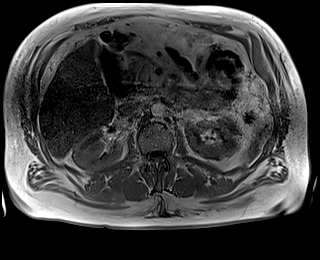
[im 72/72]
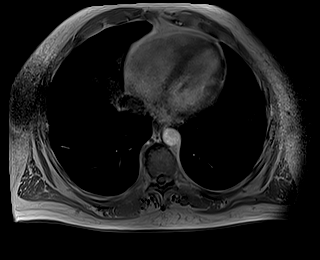

[Series 16: T2 · axial · 6.0mm · 1.72mm/px · 1 of 30 slices shown (2 of 2)]
[im 1/30]
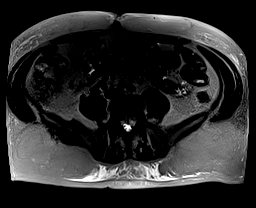

[Series 18: T1 dynamic · axial · 3.0mm · 1.38mm/px · z∈[-220,+16]mm · 3 of 80 slices shown (1 of 8)]
[im 1/80]
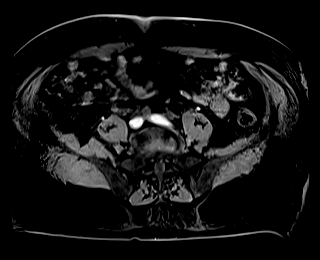
[im 40/80]
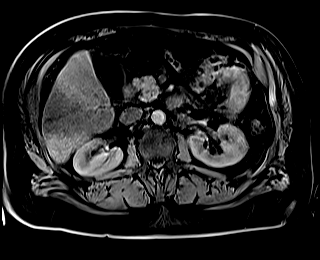
[im 80/80]
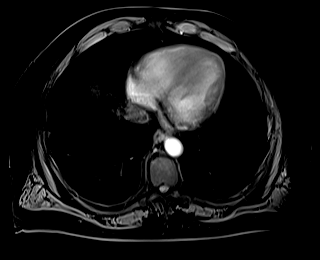

[Series 22: T1 dynamic · axial · 3.0mm · 1.38mm/px · z∈[-220,+16]mm · 3 of 80 slices shown (2 of 8)]
[im 1/80]
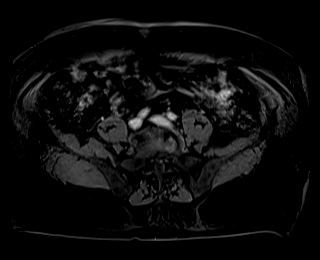
[im 40/80]
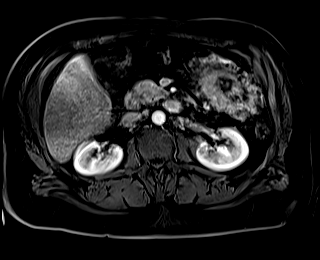
[im 80/80]
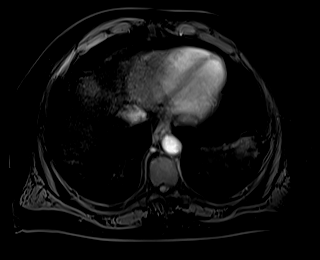

[Series 23: T1 dynamic · axial · 3.0mm · 1.38mm/px · z∈[-220,+16]mm · 3 of 80 slices shown (3 of 8)]
[im 1/80]
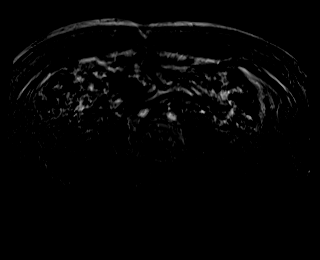
[im 40/80]
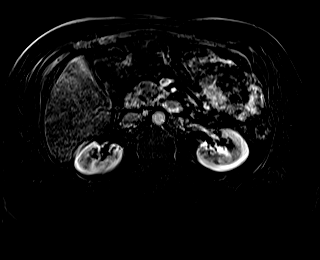
[im 80/80]
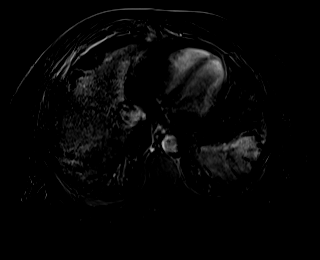

[Series 26: T1 dynamic · axial · 3.0mm · 1.38mm/px · z∈[-220,+16]mm · 3 of 80 slices shown (4 of 8)]
[im 1/80]
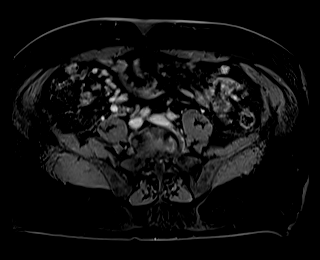
[im 40/80]
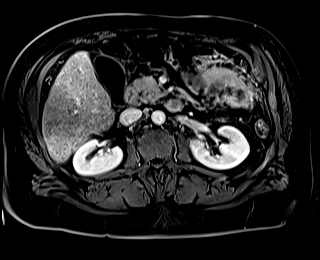
[im 80/80]
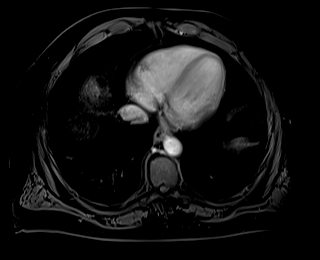

[Series 27: T1 dynamic · axial · 3.0mm · 1.38mm/px · z∈[-220,+16]mm · 3 of 80 slices shown (5 of 8)]
[im 1/80]
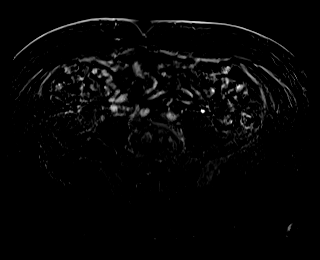
[im 40/80]
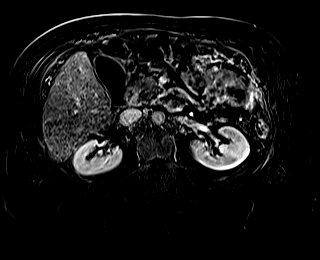
[im 80/80]
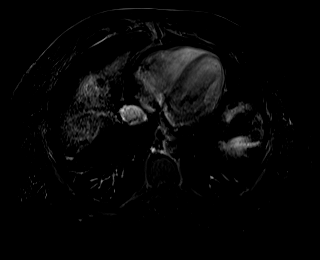

[Series 30: T1 dynamic · axial · 3.0mm · 1.38mm/px · z∈[-220,+16]mm · 3 of 80 slices shown (6 of 8)]
[im 1/80]
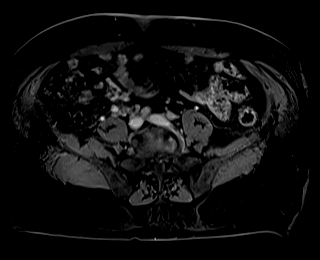
[im 40/80]
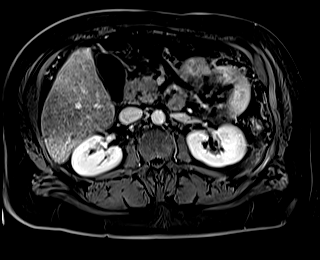
[im 80/80]
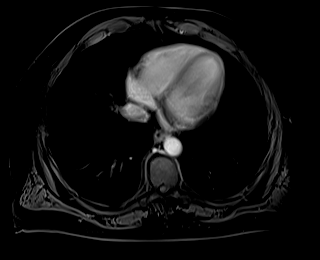

[Series 31: T1 dynamic · axial · 3.0mm · 1.38mm/px · z∈[-220,+16]mm · 3 of 80 slices shown (7 of 8)]
[im 1/80]
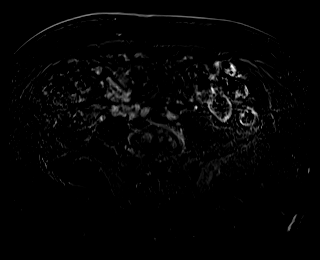
[im 40/80]
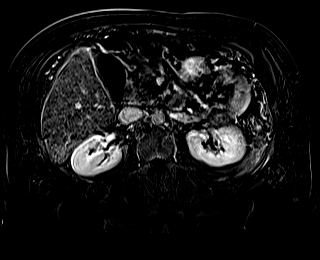
[im 80/80]
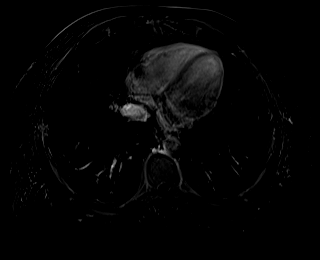

[Series 33: T1 dynamic · coronal · 3.0mm · 1.56mm/px · 1 of 72 slices shown (8 of 8)]
[im 1/72]
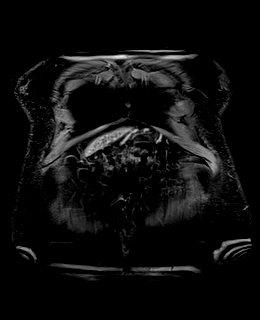

[38 of 48 positions shown; findings below may reference images not displayed]

FINDINGS: Examination is generally limited by breath motion artifact.

Lower chest: No acute findings.

Hepatobiliary: Coarse, nodular contrast enhancement of the liver
parenchyma. No overt cirrhotic morphology. No mass or other
parenchymal abnormality identified.

Pancreas: No mass, inflammatory changes, or other parenchymal
abnormality identified.

Spleen:  Upper limit of normal in size, maximum span 13.0 cm.

Adrenals/Urinary Tract: No masses identified. No evidence of
hydronephrosis.

Stomach/Bowel: Visualized portions within the abdomen are
unremarkable.

Vascular/Lymphatic: No pathologically enlarged lymph nodes
identified. No abdominal aortic aneurysm demonstrated.

Other:  Trace perihepatic ascites.

Musculoskeletal: No suspicious bone lesions identified.
IMPRESSION: 1. Examination is generally somewhat limited by breath motion
artifact. Within this limitation, there is coarse, nodular contrast
enhancement of the liver parenchyma, in keeping with biopsy-proven
cirrhosis and hereditary hemochromatosis. There is no overt
cirrhotic morphology or morphologic evidence of portal hypertension,
such as splenomegaly. No mass, suspicious contrast enhancement or
other parenchymal abnormality identified to correspond to suspected
mass in the right lobe of the liver noted on prior ultrasound.
2. Trace perihepatic ascites.

## 2020-05-28 MED ORDER — GADOBUTROL 1 MMOL/ML IV SOLN
10.0000 mL | Freq: Once | INTRAVENOUS | Status: AC | PRN
  Administered 2020-05-28: 10 mL via INTRAVENOUS

## 2020-05-28 NOTE — Telephone Encounter (Signed)
Spoke to patient's wife. She or the will be notified once the results come back.  A 4-6 week follow up from EGD has been scheduled.

## 2020-05-28 NOTE — Telephone Encounter (Signed)
Inbound call from patient's wife requesting a call back please in regards to patient's lab results.

## 2020-05-29 ENCOUNTER — Ambulatory Visit: Payer: BC Managed Care – PPO | Admitting: Gastroenterology

## 2020-05-30 ENCOUNTER — Ambulatory Visit: Payer: BC Managed Care – PPO | Admitting: Cardiology

## 2020-06-14 DIAGNOSIS — K7469 Other cirrhosis of liver: Secondary | ICD-10-CM | POA: Diagnosis not present

## 2020-06-14 DIAGNOSIS — Z8481 Family history of carrier of genetic disease: Secondary | ICD-10-CM | POA: Diagnosis not present

## 2020-06-14 DIAGNOSIS — R188 Other ascites: Secondary | ICD-10-CM | POA: Diagnosis not present

## 2020-06-14 DIAGNOSIS — K766 Portal hypertension: Secondary | ICD-10-CM | POA: Diagnosis not present

## 2020-06-14 DIAGNOSIS — K7581 Nonalcoholic steatohepatitis (NASH): Secondary | ICD-10-CM | POA: Diagnosis not present

## 2020-06-18 ENCOUNTER — Other Ambulatory Visit: Payer: Self-pay | Admitting: Family

## 2020-06-19 ENCOUNTER — Other Ambulatory Visit: Payer: Self-pay

## 2020-06-19 ENCOUNTER — Inpatient Hospital Stay: Payer: BC Managed Care – PPO | Attending: Family

## 2020-06-19 ENCOUNTER — Encounter: Payer: Self-pay | Admitting: Family

## 2020-06-19 ENCOUNTER — Inpatient Hospital Stay (HOSPITAL_BASED_OUTPATIENT_CLINIC_OR_DEPARTMENT_OTHER): Payer: BC Managed Care – PPO | Admitting: Family

## 2020-06-19 DIAGNOSIS — I739 Peripheral vascular disease, unspecified: Secondary | ICD-10-CM

## 2020-06-19 DIAGNOSIS — K760 Fatty (change of) liver, not elsewhere classified: Secondary | ICD-10-CM | POA: Diagnosis not present

## 2020-06-19 DIAGNOSIS — Z833 Family history of diabetes mellitus: Secondary | ICD-10-CM | POA: Insufficient documentation

## 2020-06-19 DIAGNOSIS — Z791 Long term (current) use of non-steroidal anti-inflammatories (NSAID): Secondary | ICD-10-CM | POA: Diagnosis not present

## 2020-06-19 DIAGNOSIS — Z9079 Acquired absence of other genital organ(s): Secondary | ICD-10-CM

## 2020-06-19 DIAGNOSIS — Z87891 Personal history of nicotine dependence: Secondary | ICD-10-CM | POA: Insufficient documentation

## 2020-06-19 DIAGNOSIS — Z8673 Personal history of transient ischemic attack (TIA), and cerebral infarction without residual deficits: Secondary | ICD-10-CM

## 2020-06-19 DIAGNOSIS — Z8546 Personal history of malignant neoplasm of prostate: Secondary | ICD-10-CM

## 2020-06-19 DIAGNOSIS — Z7982 Long term (current) use of aspirin: Secondary | ICD-10-CM | POA: Insufficient documentation

## 2020-06-19 DIAGNOSIS — Z8249 Family history of ischemic heart disease and other diseases of the circulatory system: Secondary | ICD-10-CM | POA: Insufficient documentation

## 2020-06-19 DIAGNOSIS — E785 Hyperlipidemia, unspecified: Secondary | ICD-10-CM | POA: Insufficient documentation

## 2020-06-19 DIAGNOSIS — Z79899 Other long term (current) drug therapy: Secondary | ICD-10-CM | POA: Insufficient documentation

## 2020-06-19 LAB — CBC WITH DIFFERENTIAL (CANCER CENTER ONLY)
Abs Immature Granulocytes: 0.01 10*3/uL (ref 0.00–0.07)
Basophils Absolute: 0.1 10*3/uL (ref 0.0–0.1)
Basophils Relative: 1 %
Eosinophils Absolute: 0.4 10*3/uL (ref 0.0–0.5)
Eosinophils Relative: 5 %
HCT: 37.6 % — ABNORMAL LOW (ref 39.0–52.0)
Hemoglobin: 12.9 g/dL — ABNORMAL LOW (ref 13.0–17.0)
Immature Granulocytes: 0 %
Lymphocytes Relative: 27 %
Lymphs Abs: 1.7 10*3/uL (ref 0.7–4.0)
MCH: 29.4 pg (ref 26.0–34.0)
MCHC: 34.3 g/dL (ref 30.0–36.0)
MCV: 85.6 fL (ref 80.0–100.0)
Monocytes Absolute: 1 10*3/uL (ref 0.1–1.0)
Monocytes Relative: 16 %
Neutro Abs: 3.3 10*3/uL (ref 1.7–7.7)
Neutrophils Relative %: 51 %
Platelet Count: 163 10*3/uL (ref 150–400)
RBC: 4.39 MIL/uL (ref 4.22–5.81)
RDW: 14.7 % (ref 11.5–15.5)
WBC Count: 6.5 10*3/uL (ref 4.0–10.5)
nRBC: 0 % (ref 0.0–0.2)

## 2020-06-19 LAB — CMP (CANCER CENTER ONLY)
ALT: 49 U/L — ABNORMAL HIGH (ref 0–44)
AST: 70 U/L — ABNORMAL HIGH (ref 15–41)
Albumin: 3.2 g/dL — ABNORMAL LOW (ref 3.5–5.0)
Alkaline Phosphatase: 165 U/L — ABNORMAL HIGH (ref 38–126)
Anion gap: 6 (ref 5–15)
BUN: 14 mg/dL (ref 6–20)
CO2: 28 mmol/L (ref 22–32)
Calcium: 9.5 mg/dL (ref 8.9–10.3)
Chloride: 103 mmol/L (ref 98–111)
Creatinine: 0.85 mg/dL (ref 0.61–1.24)
GFR, Estimated: >60 mL/min (ref >60)
Glucose, Bld: 150 mg/dL — ABNORMAL HIGH (ref 70–99)
Potassium: 4.1 mmol/L (ref 3.5–5.1)
Sodium: 137 mmol/L (ref 135–145)
Total Bilirubin: 1.8 mg/dL — ABNORMAL HIGH (ref 0.3–1.2)
Total Protein: 6.8 g/dL (ref 6.5–8.1)

## 2020-06-19 LAB — SAVE SMEAR(SSMR), FOR PROVIDER SLIDE REVIEW

## 2020-06-19 LAB — LACTATE DEHYDROGENASE: LDH: 161 U/L (ref 98–192)

## 2020-06-19 NOTE — Progress Notes (Unsigned)
Hematology/Oncology Consultation   Name: Darryl Velazquez      MRN: [id number]    Location: Room/bed info not found  Date: 06/19/2020 Time:2:37 PM   REFERRING PHYSICIAN: Hammond, Velazquez  REASON FOR CONSULT:  Hemochromatosis    DIAGNOSIS: Hemochromatosis, heterozygous for the H63D mutation   HISTORY OF PRESENT ILLNESS: Darryl Velazquez is a very pleasant 60 yo caucasian gentleman with recent diagnosis of cirrhosis and history of hemochromatosis, heterozygous for the H63D mutation.  December iron saturation was 62% and ferritin 419. He has not been phlebotomized or donated blood yet.  He was let go from his job and has [employer] which he states is quite expensive. This has caused some financial stress.  He has seen Darryl Velazquez with hepatology. He had a liver biopsy 5 years ago with [facility] and diagnosed with a fatty liver at that time.  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 No overt cirrhotic morphology or morphologic evidence of portal hypertension/splenomegaly. No mass, suspicious contrast enhancement or other parenchymal abnormality identified. Trace perihepatic ascites." No history of hepatitis.  LFT's today are slightly improved. AST 70, ALT 49 and alk/phos 165.  He is not taking an iron supplement.  He occasionally feels "off balance". He notes fatigue and loss of appetite. He does have severe GERD with nausea and is currently on Protonix.  He had an EGD 05/20/2020 which noted gastritis and duodenitis. Biopsies were negative.  He has had some swelling in his lower extremities for a few months which his says seems better. No pitting edema. Pedal pulses are 2+. No redness or breaks  in skin on exam. He wears compression stockings regularly for added support and states that he takes Lasix as needed to help reduce fluid retention.   He has occasional leg cramps. No falls or syncopal episodes to report.  He has history of prostate cancer with total prostatectomy. No chemo or radiation.  Family history of cancer includes: father - colon and mother - renal.  No family history of liver disease. Sister has history of heart disease.  Patient has history of CVA in his late 37's as well as PVD.  No history of ETOH.  He quit smoking 2 years ago (previously 1 ppd). No recreational drug use. No history of diabetes or thyroid disease.  No fever, chills, cough, rash, dizziness, SOB, chest pain, palpitations, abdominal pain or changes in bowel or bladder habits.  He has not noted any blood loss. No abnormal bruising, no petechiae.  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   ROS: All other 10 point review of systems is negative.   PAST MEDICAL HISTORY:   Past Medical History:  Diagnosis Date  . Cerebrovascular disease   . Chronic venous insufficiency   . Dyslipidemia   . Hemochromatosis, hereditary ([place])   . Medication intolerance   . Morbid obesity ([place])   . Nicotine dependence   . Nonalcoholic steatohepatitis   . Peripheral vascular disease ([place])   . Pneumothorax, traumatic   . Prostate cancer ([place])   . 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 Sleep apnea   . Stroke [facility])    2 prior to age 72.  . Venous stasis dermatitis of both lower extremities  ALLERGIES: No Known Allergies    MEDICATIONS:  Current Outpatient Medications on File Prior to Visit  Medication Sig Dispense Refill  . aspirin EC 81 MG tablet Take 81 mg by mouth daily. Swallow whole.    . furosemide (LASIX) 40 MG tablet Take 1 tablet (40 mg total) by mouth as directed. Increase Lasix to 40 mg daily on Mondays, Wednesdays , Fridays and Sundays Take Lasix twice a day on Tuesdays,  Thursdays and Saturdays (Patient taking differently: Take 40 mg by mouth as directed. Increase Lasix to 40 mg daily on Mondays, Wednesdays , Fridays and Sundays Take 40 mg twice a day on Tuesdays, Thursdays and Saturdays) 270 tablet 3  . ibuprofen (ADVIL) 200 MG tablet Take 600-800 mg by mouth every 6 (six) hours as needed for headache or moderate pain. (Patient not taking: Reported on 05/20/2020)    . naproxen sodium (ALEVE) 220 MG tablet Take 660-880 mg by mouth daily as needed (pain). (Patient not taking: Reported on 05/20/2020)    . pantoprazole (PROTONIX) 40 MG tablet Take 1 tablet (40 mg total) by mouth 2 (two) times daily. Take 40 mg tablet twice daily for 8 weeks, then take daily 60 tablet 4  . potassium chloride SA (KLOR-CON) 20 MEQ tablet Take 1 tablet (20 mEq total) by mouth daily. 90 tablet 3  . rosuvastatin (CRESTOR) 5 MG tablet Take 5 mg by mouth every other day.     Current Facility-Administered Medications on File Prior to Visit  Medication Dose Route Frequency Provider Last Rate Last Admin  . triamcinolone acetonide (KENALOG) 10 MG/ML injection 10 mg  10 mg Other Once Darryl Velazquez, DPM         PAST SURGICAL HISTORY Past Surgical History:  Procedure Laterality Date  . COLONOSCOPY  09/24/2017  . KNEE SURGERY Right   . PENILE PROSTHESIS IMPLANT  10/11/2019  . PROSTATE BIOPSY  01/29/2016   Transurethral  . prostatectomy  04/10/2016   Abdominal    FAMILY HISTORY: Family History  Problem Relation Age of Onset  . Diabetes Father   . Kidney failure Father   . Diabetes Sister   . Hypertension Sister   . Colon cancer Neg Hx   . Esophageal cancer Neg Hx   . Prostate cancer Neg Hx   . Rectal cancer Neg Hx   . Stomach cancer Neg Hx     SOCIAL HISTORY:  reports that he quit smoking about 19 months ago. He has never used smokeless tobacco. He reports previous alcohol use. He reports that he does not use drugs.  PERFORMANCE STATUS: The patient's performance status is 1 -  Symptomatic but completely ambulatory  PHYSICAL EXAM: Most Recent Vital Signs: There were no vitals taken for this visit. BP (!) 153/61 (BP Location: Right Arm, Patient Position: Sitting)   Resp 17   SpO2 100%   General Appearance:    Alert, cooperative, no distress, appears stated age  Head:    Normocephalic, without obvious abnormality, atraumatic  Eyes:    PERRL, conjunctiva/corneas clear, EOM's intact, fundi    benign, both eyes             Throat:   Lips, mucosa, and tongue normal; teeth and gums normal  Neck:   Supple, symmetrical, trachea midline, no adenopathy;       thyroid:  No enlargement/tenderness/nodules; no carotid   bruit or JVD  Back:     Symmetric, no curvature, ROM normal, no CVA tenderness  Lungs:     Clear  to auscultation bilaterally, respirations unlabored  Chest wall:    No tenderness or deformity  Heart:    Regular rate and rhythm, S1 and S2 normal, no murmur, rub   or gallop  Abdomen:     Soft, non-tender, bowel sounds active all four quadrants,    no masses, no organomegaly        Extremities:   Extremities normal, atraumatic, no cyanosis or edema  Pulses:   2+ and symmetric all extremities  Skin:   Skin color, texture, turgor normal, no rashes or lesions  Lymph nodes:   Cervical, supraclavicular, and axillary nodes normal  Neurologic:   CNII-XII intact. Normal strength, sensation and reflexes      throughout    LABORATORY DATA:  Results for orders placed or performed in visit on 06/19/20 (from the past 48 hour(s))  CBC with Differential ([place] Only)     Status: Abnormal   Collection Time: 06/19/20  1:35 PM  Result Value Ref Range   WBC Count 6.5 4.0 - 10.5 K/uL   RBC 4.39 4.22 - 5.81 MIL/uL   Hemoglobin 12.9 (L) 13.0 - 17.0 g/dL   HCT 37.6 (L) 39.0 - 52.0 %   MCV 85.6 80.0 - 100.0 fL   MCH 29.4 26.0 - 34.0 pg   MCHC 34.3 30.0 - 36.0 g/dL   RDW 14.7 11.5 - 15.5 %   Platelet Count 163 150 - 400 K/uL   nRBC 0.0 0.0 - 0.2 %   Neutrophils  Relative % 51 %   Neutro Abs 3.3 1.7 - 7.7 K/uL   Lymphocytes Relative 27 %   Lymphs Abs 1.7 0.7 - 4.0 K/uL   Monocytes Relative 16 %   Monocytes Absolute 1.0 0.1 - 1.0 K/uL   Eosinophils Relative 5 %   Eosinophils Absolute 0.4 0.0 - 0.5 K/uL   Basophils Relative 1 %   Basophils Absolute 0.1 0.0 - 0.1 K/uL   Immature Granulocytes 0 %   Abs Immature Granulocytes 0.01 0.00 - 0.07 K/uL    Comment: Performed at [facility] at [facility], [address] [place], [place] [address]  CMP ([place] only)     Status: Abnormal   Collection Time: 06/19/20  1:35 PM  Result Value Ref Range   Sodium 137 135 - 145 mmol/L   Potassium 4.1 3.5 - 5.1 mmol/L   Chloride 103 98 - 111 mmol/L   CO2 28 22 - 32 mmol/L   Glucose, Bld 150 (H) 70 - 99 mg/dL    Comment: Glucose reference range applies only to samples taken after fasting for at least 8 hours.   BUN 14 6 - 20 mg/dL   Creatinine 0.85 0.61 - 1.24 mg/dL   Calcium 9.5 8.9 - 10.3 mg/dL   Total Protein 6.8 6.5 - 8.1 g/dL   Albumin 3.2 (L) 3.5 - 5.0 g/dL   AST 70 (H) 15 - 41 U/L   ALT 49 (H) 0 - 44 U/L   Alkaline Phosphatase 165 (H) 38 - 126 U/L   Total Bilirubin 1.8 (H) 0.3 - 1.2 mg/dL   GFR, Estimated >60 >60 mL/min    Comment: (NOTE) Calculated using the CKD-EPI Creatinine Equation (2021)    Anion gap 6 5 - 15    Comment: Performed at [facility] Lab at [facility], [place], [place] [address]  [facility])     Status: None   Collection Time:  06/19/20  1:35 PM  Result Value Ref Range   Smear Review SMEAR STAINED AND AVAILABLE FOR REVIEW     Comment: Performed at [facility] Lab at [facility], [address] [place], [place] [address]  Lactate dehydrogenase (LDH)     Status: None   Collection Time: 06/19/20  1:35 PM  Result Value Ref Range   LDH 161 98 - 192 U/L    Comment: Performed at [facility] Lab at [facility], [address] [place], [place] [address]      RADIOGRAPHY: No results found.     PATHOLOGY: None  ASSESSMENT/PLAN: Mr. Weinkauf is a very pleasant 60 yo caucasian gentleman with recent diagnosis of cirrhosis and history of hemochromatosis, heterozygous for the H63D mutations.  His iron saturation at this time is quite high at 98% and ferritin 947.  He is working through his insurance issues and financial stressors.  We will have him donate weekly with [facility] for 4 weeks. Script faxed to their office.  We will recheck his labs in 5 weeks and adjust his donations from there.  Follow-up in 3 months.  He will continue to hydrate and avoid iron containing foods and supplements.   All questions were answered and he is in agreement. He was encouraged to contact our office with any questions or concerns. We can certainly see him sooner if needed.   The patient was discussed with Dr. Marin Olp and he is in agreement with the aforementioned.   Laverna Peace, NP

## 2020-06-20 ENCOUNTER — Telehealth: Payer: Self-pay

## 2020-06-20 LAB — IRON AND TIBC
Iron: 194 ug/dL — ABNORMAL HIGH (ref 42–163)
Saturation Ratios: 98 % — ABNORMAL HIGH (ref 20–55)
TIBC: 199 ug/dL — ABNORMAL LOW (ref 202–409)
UIBC: 5 ug/dL — ABNORMAL LOW (ref 117–376)

## 2020-06-20 LAB — FERRITIN: Ferritin: 947 ng/mL — ABNORMAL HIGH (ref 24–336)

## 2020-06-20 NOTE — Telephone Encounter (Signed)
No 06/19/20 los entered    aom

## 2020-06-21 ENCOUNTER — Telehealth: Payer: Self-pay | Admitting: *Deleted

## 2020-06-21 NOTE — Telephone Encounter (Signed)
-----   Message from Eliezer Bottom, NP sent at 06/21/2020  8:54 AM EST ----- Iron studies are quite high. He will need a weekly phlebotomy for 4 weeks with [facility] and unfortunately it looks like they are only located in [place] at [address]  We will recheck his labs in 5 weeks and go from there. Follow-up in 3 months. I'll send a scheduling message. I also have the form to be faxed to [facility]. Thank you!    ----- Message ----- From: Interface, Lab In [place] Sent: 06/19/2020   1:49 PM EST To: Eliezer Bottom, NP

## 2020-06-21 NOTE — Telephone Encounter (Signed)
Unable to reach pt, lmovm with instructions and to expect a call from scheduling for lab appt in 5 weeks, provider f/u in 3 months. Pt encouraged to call office with any concerns.

## 2020-06-25 ENCOUNTER — Ambulatory Visit: Payer: Self-pay | Admitting: Gastroenterology

## 2020-07-05 ENCOUNTER — Ambulatory Visit (INDEPENDENT_AMBULATORY_CARE_PROVIDER_SITE_OTHER): Payer: BC Managed Care – PPO | Admitting: Gastroenterology

## 2020-07-05 ENCOUNTER — Encounter: Payer: Self-pay | Admitting: Gastroenterology

## 2020-07-05 VITALS — BP 130/58 | HR 65 | Ht 76.0 in | Wt 316.2 lb

## 2020-07-05 DIAGNOSIS — K7581 Nonalcoholic steatohepatitis (NASH): Secondary | ICD-10-CM | POA: Diagnosis not present

## 2020-07-05 DIAGNOSIS — K766 Portal hypertension: Secondary | ICD-10-CM

## 2020-07-05 DIAGNOSIS — Z23 Encounter for immunization: Secondary | ICD-10-CM

## 2020-07-05 DIAGNOSIS — K7460 Unspecified cirrhosis of liver: Secondary | ICD-10-CM | POA: Diagnosis not present

## 2020-07-05 DIAGNOSIS — Z86010 Personal history of colonic polyps: Secondary | ICD-10-CM

## 2020-07-05 DIAGNOSIS — R600 Localized edema: Secondary | ICD-10-CM | POA: Diagnosis not present

## 2020-07-05 DIAGNOSIS — K3189 Other diseases of stomach and duodenum: Secondary | ICD-10-CM

## 2020-07-05 MED ORDER — FUROSEMIDE 40 MG PO TABS: 40.0000 mg | ORAL_TABLET | Freq: Every day | ORAL | 3 refills | Status: DC

## 2020-07-05 NOTE — Patient Instructions (Addendum)
If you are age 60 or older, your body mass index should be between 23-30. Your Body mass index is 38.5 kg/m. If this is out of the aforementioned range listed, please consider follow up with your Primary Care Provider.  If you are age 71 or younger, your body mass index should be between 19-25. Your Body mass index is 38.5 kg/m. If this is out of the aformentioned range listed, please consider follow up with your Primary Care Provider.   Restart Lasix once daily.  Please go to the lab on the 2nd floor [address], 7 days after your started Lasix.  It was a pleasure to see you today!  Please come for your second Twinrix on 08/09/2020 at 2:00pm Gerrit Heck, D.O.'

## 2020-07-05 NOTE — Progress Notes (Signed)
P  Chief Complaint:    Cirrhosis  GI History: 60 year old male with a history of prostate cancer (surgery only), history of CVA (1st at age 64), obesity (BMI 63), reported hereditary hemochromatosis, dyslipidemia, peripheral vascular disease, sleep apnea.  Follows with Dr. [name] in the Cardiology Clinic for new onset lower extremity edema. Was started on Lasix 40 mg/day and 80 mg QOD.  TTE unremarkable.  -Appears he has had mildly elevated liver enzymes since 03/2016 (AST/ALT 56/67) with normal T bili 0.6 at that time.  Imaging: -Abdominal ultrasound (09/2019): Hepatomegaly with cirrhotic liver morphology.  No suspicious hepatic lesions. -CT angio (12/16/2018): Mild distention of GB, no focal liver abnormality but concern for nodular liver contour.  Normal pancreas, normal GI tract.  Mildly prominent LN near gastrohepatic ligament similar to previous study.  Mild large amount of lymph nodes in the lower pelvis and inguinal region. -RUQ [place] (04/24/2020): Markedly heterogenous liver with appearance compatible with cirrhosis, hypoechoic area, potentially a vascular structure measuring 12 mm.  Small volume ascites.  Recommend CT or MRI. -MRI liver (05/2020): Coarse, nodular liver consistent with cirrhosis without other features of portal hypertension.  Trace perihepatic ascites.  No liver mass   Endoscopic history: -Colonoscopy x2, last being ~3 years ago in [place] and n/f polyps, with plan to repeat 5 years.  No records for review.   -EGD (05/2020): Normal esophagus, moderate portal hypertensive gastritis, moderate duodenitis.  Referred to ENT for polypoid lesion of base of tongue.  Started high-dose PPI  FHx: - Father with stomach CA in his 52's  HPI:     Patient is a 60 y.o. male presenting to the Gastroenterology Clinic for follow-up.  Was last seen by me on 05/08/2020.  Was referred to [facility] at [place], seen on 06/14/2020 with plan for f/u Q6 months. 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  Just taking Protonix 40 mg daily as he forgets to take the second pill.  Stopped taking Lasix. Does have LE edema, but he thinks this has actually improved since not working and on his feet as much lately.   Has been seen in the Hematology clinic and started weekly blood donations at [facility] x4 weeks for phlebotomy and f/u in Feb. Repeat Ferritin 947, iron 194, sat 98%, TIBC 199.  Cirrhosis Evaluation: - Etiology: NASH.  Questionable contribution hemochromatosis - Complications: Radiographic ascites, portal hypertensive gastropathy, lower extremity edema - HCC screening: UTD.  Elevated AFP, but MRI w/o but less - Variceal screening: EGD 05/2020.  No EV - Serologic evaluation: Completed 05/2020. Hemochromatosis DNA positive for 1 H63D (carrier of hemochromatosis). -Elevated IgA (856), elevated IgG (2209), elevated/positive antimitochondrial antibody (43.2), and elevated AFP (12.7). Ferritin 419 (previously 968), iron saturation at 62.5% . Normal/negative ANA, ASMA, ceruloplasmin, viral hepatitis panel, IgM, HIV - Viral hepatitis vaccination: Negative HBsAb, HAVAb.  Giving vaccine series today - Flu vaccine: UTD - Liver biopsy: 05/16/2020: Chronic minimally active hepatitis with prominent bridging septa and focal nodularity suggestive of cirrhosis (grade 1, stage IV).  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  Plan for continued observation every 6 months.    Review of systems:  No chest pain, no SOB, no fevers, no urinary sx   Past Medical History:  Diagnosis Date  . Cerebrovascular disease   . Chronic  venous insufficiency   . Dyslipidemia   . Hemochromatosis, hereditary ([place])   . Medication intolerance   . Morbid obesity ([place])   . Nicotine dependence   . Nonalcoholic steatohepatitis   . Peripheral vascular disease ([place])   . Pneumothorax, traumatic   . Prostate cancer ([place])   . 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 Sleep apnea   . Stroke [facility])    2 prior to age 84.  . Venous stasis dermatitis of both lower extremities     Patient's surgical history, family medical history, social history, medications and allergies were all reviewed in Epic    Current Outpatient Medications  Medication Sig Dispense Refill  . aspirin EC 81 MG tablet Take 81 mg by mouth daily. Swallow whole.    . pantoprazole (PROTONIX) 40 MG tablet Take 1 tablet (40 mg total) by mouth 2 (two) times daily. Take 40 mg tablet twice daily for 8 weeks, then take daily (Patient taking differently: Take 40 mg by mouth daily. Take 40 mg tablet twice daily for 8 weeks, then take daily) 60 tablet 4  . rosuvastatin (CRESTOR) 5 MG tablet Take 5 mg by mouth every other day.     Current Facility-Administered Medications  Medication Dose Route Frequency Provider Last Rate Last Admin  . triamcinolone acetonide (KENALOG) 10 MG/ML injection 10 mg  10 mg Other Once Landis Martins, DPM        Physical Exam:     BP (!) 130/58   Pulse 65   Ht 6' 4"  (1.93 m)   Wt (!) 316 lb 4 oz (143.5 kg)   BMI 38.50 kg/m   GENERAL:  Pleasant male in NAD PSYCH: : Cooperative, normal affect SKIN:  turgor, no lesions seen Ext: Bilateral lower extremity edema Musculoskeletal:  Normal muscle tone, normal strength NEURO: Alert and oriented x 3, no focal neurologic deficits   IMPRESSION and PLAN:    1) NASH Cirrhosis 2) Lower extremity edema 3) Heterozygous carrier for hemochromatosis/Iron overload 4) Portal hypertensive gastropathy 5) Radiographic ascites  -Restart Lasix 40 mg/day (was also recommended by Hepatology clinic). Can uptitrate  to effect.  -Check BMP 7 days after restarting Lasix -Continue f/u with Hepatology Clinic as planned -Continue f/u in Hematology Clinic with plan for phlebotomy -Needs HAV/HBV vaccine series. Gave 1st dose today -Counseled on low Na diet -Due to elevated AFP, recommend repeat MRI liver in 6 months for ongoing [place] screening.  If again normal, okay to return to ultrasound 6 months later. -Repeat EGD in 2023 for surveillance per Hepatology Clinic recommendations -As he is now established care with [place], it is okay to continue his Hepatology care there and just follow-up with me every 12 months in order to help minimize some of his medical expenses  6) Hx of colon polyps: - Obtain copy of previous colonscopy report from [place] for review  I spent 35 minutes of time, including in depth chart review, independent review of results as outlined above, communicating results with the patient directly, face-to-face time with the patient, coordinating care, and ordering studies and medications as appropriate, and documentation.            Lavena Bullion ,DO, FACG 07/05/2020, 4:06 PM

## 2020-07-26 ENCOUNTER — Inpatient Hospital Stay: Payer: BC Managed Care – PPO | Attending: Family

## 2020-07-26 ENCOUNTER — Other Ambulatory Visit: Payer: Self-pay

## 2020-07-26 ENCOUNTER — Telehealth: Payer: Self-pay | Admitting: *Deleted

## 2020-07-26 ENCOUNTER — Other Ambulatory Visit: Payer: Self-pay | Admitting: Family

## 2020-07-26 LAB — CMP (CANCER CENTER ONLY)
ALT: 41 U/L (ref 0–44)
AST: 72 U/L — ABNORMAL HIGH (ref 15–41)
Albumin: 2.7 g/dL — ABNORMAL LOW (ref 3.5–5.0)
Alkaline Phosphatase: 150 U/L — ABNORMAL HIGH (ref 38–126)
Anion gap: 8 (ref 5–15)
BUN: 12 mg/dL (ref 6–20)
CO2: 33 mmol/L — ABNORMAL HIGH (ref 22–32)
Calcium: 8.8 mg/dL — ABNORMAL LOW (ref 8.9–10.3)
Chloride: 100 mmol/L (ref 98–111)
Creatinine: 0.93 mg/dL (ref 0.61–1.24)
GFR, Estimated: >60 mL/min (ref >60)
Glucose, Bld: 147 mg/dL — ABNORMAL HIGH (ref 70–99)
Potassium: 3.3 mmol/L — ABNORMAL LOW (ref 3.5–5.1)
Sodium: 141 mmol/L (ref 135–145)
Total Bilirubin: 3.3 mg/dL — ABNORMAL HIGH (ref 0.3–1.2)
Total Protein: 6.1 g/dL — ABNORMAL LOW (ref 6.5–8.1)

## 2020-07-26 LAB — IRON AND TIBC
Iron: 145 ug/dL (ref 42–163)
Saturation Ratios: 82 % — ABNORMAL HIGH (ref 20–55)
TIBC: 178 ug/dL — ABNORMAL LOW (ref 202–409)
UIBC: 32 ug/dL — ABNORMAL LOW (ref 117–376)

## 2020-07-26 LAB — CBC WITH DIFFERENTIAL (CANCER CENTER ONLY)
Abs Immature Granulocytes: 0.01 10*3/uL (ref 0.00–0.07)
Basophils Absolute: 0.1 10*3/uL (ref 0.0–0.1)
Basophils Relative: 1 %
Eosinophils Absolute: 0.3 10*3/uL (ref 0.0–0.5)
Eosinophils Relative: 6 %
HCT: 30.9 % — ABNORMAL LOW (ref 39.0–52.0)
Hemoglobin: 10.6 g/dL — ABNORMAL LOW (ref 13.0–17.0)
Immature Granulocytes: 0 %
Lymphocytes Relative: 31 %
Lymphs Abs: 1.7 10*3/uL (ref 0.7–4.0)
MCH: 29.3 pg (ref 26.0–34.0)
MCHC: 34.3 g/dL (ref 30.0–36.0)
MCV: 85.4 fL (ref 80.0–100.0)
Monocytes Absolute: 1 10*3/uL (ref 0.1–1.0)
Monocytes Relative: 18 %
Neutro Abs: 2.5 10*3/uL (ref 1.7–7.7)
Neutrophils Relative %: 44 %
Platelet Count: 183 10*3/uL (ref 150–400)
RBC: 3.62 MIL/uL — ABNORMAL LOW (ref 4.22–5.81)
RDW: 15.2 % (ref 11.5–15.5)
WBC Count: 5.6 10*3/uL (ref 4.0–10.5)
nRBC: 0 % (ref 0.0–0.2)

## 2020-07-26 LAB — FERRITIN: Ferritin: 504 ng/mL — ABNORMAL HIGH (ref 24–336)

## 2020-07-26 NOTE — Telephone Encounter (Signed)
Notified pt of results and instructions. Pt verbalized and confirmed he would donate blood weekly x 4 weeks, f/u in 5 weeks with labs. No further concerns at this time.

## 2020-07-26 NOTE — Telephone Encounter (Signed)
-----   Message from Eliezer Bottom, NP sent at 07/26/2020  1:37 PM EST ----- Iron studies are coming down but will need to donate weekly x 4 with [facility]! I will fax another script to them. Thank you!   ----- Message ----- From: Interface, Lab In [place] Sent: 07/26/2020   8:18 AM EST To: Eliezer Bottom, NP

## 2020-08-08 ENCOUNTER — Telehealth: Payer: Self-pay | Admitting: *Deleted

## 2020-08-08 ENCOUNTER — Telehealth: Payer: Self-pay | Admitting: Gastroenterology

## 2020-08-08 ENCOUNTER — Other Ambulatory Visit: Payer: Self-pay | Admitting: Family

## 2020-08-08 NOTE — Telephone Encounter (Signed)
Called pt with Judson Roch, NP recommendations. Pt instructed to call [place] liver specialist. Phone number given to pt. Pt verbalized understanding,  Pt to come into [facility] for labs, pt requested 3/4 at 3 as he has an appt with [facility] same day.  Pt advised he has been donating 1x a week as instructed. No further concerns.

## 2020-08-08 NOTE — Telephone Encounter (Signed)
Patients wife called requesting to speak with a nurse states the Darryl Velazquez is not working and they are looking for an alternative

## 2020-08-08 NOTE — Telephone Encounter (Signed)
Patient said that he has been getting the run around regarding to who to see about his swelling. Patient stated that his left leg is huge and his stomach is swollen as well. 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 Does see a physician at [facility] and said that he is going to go there tomorrow morning(Friday) to see if they can do anything for him. Has an appointment with [place] to get blood drawn as well. States that he takes lasix 2 times a day for a total of 57m daily and uses his compression socks as well. Made appointment for 3/10 at 9 and patient will get his 2nd twinrx on that day at well. Patient does want to know if there is anything we can do for him in the meantime?

## 2020-08-09 ENCOUNTER — Inpatient Hospital Stay: Payer: BC Managed Care – PPO | Attending: Family

## 2020-08-09 ENCOUNTER — Telehealth: Payer: Self-pay | Admitting: *Deleted

## 2020-08-09 ENCOUNTER — Telehealth: Payer: Self-pay | Admitting: Gastroenterology

## 2020-08-09 ENCOUNTER — Other Ambulatory Visit: Payer: Self-pay

## 2020-08-09 ENCOUNTER — Encounter: Payer: Self-pay | Admitting: *Deleted

## 2020-08-09 DIAGNOSIS — F331 Major depressive disorder, recurrent, moderate: Secondary | ICD-10-CM | POA: Diagnosis not present

## 2020-08-09 DIAGNOSIS — K7460 Unspecified cirrhosis of liver: Secondary | ICD-10-CM | POA: Diagnosis not present

## 2020-08-09 DIAGNOSIS — Z6839 Body mass index (BMI) 39.0-39.9, adult: Secondary | ICD-10-CM | POA: Diagnosis not present

## 2020-08-09 LAB — CMP (CANCER CENTER ONLY)
ALT: 32 U/L (ref 0–44)
AST: 59 U/L — ABNORMAL HIGH (ref 15–41)
Albumin: 2.8 g/dL — ABNORMAL LOW (ref 3.5–5.0)
Alkaline Phosphatase: 153 U/L — ABNORMAL HIGH (ref 38–126)
Anion gap: 5 (ref 5–15)
BUN: 10 mg/dL (ref 6–20)
CO2: 33 mmol/L — ABNORMAL HIGH (ref 22–32)
Calcium: 9 mg/dL (ref 8.9–10.3)
Chloride: 100 mmol/L (ref 98–111)
Creatinine: 0.92 mg/dL (ref 0.61–1.24)
GFR, Estimated: >60 mL/min (ref >60)
Glucose, Bld: 133 mg/dL — ABNORMAL HIGH (ref 70–99)
Potassium: 3.4 mmol/L — ABNORMAL LOW (ref 3.5–5.1)
Sodium: 138 mmol/L (ref 135–145)
Total Bilirubin: 2.2 mg/dL — ABNORMAL HIGH (ref 0.3–1.2)
Total Protein: 6 g/dL — ABNORMAL LOW (ref 6.5–8.1)

## 2020-08-09 LAB — CBC WITH DIFFERENTIAL (CANCER CENTER ONLY)
Abs Immature Granulocytes: 0.02 10*3/uL (ref 0.00–0.07)
Basophils Absolute: 0.1 10*3/uL (ref 0.0–0.1)
Basophils Relative: 1 %
Eosinophils Absolute: 0.3 10*3/uL (ref 0.0–0.5)
Eosinophils Relative: 5 %
HCT: 31.4 % — ABNORMAL LOW (ref 39.0–52.0)
Hemoglobin: 10.4 g/dL — ABNORMAL LOW (ref 13.0–17.0)
Immature Granulocytes: 0 %
Lymphocytes Relative: 22 %
Lymphs Abs: 1.3 10*3/uL (ref 0.7–4.0)
MCH: 29 pg (ref 26.0–34.0)
MCHC: 33.1 g/dL (ref 30.0–36.0)
MCV: 87.5 fL (ref 80.0–100.0)
Monocytes Absolute: 1.4 10*3/uL — ABNORMAL HIGH (ref 0.1–1.0)
Monocytes Relative: 23 %
Neutro Abs: 2.8 10*3/uL (ref 1.7–7.7)
Neutrophils Relative %: 49 %
Platelet Count: 172 10*3/uL (ref 150–400)
RBC: 3.59 MIL/uL — ABNORMAL LOW (ref 4.22–5.81)
RDW: 15.8 % — ABNORMAL HIGH (ref 11.5–15.5)
WBC Count: 5.9 10*3/uL (ref 4.0–10.5)
nRBC: 0 % (ref 0.0–0.2)

## 2020-08-09 NOTE — Telephone Encounter (Signed)
Pt here for labs today requesting progress notes/records for [facility] liver specialist and Dr. Lin Landsman PCP at [facility] family prac. Discussed with pt [facility] is part of care everywhere and can access all pt records. Called Dr Lin Landsman office to confirm records needed for pt. lmovm with Dr. Lin Landsman nurse to call back.

## 2020-08-12 ENCOUNTER — Other Ambulatory Visit: Payer: Self-pay | Admitting: Family

## 2020-08-12 LAB — FERRITIN: Ferritin: 407 ng/mL — ABNORMAL HIGH (ref 24–336)

## 2020-08-12 LAB — IRON AND TIBC
Iron: 51 ug/dL (ref 42–163)
Saturation Ratios: 25 % (ref 20–55)
TIBC: 208 ug/dL (ref 202–409)
UIBC: 156 ug/dL (ref 117–376)

## 2020-08-13 MED ORDER — SPIRONOLACTONE 50 MG PO TABS: 50.0000 mg | ORAL_TABLET | Freq: Every day | ORAL | 4 refills | Status: DC

## 2020-08-13 NOTE — Telephone Encounter (Signed)
LVM on patient's phone and spoke to wife and medication sent to [facility] and made her aware of patient's appointment

## 2020-08-13 NOTE — Telephone Encounter (Signed)
I spoke with Dr. Lin Landsman, his [facility], who placed a call to our office about this patient.  Thank you for getting him a work in appointment with me this week.  Will plan to continue adjusting his diuretics at that appointment, but I will also coordinate with [facility] who he also follows with to assist.  In the meantime, does not look like he is taking/prescribed Aldactone.  Can you please confirm.  If not taking, plan to start 50 mg/day and will plan to adjust pending clinical response.

## 2020-08-15 ENCOUNTER — Telehealth: Payer: Self-pay

## 2020-08-15 ENCOUNTER — Ambulatory Visit: Payer: BC Managed Care – PPO | Admitting: Gastroenterology

## 2020-08-15 NOTE — Telephone Encounter (Signed)
Per staff message pt needs to come in for a phlebot and have labs drawn.  Per pt he has a script for [facility] for weekly phlebot.  S/w sarah and she confirmed this and is ok with pt continuing this and will keep appts as sch for 3/25    anne

## 2020-08-22 ENCOUNTER — Encounter (HOSPITAL_BASED_OUTPATIENT_CLINIC_OR_DEPARTMENT_OTHER): Payer: Self-pay

## 2020-08-22 ENCOUNTER — Ambulatory Visit: Payer: BC Managed Care – PPO | Admitting: Gastroenterology

## 2020-08-22 ENCOUNTER — Encounter: Payer: Self-pay | Admitting: General Surgery

## 2020-08-22 ENCOUNTER — Emergency Department (HOSPITAL_BASED_OUTPATIENT_CLINIC_OR_DEPARTMENT_OTHER): Payer: BC Managed Care – PPO

## 2020-08-22 ENCOUNTER — Other Ambulatory Visit: Payer: Self-pay

## 2020-08-22 ENCOUNTER — Telehealth: Payer: Self-pay | Admitting: Gastroenterology

## 2020-08-22 ENCOUNTER — Emergency Department (HOSPITAL_BASED_OUTPATIENT_CLINIC_OR_DEPARTMENT_OTHER)
Admission: EM | Admit: 2020-08-22 | Discharge: 2020-08-22 | Disposition: A | Discharge status: Home / Self Care | Payer: BC Managed Care – PPO | Attending: Emergency Medicine | Admitting: Emergency Medicine

## 2020-08-22 DIAGNOSIS — Z6841 Body Mass Index (BMI) 40.0 and over, adult: Secondary | ICD-10-CM | POA: Diagnosis not present

## 2020-08-22 DIAGNOSIS — Z87891 Personal history of nicotine dependence: Secondary | ICD-10-CM | POA: Insufficient documentation

## 2020-08-22 DIAGNOSIS — Z8546 Personal history of malignant neoplasm of prostate: Secondary | ICD-10-CM | POA: Diagnosis not present

## 2020-08-22 DIAGNOSIS — Z7982 Long term (current) use of aspirin: Secondary | ICD-10-CM | POA: Diagnosis not present

## 2020-08-22 DIAGNOSIS — Z8673 Personal history of transient ischemic attack (TIA), and cerebral infarction without residual deficits: Secondary | ICD-10-CM | POA: Insufficient documentation

## 2020-08-22 DIAGNOSIS — R600 Localized edema: Secondary | ICD-10-CM

## 2020-08-22 LAB — URINALYSIS, ROUTINE W REFLEX MICROSCOPIC
Bilirubin Urine: NEGATIVE
Glucose, UA: NEGATIVE mg/dL
Hgb urine dipstick: NEGATIVE
Ketones, ur: NEGATIVE mg/dL
Leukocytes,Ua: NEGATIVE
Nitrite: NEGATIVE
Protein, ur: NEGATIVE mg/dL
Specific Gravity, Urine: >1.03 — ABNORMAL HIGH (ref 1.005–1.030)
pH: 6 (ref 5.0–8.0)

## 2020-08-22 LAB — COMPREHENSIVE METABOLIC PANEL
ALT: 32 U/L (ref 0–44)
AST: 60 U/L — ABNORMAL HIGH (ref 15–41)
Albumin: 2.8 g/dL — ABNORMAL LOW (ref 3.5–5.0)
Alkaline Phosphatase: 160 U/L — ABNORMAL HIGH (ref 38–126)
Anion gap: 8 (ref 5–15)
BUN: 14 mg/dL (ref 6–20)
CO2: 23 mmol/L (ref 22–32)
Calcium: 8.5 mg/dL — ABNORMAL LOW (ref 8.9–10.3)
Chloride: 103 mmol/L (ref 98–111)
Creatinine, Ser: 0.76 mg/dL (ref 0.61–1.24)
GFR, Estimated: >60 mL/min (ref >60)
Glucose, Bld: 111 mg/dL — ABNORMAL HIGH (ref 70–99)
Potassium: 4 mmol/L (ref 3.5–5.1)
Sodium: 134 mmol/L — ABNORMAL LOW (ref 135–145)
Total Bilirubin: 1.2 mg/dL (ref 0.3–1.2)
Total Protein: 6.4 g/dL — ABNORMAL LOW (ref 6.5–8.1)

## 2020-08-22 LAB — CBC WITH DIFFERENTIAL/PLATELET
Abs Immature Granulocytes: 0.02 10*3/uL (ref 0.00–0.07)
Basophils Absolute: 0.1 10*3/uL (ref 0.0–0.1)
Basophils Relative: 1 %
Eosinophils Absolute: 0.5 10*3/uL (ref 0.0–0.5)
Eosinophils Relative: 6 %
HCT: 31.1 % — ABNORMAL LOW (ref 39.0–52.0)
Hemoglobin: 10.3 g/dL — ABNORMAL LOW (ref 13.0–17.0)
Immature Granulocytes: 0 %
Lymphocytes Relative: 21 %
Lymphs Abs: 1.6 10*3/uL (ref 0.7–4.0)
MCH: 29 pg (ref 26.0–34.0)
MCHC: 33.1 g/dL (ref 30.0–36.0)
MCV: 87.6 fL (ref 80.0–100.0)
Monocytes Absolute: 1 10*3/uL (ref 0.1–1.0)
Monocytes Relative: 14 %
Neutro Abs: 4.3 10*3/uL (ref 1.7–7.7)
Neutrophils Relative %: 58 %
Platelets: 223 10*3/uL (ref 150–400)
RBC: 3.55 MIL/uL — ABNORMAL LOW (ref 4.22–5.81)
RDW: 15.1 % (ref 11.5–15.5)
WBC: 7.5 10*3/uL (ref 4.0–10.5)
nRBC: 0 % (ref 0.0–0.2)

## 2020-08-22 LAB — IRON AND TIBC
Iron: 26 ug/dL — ABNORMAL LOW (ref 45–182)
Saturation Ratios: 10 % — ABNORMAL LOW (ref 17.9–39.5)
TIBC: 267 ug/dL (ref 250–450)
UIBC: 241 ug/dL

## 2020-08-22 LAB — FERRITIN: Ferritin: 242 ng/mL (ref 24–336)

## 2020-08-22 IMAGING — DX DG CHEST 1V PORT
1 series · 1 of 1 positions shown · non-contrast
Comparison: PA and lateral chest and CT chest [DATE].

CLINICAL DATA: Lower extremity edema.

EXAM:
PORTABLE CHEST 1 VIEW

[chest ap]
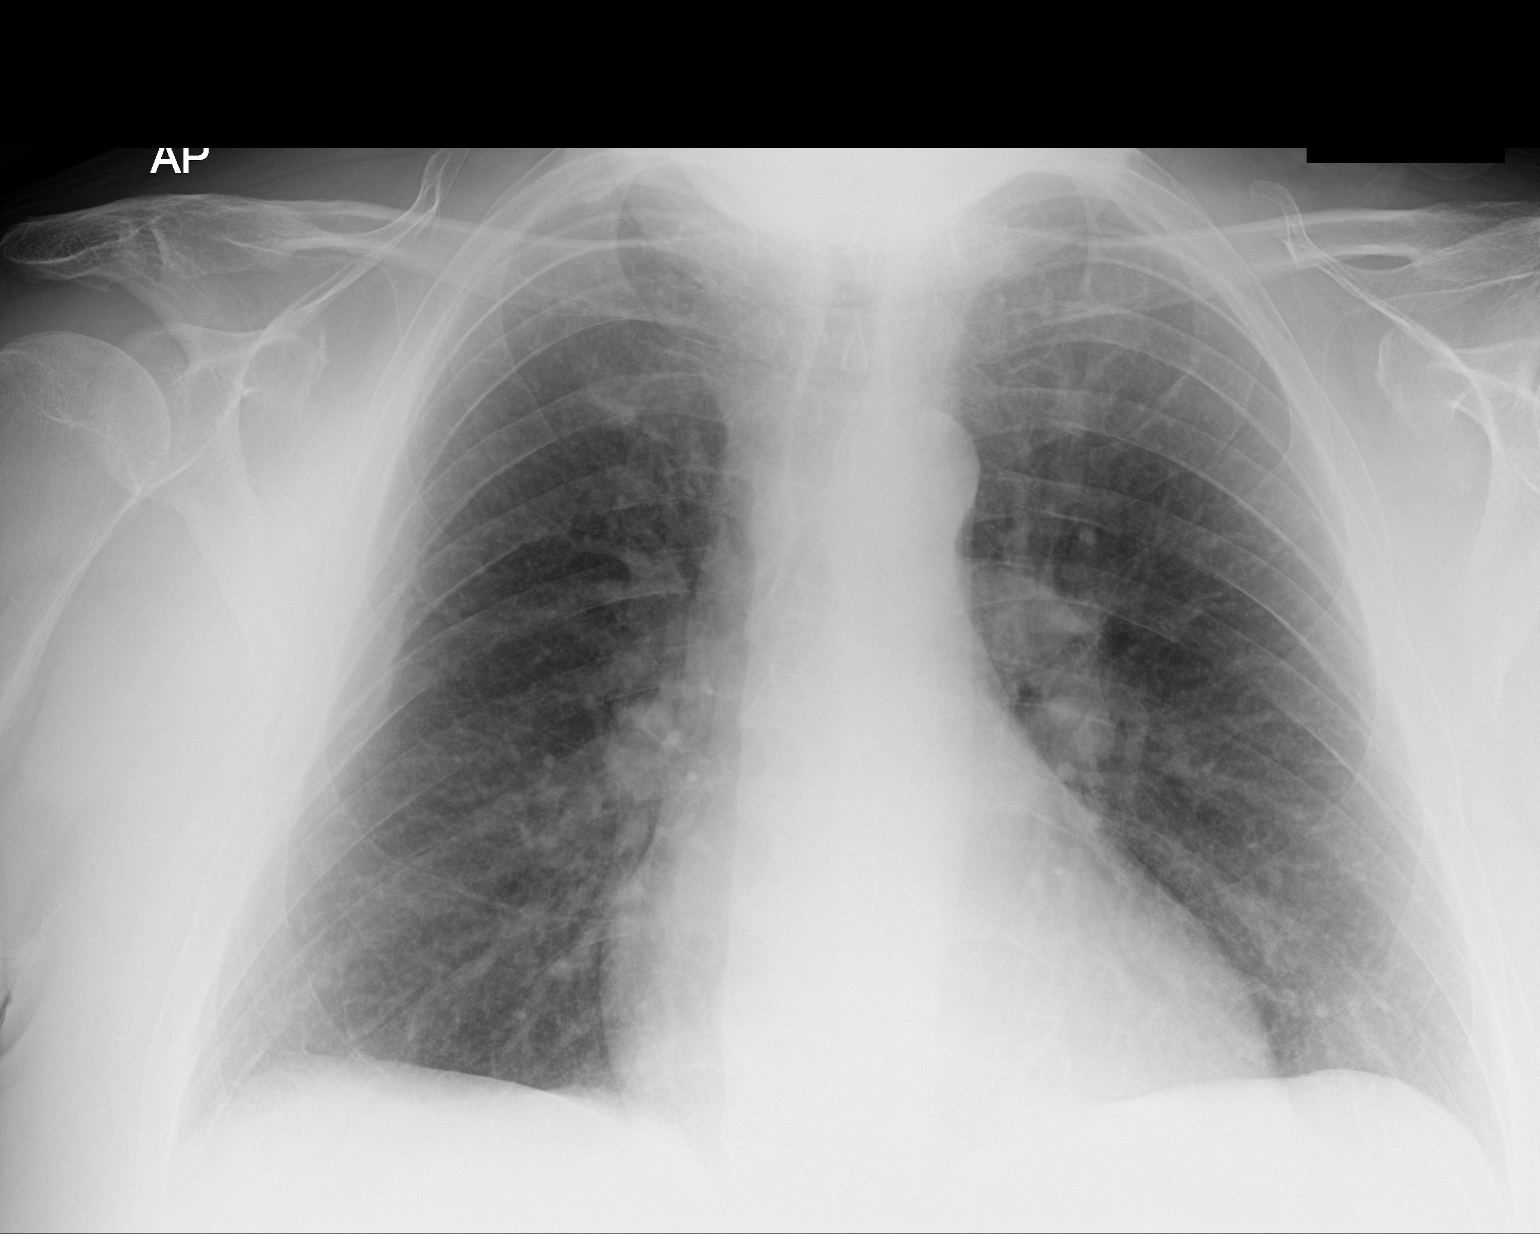

[1 of 1 positions shown; findings below may reference images not displayed]

FINDINGS: Lungs clear. Heart size normal. No pneumothorax or pleural fluid. No
acute or focal bony abnormality.
IMPRESSION: Negative chest.

## 2020-08-22 MED ORDER — SPIRONOLACTONE 100 MG PO TABS: 100.0000 mg | ORAL_TABLET | Freq: Every day | ORAL | 1 refills | Status: DC

## 2020-08-22 NOTE — Telephone Encounter (Signed)
Case discussed with Arlean Hopping.  CBC unremarkable, and remainder of labs still pending.  Plan to increase diuretics by increasing Aldactone to 100 mg/day.  Keep Lasix at 40 mg/day for now.  Recheck BMP in 7 days, and if normal but ongoing edema, plan to scale up on both Lasix and Aldactone.  Please obtain expedited follow-up appointment with me sometime next week.

## 2020-08-22 NOTE — ED Provider Notes (Signed)
[facility] EMERGENCY DEPARTMENT Provider Note   CSN: [id number] Arrival date & time: 08/22/20  1049     History Chief Complaint  Patient presents with  . Leg Pain    Darryl Velazquez is a 60 y.o. male.  The history is provided by the patient and the spouse.        Darryl Velazquez is a 60 y.o. male, with a history of chronic venous insufficiency, hemochromatosis, dyslipidemia, morbid obesity, stroke, presenting to the ED accompanied by his wife with bilateral lower extremity edema worsening over the last 4 weeks. Patient states he has had lower extremity edema for the last couple years.  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 He was scheduled for an appointment today with his GI specialist, Dr. Bryan Lemma, but when he arrived he was told his appointment time was incorrect and was advised to come to the ED for further evaluation. Patient states he had an echo performed by a cardiologist in [place] and this showed no abnormalities that would explain patient's edema. Denies fever/chills, other lower extremity pain, lower extremity erythema, orthopnea, chest pain, shortness of breath, abdominal pain, abdominal distention, difficulty urinating, confusion, syncope, or any other complaints.   Past Medical History:  Diagnosis Date  . Cerebrovascular disease   . Chronic venous insufficiency   . Dyslipidemia   . Hemochromatosis, hereditary ([place])   . Medication intolerance   . Morbid obesity ([place])   . Nicotine dependence   . Nonalcoholic steatohepatitis   . Peripheral vascular disease ([place])   . Pneumothorax, traumatic   . Prostate cancer ([place])   . 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 Sleep apnea   . Stroke [facility])     2 prior to age 38.  . Venous stasis dermatitis of both lower extremities     Patient Active Problem List   Diagnosis Date Noted  . Cerebrovascular disease   . Chronic venous insufficiency   . Dyslipidemia   . Hemochromatosis, hereditary ([place])   . Medication intolerance   . Morbid obesity ([place])   . Nicotine dependence   . Nonalcoholic steatohepatitis   . Peripheral vascular disease ([place])   . Pneumothorax, traumatic   . Prostate cancer ([place])   . Prostatic adenocarcinoma ([place])   . Sleep apnea   . Venous stasis dermatitis of both lower extremities   . Hyperestrogenism 09/01/2019  . Erectile dysfunction after radical prostatectomy 08/19/2018  . Hypercoagulable state ([place]) 08/19/2018  . NAFLD (nonalcoholic fatty liver disease) 08/19/2018  . Pelvic floor dysfunction 08/19/2018  . Personal history of prostate cancer 08/19/2018  . Tobacco use disorder 08/19/2018  . Elevated liver enzymes 03/27/2016  . OSA on CPAP 03/27/2016  . Class 2 severe obesity due to excess calories with serious comorbidity and body mass index (BMI) of 39.0 to 39.9 in adult ([place]) 03/11/2016  . Arthritis 03/10/2016  . Cancer of prostate with high recurrence risk (stage T3a or Gleason 8-10 or PSA > 20) 03/10/2016  . Elevated ferritin 03/10/2016  . Stroke [facility]) 03/10/2016    Past Surgical History:  Procedure Laterality Date  . COLONOSCOPY  09/24/2017  . KNEE SURGERY Right   . PENILE PROSTHESIS IMPLANT  10/11/2019  . PROSTATE BIOPSY  01/29/2016   Transurethral  . prostatectomy  04/10/2016   Abdominal       Family History  Problem Relation Age of Onset  . Diabetes Father   . Kidney failure Father   . Diabetes Sister   . Hypertension Sister   . Colon cancer Neg Hx   . Esophageal cancer Neg Hx   . Prostate cancer Neg Hx   . Rectal cancer Neg Hx   . Stomach cancer Neg Hx     Social History   Tobacco Use  . Smoking status: Former Smoker    Quit date: 11/2018    Years since quitting: 1.7  .  Smokeless tobacco: Never Used  Vaping Use  . Vaping Use: Former  Substance Use Topics  . Alcohol use: Not Currently  . Drug use: Never    Home Medications Prior to Admission medications   Medication Sig Start Date End Date Taking? Authorizing Provider  spironolactone (ALDACTONE) 100 MG tablet Take 1 tablet (100 mg total) by mouth daily. 08/22/20 10/21/20 Yes Annalysia Willenbring C, PA-C  aspirin EC 81 MG tablet Take 81 mg by mouth daily. Swallow whole.    [provider]  furosemide (LASIX) 40 MG tablet Take 1 tablet (40 mg total) by mouth daily. 07/05/20   Cirigliano, Vito V, DO  pantoprazole (PROTONIX) 40 MG tablet Take 1 tablet (40 mg total) by mouth 2 (two) times daily. Take 40 mg tablet twice daily for 8 weeks, then take daily Patient taking differently: Take 40 mg by mouth daily. Take 40 mg tablet twice daily for 8 weeks, then take daily 05/20/20   Cirigliano, Vito V, DO  rosuvastatin (CRESTOR) 5 MG tablet Take 5 mg by mouth every other day.    [provider]    Allergies    Patient has no known allergies.  Review of Systems   Review of Systems  Constitutional: Negative for chills, diaphoresis and fever.  Respiratory: Negative for cough and shortness of breath.   Cardiovascular: Positive for leg swelling. Negative for chest pain.  Gastrointestinal: Negative for abdominal distention, abdominal pain, diarrhea, nausea and vomiting.  Genitourinary: Negative for decreased urine volume and difficulty urinating.  Neurological: Negative for syncope, weakness and numbness.  All other systems reviewed and are negative.   Physical Exam Updated Vital Signs BP (!) 158/82 (BP Location: Left Arm)   Pulse 74   Temp 98.3 F (36.8 C) (Oral)   Resp 16   SpO2 100%   Physical Exam Vitals and nursing note reviewed.  Constitutional:      General: He is not in acute distress.    Appearance: He is well-developed. He is obese. He is not diaphoretic.  HENT:     Head: Normocephalic  and atraumatic.     Mouth/Throat:     Mouth: Mucous membranes are moist.     Pharynx: Oropharynx is clear.  Eyes:     Conjunctiva/sclera: Conjunctivae normal.  Cardiovascular:     Rate and Rhythm: Normal rate and regular rhythm.     Pulses:          Radial pulses are 2+ on the right side and 2+ on the left side.       Dorsalis pedis pulses are 1+ on the right side and 1+ on the left side.     Heart sounds: Normal heart sounds.     Comments: Tactile temperature in the extremities appropriate and equal bilaterally. Pulmonary:     Effort: Pulmonary effort is normal. No respiratory  distress.     Breath sounds: Normal breath sounds.  Abdominal:     General: There is no distension.     Palpations: Abdomen is soft.     Tenderness: There is no abdominal tenderness. There is no guarding.  Musculoskeletal:     Cervical back: Neck supple.     Right lower leg: 3+ Edema present.     Left lower leg: 3+ Pitting Edema present.     Comments: Edema in the bilateral lower extremities without tenderness, erythema, increased warmth.  Lymphadenopathy:     Cervical: No cervical adenopathy.  Skin:    General: Skin is warm and dry.  Neurological:     Mental Status: He is alert.     Comments: Sensation to light touch grossly intact in the bilateral lower extremities. Strength 5/5 in the lower extremities.  Psychiatric:        Mood and Affect: Mood and affect normal.        Speech: Speech normal.        Behavior: Behavior normal.     ED Results / Procedures / Treatments   Labs (all labs ordered are listed, but only abnormal results are displayed) Labs Reviewed  COMPREHENSIVE METABOLIC PANEL - Abnormal; Notable for the following components:      Result Value   Sodium 134 (*)    Glucose, Bld 111 (*)    Calcium 8.5 (*)    Total Protein 6.4 (*)    Albumin 2.8 (*)    AST 60 (*)    Alkaline Phosphatase 160 (*)    All other components within normal limits  CBC WITH DIFFERENTIAL/PLATELET -  Abnormal; Notable for the following components:   RBC 3.55 (*)    Hemoglobin 10.3 (*)    HCT 31.1 (*)    All other components within normal limits  URINALYSIS, ROUTINE W REFLEX MICROSCOPIC - Abnormal; Notable for the following components:   Color, Urine AMBER (*)    Specific Gravity, Urine >1.030 (*)    All other components within normal limits  FERRITIN  IRON AND TIBC   Hemoglobin  Date Value Ref Range Status  08/22/2020 10.3 (L) 13.0 - 17.0 g/dL Final  08/09/2020 10.4 (L) 13.0 - 17.0 g/dL Final  07/26/2020 10.6 (L) 13.0 - 17.0 g/dL Final  06/19/2020 12.9 (L) 13.0 - 17.0 g/dL Final  05/16/2020 12.9 (L) 13.0 - 17.0 g/dL Final  05/08/2020 13.8 13.0 - 17.0 g/dL Final    BUN  Date Value Ref Range Status  08/22/2020 14 6 - 20 mg/dL Final  08/09/2020 10 6 - 20 mg/dL Final  07/26/2020 12 6 - 20 mg/dL Final  06/19/2020 14 6 - 20 mg/dL Final  04/19/2020 11 6 - 24 mg/dL Final   Creatinine  Date Value Ref Range Status  08/09/2020 0.92 0.61 - 1.24 mg/dL Final  07/26/2020 0.93 0.61 - 1.24 mg/dL Final  06/19/2020 0.85 0.61 - 1.24 mg/dL Final   Creatinine, Ser  Date Value Ref Range Status  08/22/2020 0.76 0.61 - 1.24 mg/dL Final  04/19/2020 0.75 (L) 0.76 - 1.27 mg/dL Final    EKG None  Radiology DG Chest Portable 1 View  Result Date: 08/22/2020 CLINICAL DATA:  Lower extremity edema. EXAM: PORTABLE CHEST 1 VIEW COMPARISON:  PA and lateral chest and CT chest 03/02/2020. FINDINGS: Lungs clear. Heart size normal. No pneumothorax or pleural fluid. No acute or focal bony abnormality. IMPRESSION: Negative chest. Electronically Signed   By: Inge Rise M.D.   On: 08/22/2020 12:42  Procedures Procedures   Medications Ordered in ED Medications - No data to display  ED Course  I have reviewed the triage vital signs and the nursing notes.  Pertinent labs & imaging results that were available during my care of the patient were reviewed by me and considered in my medical  decision making (see chart for details).  Clinical Course as of 08/22/20 1405  Thu Aug 22, 2020  1234 Spoke with Dr. Bryan Lemma, patient's gastroenterologist with whom he was supposed to have an appointment today. We discussed the patient's symptoms and lab work.  Recommends increasing the spironolactone to 100 mg once daily. His office will make contact with the patient tomorrow to check up on him. Reevaluation in the office with repeat BMP next week. [SJ]    Clinical Course User Index [SJ] Pierre Dellarocco, Helane Gunther, PA-C   MDM Rules/Calculators/A&P                          Patient presents with worsened bilateral lower extremity edema for the past 4 weeks. Patient is nontoxic appearing, afebrile, not tachycardic, not tachypneic, not hypotensive, maintains excellent SPO2 on room air, and is in no apparent distress.   I have reviewed the patient's chart to obtain more information.   I reviewed and interpreted the patient's labs and radiological studies. Chest x-ray without evidence of pulmonary edema. Lab work overall consistent with previous values. Upon advice of patient's gastroenterologist, we will increase his spironolactone and he will have follow-up in the office next week. Return precautions discussed.  Patient voices understanding of these instructions, accepts the plan, and is comfortable with discharge.    Final Clinical Impression(s) / ED Diagnoses Final diagnoses:  Bilateral lower extremity edema    Rx / DC Orders ED Discharge Orders         Ordered    spironolactone (ALDACTONE) 100 MG tablet  Daily        08/22/20 1404           Lorayne Bender, PA-C 08/22/20 1409    Hayden Rasmussen, MD 08/22/20 2048

## 2020-08-22 NOTE — Telephone Encounter (Signed)
Left a voicemail letting Darryl Velazquez know I scheduled him a follow up in the office on 08/27/2020 at 10:40. Asked him to call the office if he is unable to keep this appoinment

## 2020-08-22 NOTE — Telephone Encounter (Signed)
Hey Dr Wenda Overland from [facility] is requesting for you to speak with the PA Arlean Hopping regarding this pt.  [phone]

## 2020-08-22 NOTE — ED Triage Notes (Signed)
Pt with chronic liver issues with peripheral edema, states uses compression machine at night, reports increased swelling bilat lower extremities and abdomen.  Followed by GI and pmd

## 2020-08-22 NOTE — Discharge Instructions (Addendum)
Begin taking the 100 mg spironolactone daily instead of the 50 mg of spironolactone daily. Follow-up with Dr. Bryan Lemma on this matter.  Expect to have an office visit and repeat blood work next week.  Return to the emergency department for shortness of breath, chest pain, abdominal pain, persistent vomiting, difficulty urinating, or any other major concerns.

## 2020-08-22 NOTE — ED Notes (Signed)
Called [facility]

## 2020-08-22 NOTE — Telephone Encounter (Signed)
Left a voicemail for patients wife Shirlean Mylar regarding an appointment for 08/27/2020 at 10:40. Asked for her to contact us back to advise if he can be here.

## 2020-08-22 NOTE — ED Notes (Signed)
Pt did well with po challenge, no recurrence of n/v

## 2020-08-23 ENCOUNTER — Telehealth: Payer: Self-pay | Admitting: Family

## 2020-08-23 NOTE — Telephone Encounter (Signed)
Tried to contact Darryl Velazquez the patient's wife LVM to advise him of his appointment that I scheduled 08/27/2020 @10 :40am

## 2020-08-23 NOTE — Telephone Encounter (Signed)
Due to provider being on [facility] 4/8.  Appointments were rescheduled & updated calendar/letter was mailed

## 2020-08-23 NOTE — Telephone Encounter (Signed)
Patient's wife calling back requesting a call back please in regards to patient's appt.

## 2020-08-23 NOTE — Telephone Encounter (Signed)
The patient states he is just as swollen as he was yesterday. He is not urinating and continues to drink fluids. He stated he has urinated twice today. Patient states he is taking his water pills with no relief. Suggested that he may want to go to the ED again this weekend if no relief in symptons.  The patient is aware of his appointment on Tuesday and will be here.

## 2020-08-27 ENCOUNTER — Other Ambulatory Visit (INDEPENDENT_AMBULATORY_CARE_PROVIDER_SITE_OTHER): Payer: BC Managed Care – PPO

## 2020-08-27 ENCOUNTER — Other Ambulatory Visit: Payer: Self-pay

## 2020-08-27 ENCOUNTER — Encounter: Payer: Self-pay | Admitting: Gastroenterology

## 2020-08-27 ENCOUNTER — Ambulatory Visit (INDEPENDENT_AMBULATORY_CARE_PROVIDER_SITE_OTHER): Payer: BC Managed Care – PPO | Admitting: Gastroenterology

## 2020-08-27 VITALS — BP 124/58 | HR 64 | Ht 76.0 in | Wt 320.5 lb

## 2020-08-27 DIAGNOSIS — R600 Localized edema: Secondary | ICD-10-CM

## 2020-08-27 DIAGNOSIS — K7460 Unspecified cirrhosis of liver: Secondary | ICD-10-CM | POA: Diagnosis not present

## 2020-08-27 DIAGNOSIS — K766 Portal hypertension: Secondary | ICD-10-CM | POA: Diagnosis not present

## 2020-08-27 DIAGNOSIS — K7581 Nonalcoholic steatohepatitis (NASH): Secondary | ICD-10-CM | POA: Diagnosis not present

## 2020-08-27 DIAGNOSIS — K3189 Other diseases of stomach and duodenum: Secondary | ICD-10-CM

## 2020-08-27 LAB — BASIC METABOLIC PANEL
BUN: 12 mg/dL (ref 6–23)
CO2: 27 mEq/L (ref 19–32)
Calcium: 8.7 mg/dL (ref 8.4–10.5)
Chloride: 103 mEq/L (ref 96–112)
Creatinine, Ser: 0.82 mg/dL (ref 0.40–1.50)
GFR: 96.16 mL/min (ref >60.00)
Glucose, Bld: 105 mg/dL — ABNORMAL HIGH (ref 70–99)
Potassium: 3.9 mEq/L (ref 3.5–5.1)
Sodium: 135 mEq/L (ref 135–145)

## 2020-08-27 MED ORDER — SPIRONOLACTONE 50 MG PO TABS: 50.0000 mg | ORAL_TABLET | Freq: Every day | ORAL | 1 refills | Status: DC

## 2020-08-27 MED ORDER — FUROSEMIDE 80 MG PO TABS: 80.0000 mg | ORAL_TABLET | ORAL | 1 refills | Status: DC

## 2020-08-27 NOTE — Progress Notes (Signed)
P  Chief Complaint:    Cirrhosis, ER follow-up, lower extremity edema  GI History: 60 year old male with a history of prostate cancer (surgery only), history of CVA(1st at age 51), obesity (BMI 38),  hereditary hemochromatosis, dyslipidemia, peripheral vascular disease, sleep apnea.    -Appears he has had mildly elevated liver enzymes since 03/2016 (AST/ALT 56/67) with normal T bili 0.6 at that time. -04/2020: Evaluated by Dr. [name] in Cardiology clinic for new onset LE edema.  TTE unremarkable.  Started on diuretics. -Follows in the Hematology clinic for hemochromatosis with heterozygous H63D mutation.  Started weekly phlebotomy in 06/2020.  Cirrhosis Evaluation: - Etiology: NASH.  Questionable contribution from hemochromatosis - Complications: Radiographic ascites, portal hypertensive gastropathy, lower extremity edema - HCC screening: UTD.  Elevated AFP, but MRI/MRCP 05/2020 without hepatoma - Variceal screening: EGD 05/2020.  No EV - Serologic evaluation: Completed 05/2020. Hemochromatosis DNA positive for 1 H63D (carrier of hemochromatosis). -Elevated IgA (856), elevated IgG (2209), elevated/positive antimitochondrial antibody (43.2), and elevated AFP (12.7). Ferritin 419 (previously 968), iron saturation at 62.5% . Normal/negative ANA, ASMA, ceruloplasmin, viral hepatitis panel, IgM, HIV - Viral hepatitis vaccination: Negative HBsAb, HAVAb.  Giving vaccine series today - Flu vaccine: UTD - Liver biopsy: 05/16/2020: Chronic minimally active hepatitis with prominent bridging septa and focal nodularity suggestive of cirrhosis (grade 1, stage IV).  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  Plan for continued observation every 6 months.  Imaging: -Abdominal ultrasound (09/2019): Hepatomegaly with cirrhotic liver morphology. No suspicious hepatic lesions. -CT angio (12/16/2018): Mild distention of GB, no focal liver abnormality but concern for nodular liver contour. Normal pancreas, normal GI tract. Mildly prominent LN near gastrohepatic ligament similar to previous study. Mild large amount of lymph nodes in the lower pelvis and inguinal region. -RUQ [place] (04/24/2020): Markedly heterogenous liverwith appearance compatible with cirrhosis,hypoechoic area, potentially a vascular structure measuring 12 mm. Small volume ascites. Recommend CT or MRI. -MRI liver (05/2020): Coarse, nodular liver consistent with cirrhosis without other features of portal hypertension.  Trace perihepatic ascites.  No liver mass   Endoscopic history: -Colonoscopy x2, last being ~3 years ago in [place] and n/f polyps, with plan to repeat 5 years.No records for review.  -EGD (05/2020): Normal esophagus, moderate portal hypertensive gastritis, moderate duodenitis.  Referred to ENT for polypoid lesion of base of tongue.  Started high-dose PPI  FHx: - Father with stomach CA in his 40's  HPI:     Patient is a 60 y.o. male presenting to the Gastroenterology Clinic for follow-up.  Last seen by me on 06/27/2020.  Restarted Lasix 40 mg/day (he was not taking).  Had an appointment scheduled with me last week, but due to concerns of continued LE edema, he was instead seen in the ER.  CXR w/o acute CP disease and evaluation was otherwise unremarkable. Aldactone increased to 100 mg/day along with continued Lasix 40 mg/day.    Today, he states he continues to have ongoing LBP edema.  Symptoms have worsened in part due to increased work and being on his feet lately.  He works doing a week, then tries to rest with LE elevated through the weekend.  Uses compression device  throughout the weekend  and most evenings.  Continues to practice low-sodium diet.  Iron panel 08/22/2020 much improved, with sat 10%, ferritin 242.  Follows in the Hematology clinic.  Liver enzymes largely stable but improvement in T bili, now 1.2.  Review of systems:     No chest pain, no SOB, no fevers, no urinary sx   Past Medical History:  Diagnosis Date  . Cerebrovascular disease   . Chronic venous insufficiency   . Dyslipidemia   . Hemochromatosis, hereditary ([place])   . Medication intolerance   . Morbid obesity ([place])   . Nicotine dependence   . Nonalcoholic steatohepatitis   . Peripheral vascular disease ([place])   . Pneumothorax, traumatic   . Prostate cancer ([place])   . 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 Sleep apnea   . Stroke [facility])    2 prior to age 80.  . Venous stasis dermatitis of both lower extremities     Patient's surgical history, family medical history, social history, medications and allergies were all reviewed in Epic    Current Outpatient Medications  Medication Sig Dispense Refill  . ALPRAZolam (XANAX) 0.25 MG tablet Take 0.25 mg by mouth 2 (two) times daily as needed.    Marland Kitchen aspirin EC 81 MG tablet Take 81 mg by mouth daily. Swallow whole.    . rosuvastatin (CRESTOR) 5 MG tablet Take 5 mg by mouth every other day.    . spironolactone (ALDACTONE) 100 MG tablet Take 1 tablet (100 mg total) by mouth daily. 30 tablet 1  . pantoprazole (PROTONIX) 40 MG tablet Take 1 tablet (40 mg total) by mouth 2 (two) times daily. Take 40 mg tablet twice daily for 8 weeks, then take daily (Patient not taking: Reported on 08/27/2020) 60 tablet 4   Current Facility-Administered Medications  Medication Dose Route Frequency Provider Last Rate Last Admin  . triamcinolone acetonide (KENALOG) 10 MG/ML injection 10 mg  10 mg Other Once Landis Martins, DPM        Physical Exam:     BP (!) 124/58   Pulse 64   Ht 6' 4"  (1.93 m)   Wt (!) 320 lb 8 oz (145.4 kg)   BMI 39.01 kg/m    GENERAL:  Pleasant male in NAD PSYCH: : Cooperative, normal affect EXTREM: Pitting bilateral lower extremity edema  IMPRESSION and PLAN:    1) NASH Cirrhosis 2) Lower extremity edema 3) Heterozygous carrier for hemochromatosis/Iron overload 4) Portal hypertensive gastropathy 5) Radiographic ascites  - Check BMP today and in 7 days -Increase Lasix to 40 mg/daily and 80 mg QOD and increase aldactone to 150 mg/day.  If ongoing edema, plan for 80 mg BID. -Nephrology referral for assistance in edema evaluation and management with escalating doses of diuretics - Patient really wants (needs to) to keep working.  Unfortunately this continues to exacerbate his LE edema - Continue LE compression at nights after work and weekends - Elevate LE when able - Continue <2 gm Na diet -Continue f/u with [facility] as planned -Continue f/u in Hematology Clinic  -Continue phlebotomy per Hematology clinic -To complete HAV/HBV vaccine series as scheduled -Due to elevated AFP, recommend repeat MRI liver in 6 months (11/2020) for ongoing [place] screening.  If again normal, okay to return to ultrasound 6 months later. -Repeat EGD in 2023 for surveillance per Hepatology Clinic recommendations   - RTC in 3 months or sooner prn  I spent 45 minutes of time, including in depth chart review, independent review of results as outlined  above, communicating results with the patient directly, face-to-face time with the patient, coordinating care, ordering studies and medications as appropriate, and documentation.          Lavena Bullion ,DO, FACG 08/27/2020, 10:44 AM

## 2020-08-27 NOTE — Patient Instructions (Signed)
If you are age 60 or older, your body mass index should be between 23-30. Your Body mass index is 39.01 kg/m. If this is out of the aforementioned range listed, please consider follow up with your Primary Care Provider.  If you are age 1 or younger, your body mass index should be between 19-25. Your Body mass index is 39.01 kg/m. If this is out of the aformentioned range listed, please consider follow up with your Primary Care Provider.    We have sent the following medications to your pharmacy for you to pick up at your convenience:  Aldactone (spironalactone) 150 mg daily Lasix (furosemide) 40 mg one day, then 100m the next. Every other day 458m80mg.  You will hear from Nephrology to schedule and appointment.  Please go to the 2nd floor of this building today and schedule your labwork, [address].  Labwork today then in 7 days  Return to clinic in 3 months or sooner if needed.  Thank you for choosing [facility] and [facility].  Vito Cirigliano, D.O.

## 2020-08-28 ENCOUNTER — Telehealth: Payer: Self-pay | Admitting: General Surgery

## 2020-08-28 NOTE — Telephone Encounter (Signed)
Notified the patient that his BMP was normal and he should continue with his diuretics as directed at yesterdays visit. The patient verbalized understanding.

## 2020-08-28 NOTE — Telephone Encounter (Signed)
-----   Message from Lavena Bullion, DO sent at 08/28/2020 12:29 PM EDT ----- BMP looks good. Continue plan for increasing diuretics as outlined in clinic yesterday.

## 2020-08-30 ENCOUNTER — Other Ambulatory Visit: Payer: BC Managed Care – PPO

## 2020-09-02 ENCOUNTER — Telehealth: Payer: Self-pay | Admitting: General Surgery

## 2020-09-02 NOTE — Telephone Encounter (Signed)
Faxed request for dx codes to [facility] for labs drawn on 05/08/2020 for Hep B surface AG and Hep C AB-codes R74.8 and E80.6

## 2020-09-04 ENCOUNTER — Inpatient Hospital Stay: Payer: BC Managed Care – PPO

## 2020-09-04 ENCOUNTER — Other Ambulatory Visit: Payer: Self-pay

## 2020-09-04 ENCOUNTER — Other Ambulatory Visit (INDEPENDENT_AMBULATORY_CARE_PROVIDER_SITE_OTHER): Payer: BC Managed Care – PPO

## 2020-09-04 ENCOUNTER — Telehealth: Payer: Self-pay | Admitting: General Surgery

## 2020-09-04 ENCOUNTER — Telehealth: Payer: Self-pay

## 2020-09-04 DIAGNOSIS — R600 Localized edema: Secondary | ICD-10-CM

## 2020-09-04 DIAGNOSIS — K7460 Unspecified cirrhosis of liver: Secondary | ICD-10-CM

## 2020-09-04 LAB — CBC WITH DIFFERENTIAL (CANCER CENTER ONLY)
Abs Immature Granulocytes: 0.03 10*3/uL (ref 0.00–0.07)
Basophils Absolute: 0.1 10*3/uL (ref 0.0–0.1)
Basophils Relative: 2 %
Eosinophils Absolute: 1.5 10*3/uL — ABNORMAL HIGH (ref 0.0–0.5)
Eosinophils Relative: 21 %
HCT: 31.9 % — ABNORMAL LOW (ref 39.0–52.0)
Hemoglobin: 11 g/dL — ABNORMAL LOW (ref 13.0–17.0)
Immature Granulocytes: 0 %
Lymphocytes Relative: 21 %
Lymphs Abs: 1.5 10*3/uL (ref 0.7–4.0)
MCH: 29 pg (ref 26.0–34.0)
MCHC: 34.5 g/dL (ref 30.0–36.0)
MCV: 84.2 fL (ref 80.0–100.0)
Monocytes Absolute: 1.4 10*3/uL — ABNORMAL HIGH (ref 0.1–1.0)
Monocytes Relative: 19 %
Neutro Abs: 2.7 10*3/uL (ref 1.7–7.7)
Neutrophils Relative %: 37 %
Platelet Count: 228 10*3/uL (ref 150–400)
RBC: 3.79 MIL/uL — ABNORMAL LOW (ref 4.22–5.81)
RDW: 14.7 % (ref 11.5–15.5)
WBC Count: 7.2 10*3/uL (ref 4.0–10.5)
nRBC: 0 % (ref 0.0–0.2)

## 2020-09-04 LAB — CMP (CANCER CENTER ONLY)
ALT: 31 U/L (ref 0–44)
AST: 52 U/L — ABNORMAL HIGH (ref 15–41)
Albumin: 3.1 g/dL — ABNORMAL LOW (ref 3.5–5.0)
Alkaline Phosphatase: 149 U/L — ABNORMAL HIGH (ref 38–126)
Anion gap: 7 (ref 5–15)
BUN: 13 mg/dL (ref 6–20)
CO2: 29 mmol/L (ref 22–32)
Calcium: 9.6 mg/dL (ref 8.9–10.3)
Chloride: 101 mmol/L (ref 98–111)
Creatinine: 0.89 mg/dL (ref 0.61–1.24)
GFR, Estimated: >60 mL/min (ref >60)
Glucose, Bld: 110 mg/dL — ABNORMAL HIGH (ref 70–99)
Potassium: 4.3 mmol/L (ref 3.5–5.1)
Sodium: 137 mmol/L (ref 135–145)
Total Bilirubin: 1.4 mg/dL — ABNORMAL HIGH (ref 0.3–1.2)
Total Protein: 7.3 g/dL (ref 6.5–8.1)

## 2020-09-04 LAB — BASIC METABOLIC PANEL
BUN: 13 mg/dL (ref 6–23)
CO2: 26 mEq/L (ref 19–32)
Calcium: 9.9 mg/dL (ref 8.4–10.5)
Chloride: 100 mEq/L (ref 96–112)
Creatinine, Ser: 0.84 mg/dL (ref 0.40–1.50)
GFR: 95.44 mL/min (ref >60.00)
Glucose, Bld: 111 mg/dL — ABNORMAL HIGH (ref 70–99)
Potassium: 4.1 mEq/L (ref 3.5–5.1)
Sodium: 133 mEq/L — ABNORMAL LOW (ref 135–145)

## 2020-09-04 LAB — IRON AND TIBC
Iron: 45 ug/dL (ref 42–163)
Saturation Ratios: 15 % — ABNORMAL LOW (ref 20–55)
TIBC: 294 ug/dL (ref 202–409)
UIBC: 249 ug/dL (ref 117–376)

## 2020-09-04 LAB — FERRITIN: Ferritin: 311 ng/mL (ref 24–336)

## 2020-09-04 NOTE — Telephone Encounter (Signed)
-----   Message from Eliezer Bottom, NP sent at 09/04/2020 10:35 AM EDT ----- He needs to donate once. I think he goes to [facility]. Let me know if he needs a prescription sent there.   Sarah  ----- Message ----- From: Buel Ream, Lab In [place] Sent: 09/04/2020   8:02 AM EDT To: Eliezer Bottom, NP

## 2020-09-04 NOTE — Telephone Encounter (Signed)
Called and informed patient of lab results and too donate once with [facility], he confirmed states he will go on Saturday. And denies any other questions or concerns at this time.

## 2020-09-04 NOTE — Telephone Encounter (Signed)
Spoke with the patients wife Shirlean Mylar and explained that Davids BMP is stable and Dr Bryan Lemma would like for him to increase his Lasix to 80 mg daily. He is to repeat his BMP in 7 days.  Shirlean Mylar stated that she had a text from Nephorology and has yet to schedule his appointment. She will call me when that is scheduled to give me the date.  Robin verbalized understanding.

## 2020-09-04 NOTE — Telephone Encounter (Signed)
-----   Message from Lykens, DO sent at 09/04/2020 12:07 PM EDT ----- BMP stable without any significant electrolyte derangements and good renal function.  Continue current diuretics as prescribed.  If no improvement in edema despite recent increase in Lasix and Aldactone, can increase Lasix to 80 mg daily (currently alternating 40 mg and 80 mg each day) with repeat BMP 7 days later.  Also check on status of Nephrology referral.  Thanks

## 2020-09-05 ENCOUNTER — Other Ambulatory Visit: Payer: BC Managed Care – PPO

## 2020-09-13 ENCOUNTER — Other Ambulatory Visit: Payer: BC Managed Care – PPO

## 2020-09-13 ENCOUNTER — Ambulatory Visit: Payer: BC Managed Care – PPO | Admitting: Family

## 2020-09-27 ENCOUNTER — Telehealth: Payer: Self-pay | Admitting: *Deleted

## 2020-09-27 ENCOUNTER — Encounter: Payer: Self-pay | Admitting: Family

## 2020-09-27 ENCOUNTER — Other Ambulatory Visit: Payer: Self-pay

## 2020-09-27 ENCOUNTER — Inpatient Hospital Stay (HOSPITAL_BASED_OUTPATIENT_CLINIC_OR_DEPARTMENT_OTHER): Payer: BC Managed Care – PPO | Admitting: Family

## 2020-09-27 ENCOUNTER — Inpatient Hospital Stay: Payer: BC Managed Care – PPO | Attending: Family

## 2020-09-27 DIAGNOSIS — R809 Proteinuria, unspecified: Secondary | ICD-10-CM | POA: Diagnosis not present

## 2020-09-27 DIAGNOSIS — Z79899 Other long term (current) drug therapy: Secondary | ICD-10-CM | POA: Insufficient documentation

## 2020-09-27 DIAGNOSIS — R609 Edema, unspecified: Secondary | ICD-10-CM | POA: Diagnosis not present

## 2020-09-27 DIAGNOSIS — R42 Dizziness and giddiness: Secondary | ICD-10-CM | POA: Diagnosis not present

## 2020-09-27 DIAGNOSIS — M2550 Pain in unspecified joint: Secondary | ICD-10-CM | POA: Diagnosis not present

## 2020-09-27 DIAGNOSIS — Z9181 History of falling: Secondary | ICD-10-CM | POA: Diagnosis not present

## 2020-09-27 DIAGNOSIS — L853 Xerosis cutis: Secondary | ICD-10-CM | POA: Insufficient documentation

## 2020-09-27 DIAGNOSIS — K7460 Unspecified cirrhosis of liver: Secondary | ICD-10-CM | POA: Diagnosis not present

## 2020-09-27 DIAGNOSIS — E876 Hypokalemia: Secondary | ICD-10-CM | POA: Diagnosis not present

## 2020-09-27 LAB — CMP (CANCER CENTER ONLY)
ALT: 39 U/L (ref 0–44)
AST: 57 U/L — ABNORMAL HIGH (ref 15–41)
Albumin: 2.9 g/dL — ABNORMAL LOW (ref 3.5–5.0)
Alkaline Phosphatase: 139 U/L — ABNORMAL HIGH (ref 38–126)
Anion gap: 6 (ref 5–15)
BUN: 16 mg/dL (ref 6–20)
CO2: 25 mmol/L (ref 22–32)
Calcium: 9.1 mg/dL (ref 8.9–10.3)
Chloride: 99 mmol/L (ref 98–111)
Creatinine: 0.93 mg/dL (ref 0.61–1.24)
GFR, Estimated: >60 mL/min (ref >60)
Glucose, Bld: 149 mg/dL — ABNORMAL HIGH (ref 70–99)
Potassium: 3.8 mmol/L (ref 3.5–5.1)
Sodium: 130 mmol/L — ABNORMAL LOW (ref 135–145)
Total Bilirubin: 2.1 mg/dL — ABNORMAL HIGH (ref 0.3–1.2)
Total Protein: 6.8 g/dL (ref 6.5–8.1)

## 2020-09-27 LAB — CBC WITH DIFFERENTIAL (CANCER CENTER ONLY)
Abs Immature Granulocytes: 0.02 10*3/uL (ref 0.00–0.07)
Basophils Absolute: 0.1 10*3/uL (ref 0.0–0.1)
Basophils Relative: 1 %
Eosinophils Absolute: 0.7 10*3/uL — ABNORMAL HIGH (ref 0.0–0.5)
Eosinophils Relative: 12 %
HCT: 30.9 % — ABNORMAL LOW (ref 39.0–52.0)
Hemoglobin: 10.9 g/dL — ABNORMAL LOW (ref 13.0–17.0)
Immature Granulocytes: 0 %
Lymphocytes Relative: 24 %
Lymphs Abs: 1.4 10*3/uL (ref 0.7–4.0)
MCH: 28.5 pg (ref 26.0–34.0)
MCHC: 35.3 g/dL (ref 30.0–36.0)
MCV: 80.9 fL (ref 80.0–100.0)
Monocytes Absolute: 1 10*3/uL (ref 0.1–1.0)
Monocytes Relative: 16 %
Neutro Abs: 2.7 10*3/uL (ref 1.7–7.7)
Neutrophils Relative %: 47 %
Platelet Count: 188 10*3/uL (ref 150–400)
RBC: 3.82 MIL/uL — ABNORMAL LOW (ref 4.22–5.81)
RDW: 16.6 % — ABNORMAL HIGH (ref 11.5–15.5)
WBC Count: 5.9 10*3/uL (ref 4.0–10.5)
nRBC: 0 % (ref 0.0–0.2)

## 2020-09-27 LAB — IRON AND TIBC
Iron: 129 ug/dL (ref 42–163)
Saturation Ratios: 50 % (ref 20–55)
TIBC: 259 ug/dL (ref 202–409)
UIBC: 130 ug/dL (ref 117–376)

## 2020-09-27 LAB — FERRITIN: Ferritin: 301 ng/mL (ref 24–336)

## 2020-09-27 NOTE — Telephone Encounter (Signed)
-----   Message from Eliezer Bottom, NP sent at 09/27/2020  1:11 PM EDT ----- He needs a phlebotomy with [facility] next week and again 2 weeks after that. We will recheck his lab work in a month. I can fill out a form if needed. Thank you!  ----- Message ----- From: Interface, [facility] In [place] Sent: 09/27/2020   8:24 AM EDT To: Eliezer Bottom, NP

## 2020-09-27 NOTE — Telephone Encounter (Signed)
Per result message 09/27/20 Judson Roch mailed calendar

## 2020-09-27 NOTE — Telephone Encounter (Signed)
Per 09/27/20 los  Called and gave upcoming appointments - mailed calendar

## 2020-09-27 NOTE — Telephone Encounter (Signed)
S/w pt, confirmed he will go to [facility]. Judson Roch, will fax Rx for phlebotomy to [facility]. No further concerns

## 2020-09-27 NOTE — Progress Notes (Signed)
Hematology and Oncology Follow Up Visit  Darryl Velazquez [id number] [birth year] 60 y.o. 09/27/2020   Principle Diagnosis:  Hemochromatosis, heterozygous for the H63D mutation  Cirrhosis   Current Therapy:   Phlebotomy with [facility] PRN to maintain ferritin < 100 and iron saturation < 50%   Interim History:  Darryl Velazquez is here today for follow-up. He is doing well and is feeling much better.  He states that he is is on lasix and spironolactone and has lost 40 lbs of fluid weight since the middle of March.  He states that his joint aches and pains are improved. He does have dry skin and states that his legs itch all the time. He will try using gold bond cream or Eucerin to help.  He has occasional lightheadedness and did trip and fall recently. He stoved up the middle finger on his left hand and thinks it might have broken but is just letting it heal on its own.  No syncope to report.  He has not donated for a month or so. He has been quite busy with work but hopes to take early retirement soon.  LFT's are stable. Hgb 10.9, MCV 80, platelets 188 and WBC count 5.9.  No fever, chills, n/v, cough, rash, dizziness, SOB, chest pain, palpitations, abdominal pain or changes in bowel or bladder habits.  He denies any blood loss. No bruising or petechiae.  He has maintained a good appetite and is staying well hydrated throughout the day. He avoids salt.  ECOG Performance Status: 1 - Symptomatic but completely ambulatory  Medications:  Allergies as of 09/27/2020   No Known Allergies     Medication List       Accurate as of September 27, 2020  8:57 AM. If you have any questions, ask your nurse or doctor.        ALPRAZolam 0.25 MG tablet Commonly known as: XANAX Take 0.25 mg by mouth 2 (two) times daily as needed.   aspirin EC 81 MG tablet Take 81 mg by mouth daily. Swallow whole.   furosemide 80 MG tablet Commonly known as: LASIX Take 1 tablet (80 mg total) by mouth every other day.    pantoprazole 40 MG tablet Commonly known as: PROTONIX Take 1 tablet (40 mg total) by mouth 2 (two) times daily. Take 40 mg tablet twice daily for 8 weeks, then take daily   rosuvastatin 5 MG tablet Commonly known as: CRESTOR Take 5 mg by mouth every other day.   spironolactone 100 MG tablet Commonly known as: Aldactone Take 1 tablet (100 mg total) by mouth daily.   spironolactone 50 MG tablet Commonly known as: ALDACTONE Take 1 tablet (50 mg total) by mouth daily. Take 1 tablet daily with your 159m spironalactone prescription       Allergies: No Known Allergies  Past Medical History, Surgical history, Social history, and Family History were reviewed and updated.  Review of Systems: All other 10 point review of systems is negative.   Physical Exam:  height is 6' 4"  (1.93 m) and weight is 291 lb (132 kg). His temperature is 98 F (36.7 C). His blood pressure is 124/62 and his pulse is 68. His respiration is 17 and oxygen saturation is 100%.   Wt Readings from Last 3 Encounters:  09/27/20 291 lb (132 kg)  08/27/20 (!) 320 lb 8 oz (145.4 kg)  08/22/20 (!) 331 lb (150.1 kg)    Ocular: Sclerae unicteric, pupils equal, round and reactive to light Ear-nose-throat: Oropharynx  clear, dentition fair Lymphatic: No cervical or supraclavicular adenopathy Lungs no rales or rhonchi, good excursion bilaterally Heart regular rate and rhythm, no murmur appreciated Abd soft, nontender, positive bowel sounds MSK no focal spinal tenderness, no joint edema Neuro: non-focal, well-oriented, appropriate affect Breasts: Deferred   Lab Results  Component Value Date   WBC 5.9 09/27/2020   HGB 10.9 (L) 09/27/2020   HCT 30.9 (L) 09/27/2020   MCV 80.9 09/27/2020   PLT 188 09/27/2020   Lab Results  Component Value Date   FERRITIN 311 09/04/2020   IRON 45 09/04/2020   TIBC 294 09/04/2020   UIBC 249 09/04/2020   IRONPCTSAT 15 (L) 09/04/2020   Lab Results  Component Value Date   RBC  3.82 (L) 09/27/2020   No results found for: KPAFRELGTCHN, LAMBDASER, [facility] Lab Results  Component Value Date   IGGSERUM 2,209 (H) 05/08/2020   IGMSERUM 195 05/08/2020   No results found for: Kathrynn Ducking, MSPIKE, SPEI   Chemistry      Component Value Date/Time   NA 130 (L) 09/27/2020 0805   NA 140 04/19/2020 1458   K 3.8 09/27/2020 0805   CL 99 09/27/2020 0805   CO2 25 09/27/2020 0805   BUN 16 09/27/2020 0805   BUN 11 04/19/2020 1458   CREATININE 0.93 09/27/2020 0805      Component Value Date/Time   CALCIUM 9.1 09/27/2020 0805   ALKPHOS 139 (H) 09/27/2020 0805   AST 57 (H) 09/27/2020 0805   ALT 39 09/27/2020 0805   BILITOT 2.1 (H) 09/27/2020 0805       Impression and Plan: Darryl Velazquez is a very pleasant 60 yo caucasian gentleman with cirrhosis and hemochromatosis, heterozygous for the H63D mutations.  Iron studies are pending. We will adjust his phlebotomy plan and repeat lab work once we have results.  Follow-up in 3 months. He can contact our office with any questions or concerns.   Laverna Peace, NP 4/22/20228:57 AM

## 2020-10-04 ENCOUNTER — Telehealth: Payer: Self-pay | Admitting: *Deleted

## 2020-10-04 NOTE — Telephone Encounter (Signed)
Pt called regarding [facility] rx for phlebotomy. Faxed on 4/22, discussed with pt Rx will be refaxed to [phone]. No further concerns.

## 2020-10-07 DIAGNOSIS — K766 Portal hypertension: Secondary | ICD-10-CM | POA: Diagnosis not present

## 2020-10-07 DIAGNOSIS — K920 Hematemesis: Secondary | ICD-10-CM | POA: Diagnosis not present

## 2020-10-07 DIAGNOSIS — E7229 Other disorders of urea cycle metabolism: Secondary | ICD-10-CM | POA: Diagnosis not present

## 2020-10-07 DIAGNOSIS — Z8546 Personal history of malignant neoplasm of prostate: Secondary | ICD-10-CM | POA: Diagnosis not present

## 2020-10-07 DIAGNOSIS — K7460 Unspecified cirrhosis of liver: Secondary | ICD-10-CM | POA: Diagnosis not present

## 2020-10-07 DIAGNOSIS — R4182 Altered mental status, unspecified: Secondary | ICD-10-CM | POA: Diagnosis not present

## 2020-10-07 DIAGNOSIS — R531 Weakness: Secondary | ICD-10-CM | POA: Diagnosis not present

## 2020-10-07 DIAGNOSIS — Z20822 Contact with and (suspected) exposure to covid-19: Secondary | ICD-10-CM | POA: Diagnosis not present

## 2020-10-07 DIAGNOSIS — R519 Headache, unspecified: Secondary | ICD-10-CM | POA: Diagnosis not present

## 2020-10-07 DIAGNOSIS — Z7982 Long term (current) use of aspirin: Secondary | ICD-10-CM | POA: Diagnosis not present

## 2020-10-07 DIAGNOSIS — K449 Diaphragmatic hernia without obstruction or gangrene: Secondary | ICD-10-CM | POA: Diagnosis not present

## 2020-10-07 DIAGNOSIS — Z8673 Personal history of transient ischemic attack (TIA), and cerebral infarction without residual deficits: Secondary | ICD-10-CM | POA: Diagnosis not present

## 2020-10-07 DIAGNOSIS — K3189 Other diseases of stomach and duodenum: Secondary | ICD-10-CM | POA: Diagnosis not present

## 2020-10-07 DIAGNOSIS — K7290 Hepatic failure, unspecified without coma: Secondary | ICD-10-CM | POA: Diagnosis not present

## 2020-10-07 DIAGNOSIS — Z87891 Personal history of nicotine dependence: Secondary | ICD-10-CM | POA: Diagnosis not present

## 2020-10-07 DIAGNOSIS — G459 Transient cerebral ischemic attack, unspecified: Secondary | ICD-10-CM | POA: Diagnosis not present

## 2020-10-07 DIAGNOSIS — K7469 Other cirrhosis of liver: Secondary | ICD-10-CM | POA: Diagnosis not present

## 2020-10-07 DIAGNOSIS — K7581 Nonalcoholic steatohepatitis (NASH): Secondary | ICD-10-CM | POA: Diagnosis not present

## 2020-10-09 ENCOUNTER — Telehealth: Payer: Self-pay

## 2020-10-10 ENCOUNTER — Telehealth: Payer: Self-pay | Admitting: Gastroenterology

## 2020-10-10 DIAGNOSIS — K7460 Unspecified cirrhosis of liver: Secondary | ICD-10-CM

## 2020-10-10 DIAGNOSIS — E7220 Disorder of urea cycle metabolism, unspecified: Secondary | ICD-10-CM

## 2020-10-10 DIAGNOSIS — R4182 Altered mental status, unspecified: Secondary | ICD-10-CM

## 2020-10-10 NOTE — Telephone Encounter (Signed)
Spoke with patient's wife, she states that over the weekend she noticed that the patient was really tired, complained about headache and had some confusion. She reviewed the side of effects of Spironolactone and all of his symptoms were listed. She took him to [facility] ED and they told him that it seems like his symptoms are related to his cirrhosis and the use of spironolactone. Wife states that patient is still on Lasix 80 mg, and stopped spironolactone this morning because patient was still showing signs of confusion. Wife states that she did not notice these symptoms when he was on Spironolactone 50 mg. Records and labs are in care everywhere for review. Please advise, thanks.

## 2020-10-10 NOTE — Telephone Encounter (Signed)
Patients wife called said he is having a reaction to the Spironolactone medication.

## 2020-10-14 NOTE — Telephone Encounter (Signed)
Patient called to follow up on previous message. Seeking advise.

## 2020-10-15 MED ORDER — LACTULOSE 20 GM/30ML PO SOLN: 45.0000 mL | Freq: Two times a day (BID) | ORAL | 0 refills | Status: DC

## 2020-10-15 NOTE — Telephone Encounter (Signed)
Chart reviewed.  Patient was admitted 5/2-3 for altered mental status and reported 5 episodes of hematemesis.  Ammonia on admission was 67 and reported to have asterixis at urgent care.  No asterixis noted by admitting physician.  Given elevated ammonia and AMS, was started on lactulose with quick improvement in mental status.  Given the rapid improvement, was unclear that AMS was from CVA, so brain MRI performed and was negative for acute infarct.  Was started on ceftriaxone for SBP prophylaxis and octreotide.  EGD performed and showed portal hypertensive gastropathy but negative for varices or active bleeding in the octreotide/ceftriaxone were discontinued.  Concluded bleeding was from upper airway as he had also been experiencing frequent epistaxis.  At the time of discharge, notes indicate they thought AMS could have been from TIA and recommended close follow-up with PCP.  Increased rosuvastatin to 10 mg and continued ASA 81 mg continued on lactulose after discharge.  Admission labs reviewed and notable for the following: - Ammonia 67 - AST/ALT 52/38, ALP 131, T bili 3.9. - NA 134 otherwise normal BMP - WBC 10.9, H/H11.7/34.6 with MCV 84.6 - PT 18.5, INR 1.5  Based on this recent evaluation, I recommend the following: - Referral to Neurology for evaluation of possible recent TIA vs new hepatic encephalopathy - Continue lactulose as prescribed at the time of hospital discharge for goal of 2-4 soft stools per day - Repeat ammonia level now that he is on lactulose - Okay to reduce spironolactone to 50 mg for the time being and keep Lasix as previously prescribed - Repeat BMP in 1 week due to diuretic changes - Schedule follow-up appoint with me

## 2020-10-15 NOTE — Telephone Encounter (Signed)
Spoke with patient's wife in regards to recommendations. Wife states that patient no longer has lactulose, she states that he was not sent home with any. Per note - patient was to continue Lactulose 30 g BID with the goal of 2-3 stools a day - will refill, wife requested refill be sent to [facility]. Wife is aware that patient will need to come in for a recheck for his Ammonia levels this week at his convenience and then he will repeat labs next week due to diuretic changes. Wife is aware that neurology will contact them directly to set up an appt. Patient has been scheduled for a follow up with Dr. Bryan Lemma on Thursday, 11/14/20 at 9 AM. Wife verbalized understanding of all information and had no concerns at the end of the call.  Lab order for ammonia and reminder for BMET in 1 week in epic.  Ambulatory referral to Neurology in epic.

## 2020-10-21 ENCOUNTER — Encounter: Payer: Self-pay | Admitting: Neurology

## 2020-10-22 ENCOUNTER — Telehealth: Payer: Self-pay

## 2020-10-22 DIAGNOSIS — K7460 Unspecified cirrhosis of liver: Secondary | ICD-10-CM

## 2020-10-22 DIAGNOSIS — E7220 Disorder of urea cycle metabolism, unspecified: Secondary | ICD-10-CM

## 2020-10-22 DIAGNOSIS — R4182 Altered mental status, unspecified: Secondary | ICD-10-CM

## 2020-10-22 NOTE — Telephone Encounter (Signed)
Spoke with patient in regards to lab reminder. He verbalized understanding and had no concerns at the end of the call.

## 2020-10-22 NOTE — Telephone Encounter (Signed)
-----   Message from Yevette Edwards, [place] sent at 10/15/2020 11:37 AM EDT ----- Regarding: Labs BMET, will need to place an order.

## 2020-10-22 NOTE — Telephone Encounter (Signed)
Spoke with patient's wife to remind her that patient is due for repeat labs at this time. No appointment is necessary. Patient's wife is aware that he can stop by the lab in the basement at his convenience between 7:30 AM - 5 PM, Monday through Friday. Patient's wife verbalized understanding and had no concerns at the end of the call.   Lab order in epic.

## 2020-10-25 ENCOUNTER — Other Ambulatory Visit: Payer: Self-pay

## 2020-10-25 ENCOUNTER — Other Ambulatory Visit (INDEPENDENT_AMBULATORY_CARE_PROVIDER_SITE_OTHER): Payer: BC Managed Care – PPO

## 2020-10-25 DIAGNOSIS — K7460 Unspecified cirrhosis of liver: Secondary | ICD-10-CM | POA: Diagnosis not present

## 2020-10-25 DIAGNOSIS — R4182 Altered mental status, unspecified: Secondary | ICD-10-CM | POA: Diagnosis not present

## 2020-10-25 DIAGNOSIS — E7220 Disorder of urea cycle metabolism, unspecified: Secondary | ICD-10-CM

## 2020-10-25 LAB — BASIC METABOLIC PANEL
BUN: 12 mg/dL (ref 6–23)
CO2: 28 mEq/L (ref 19–32)
Calcium: 9.4 mg/dL (ref 8.4–10.5)
Chloride: 94 mEq/L — ABNORMAL LOW (ref 96–112)
Creatinine, Ser: 0.96 mg/dL (ref 0.40–1.50)
GFR: 86.42 mL/min (ref >60.00)
Glucose, Bld: 132 mg/dL — ABNORMAL HIGH (ref 70–99)
Potassium: 3.3 mEq/L — ABNORMAL LOW (ref 3.5–5.1)
Sodium: 132 mEq/L — ABNORMAL LOW (ref 135–145)

## 2020-10-25 LAB — AMMONIA: Ammonia: 89 umol/L — ABNORMAL HIGH (ref 11–35)

## 2020-10-25 NOTE — Addendum Note (Signed)
Addended by: Manuela Schwartz on: 10/25/2020 08:36 AM   Modules accepted: Orders

## 2020-10-28 ENCOUNTER — Inpatient Hospital Stay: Payer: BC Managed Care – PPO

## 2020-11-05 ENCOUNTER — Other Ambulatory Visit: Payer: Self-pay | Admitting: Gastroenterology

## 2020-11-06 ENCOUNTER — Other Ambulatory Visit: Payer: Self-pay

## 2020-11-06 ENCOUNTER — Encounter: Payer: Self-pay | Admitting: Sports Medicine

## 2020-11-06 ENCOUNTER — Telehealth: Payer: Self-pay

## 2020-11-06 ENCOUNTER — Ambulatory Visit (INDEPENDENT_AMBULATORY_CARE_PROVIDER_SITE_OTHER): Payer: BC Managed Care – PPO

## 2020-11-06 ENCOUNTER — Other Ambulatory Visit: Payer: Self-pay | Admitting: Sports Medicine

## 2020-11-06 ENCOUNTER — Ambulatory Visit: Payer: BC Managed Care – PPO | Admitting: Sports Medicine

## 2020-11-06 DIAGNOSIS — I739 Peripheral vascular disease, unspecified: Secondary | ICD-10-CM

## 2020-11-06 DIAGNOSIS — M2040 Other hammer toe(s) (acquired), unspecified foot: Secondary | ICD-10-CM

## 2020-11-06 DIAGNOSIS — M21621 Bunionette of right foot: Secondary | ICD-10-CM

## 2020-11-06 DIAGNOSIS — M79671 Pain in right foot: Secondary | ICD-10-CM

## 2020-11-06 DIAGNOSIS — M79672 Pain in left foot: Secondary | ICD-10-CM

## 2020-11-06 NOTE — Progress Notes (Signed)
Subjective: Darryl Velazquez is a 60 y.o. male patient returns office for follow-up evaluation of bilateral foot pain reports that his pain has flared back up over the last 6 to 7 months to 9/10 sharp in nature over the baby toe joint reports that is worse with pressure has tried padding orthotics taking Lasix medication which helps with swelling but has doubled up on his socks due to pain with pressure at the big toe joint that is excruciating.  Patient reports that he wants to discuss further possibility of surgery states that he feels a follow-up last year due to the fact that he was diagnosed with liver cirrhosis and had a lot of health issues going on at the time.  Patient denies any other pedal complaints at this time or new medical problems.  Patient Active Problem List   Diagnosis Date Noted  . Cerebrovascular disease   . Chronic venous insufficiency   . Dyslipidemia   . Hemochromatosis, hereditary ([place])   . Medication intolerance   . Morbid obesity ([place])   . Nicotine dependence   . Nonalcoholic steatohepatitis   . Peripheral vascular disease ([place])   . Pneumothorax, traumatic   . Prostate cancer ([place])   . 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 Sleep apnea   . Venous stasis dermatitis of both lower extremities   . Hyperestrogenism 09/01/2019  . Erectile dysfunction after radical prostatectomy 08/19/2018  . Hypercoagulable state ([place]) 08/19/2018  . NAFLD (nonalcoholic fatty liver disease) 08/19/2018  . Pelvic floor dysfunction 08/19/2018  . Personal history of prostate cancer 08/19/2018  . Tobacco use disorder 08/19/2018  . Elevated liver enzymes 03/27/2016  . OSA on CPAP 03/27/2016  . Class 2 severe obesity due to excess calories with serious comorbidity and body mass index (BMI) of 39.0 to 39.9 in adult ([place]) 03/11/2016  . Arthritis 03/10/2016  . Cancer of prostate with high recurrence risk (stage T3a or Gleason 8-10 or PSA > 20) 03/10/2016  . Elevated ferritin 03/10/2016  . Stroke  [facility]) 03/10/2016    Current Outpatient Medications on File Prior to Visit  Medication Sig Dispense Refill  . ALPRAZolam (XANAX) 0.25 MG tablet Take 0.25 mg by mouth 2 (two) times daily as needed.    Marland Kitchen aspirin EC 81 MG tablet Take 81 mg by mouth daily. Swallow whole.    . furosemide (LASIX) 80 MG tablet TAKE 1 TABLET BY MOUTH EVERY OTHER DAY 30 tablet 1  . Lactulose 20 GM/30ML SOLN Take 45 mLs (30 g total) by mouth in the morning and at bedtime. 1892 mL 0  . spironolactone (ALDACTONE) 100 MG tablet Take 1 tablet (100 mg total) by mouth daily. 30 tablet 1   Current Facility-Administered Medications on File Prior to Visit  Medication Dose Route Frequency Provider Last Rate Last Admin  . triamcinolone acetonide (KENALOG) 10 MG/ML injection 10 mg  10 mg Other Once Landis Martins, DPM        No Known Allergies  Objective: Physical Exam General: The patient is alert and oriented x3 in no acute distress.  Dermatology: Skin is warm, dry and supple bilateral lower extremities. Nails 1-10 are normal. There is no erythema, edema, no eccymosis, no open lesions present. Integument is otherwise unremarkable.  Vascular: Dorsalis Pedis pulse 1/4and Posterior Tibial pulse are 0/4 bilateral. Capillary fill time is immediate to all digits.  1+ pitting edema bilateral worse at ankles.  Neurological: Grossly intact to light touch bilateral.  Musculoskeletal:  There is pain to palpation to fifth  tarsal phalangeal joints bilateral with the right fifth toe joint being most symptomatic.  There is medial deviation of the right fifth toe consistent with hammertoe deformity.  There is prominent fifth metatarsal range of motion is guarded at this fifth toe joint to pain on the right.  Pes planus foot type bilateral.  X-rays right foot: There is a large fifth metatarsal head consistent with history of bunionette deformity and varus rotation of the fifth toe.  There is a small sesamoid noted at the fifth MPJ and  significant soft tissue swelling.  There is plantarflexed position of the fifth metatarsal noted on the lateral view.    Assessment and Plan: Problem List Items Addressed This Visit   None   Visit Diagnoses    Tailor's bunionette, right    -  Primary   Hammer toe, unspecified laterality       PVD (peripheral vascular disease) ([place])       Bilateral foot pain          -Complete examination performed.  -X-rays reviewed -Discussed with patient treatment options attempted bunion on right -Patient declines steroid injection at this time -Patient opt for surgical management. Consent obtained for removal of fifth metatarsal head and of right fifth toe for hammertoe repair Pre and Post op course explained. Risks, benefits, alternatives explained. No guarantees given or implied. Surgical booking slip submitted and provided patient with Surgical packet and info for [facility]. -To dispense surgical shoe at surgical  -Advised patient of restrictions of limited weightbearing immediate postop -Encourage patient to continue with rest elevation and fluid medication for edema -Patient to follow-up with his liver/GI doctor next week -Patient to return to office after surgery for follow up or sooner if problems or questions arise.  Landis Martins, DPM

## 2020-11-06 NOTE — Telephone Encounter (Signed)
Pt called to r/s his 6/3 lab to 6/9   Darryl Velazquez

## 2020-11-07 ENCOUNTER — Other Ambulatory Visit: Payer: Self-pay | Admitting: Sports Medicine

## 2020-11-07 DIAGNOSIS — M21621 Bunionette of right foot: Secondary | ICD-10-CM

## 2020-11-08 ENCOUNTER — Inpatient Hospital Stay: Payer: BC Managed Care – PPO

## 2020-11-12 ENCOUNTER — Telehealth: Payer: Self-pay | Admitting: Urology

## 2020-11-12 NOTE — Telephone Encounter (Signed)
DOS - 12/02/20  MET HEAD RESC 5TH RIGHT --- 74600 HAMMERTOE REPAIR 5TH RIGHT --- 29847   BCBS EFFECTIVE DATE - 10/06/20   PLAN DEDUCTIBLE - $1,000.00 W/ $1,000.00 REMAINING OUT OF POCKET - $5,000.00 W/ $5,000.00 REMAINING  COINSURANCE - 20% COPAY - $0.00   NO PRIOR AUTH REQUIRED

## 2020-11-14 ENCOUNTER — Encounter: Payer: Self-pay | Admitting: Gastroenterology

## 2020-11-14 ENCOUNTER — Inpatient Hospital Stay: Payer: BC Managed Care – PPO | Attending: Family

## 2020-11-14 ENCOUNTER — Other Ambulatory Visit (INDEPENDENT_AMBULATORY_CARE_PROVIDER_SITE_OTHER): Payer: BC Managed Care – PPO

## 2020-11-14 ENCOUNTER — Other Ambulatory Visit: Payer: Self-pay

## 2020-11-14 ENCOUNTER — Ambulatory Visit (INDEPENDENT_AMBULATORY_CARE_PROVIDER_SITE_OTHER): Payer: BC Managed Care – PPO | Admitting: Gastroenterology

## 2020-11-14 VITALS — BP 130/60 | HR 62 | Ht 76.0 in | Wt 293.1 lb

## 2020-11-14 DIAGNOSIS — K7290 Hepatic failure, unspecified without coma: Secondary | ICD-10-CM

## 2020-11-14 DIAGNOSIS — K3189 Other diseases of stomach and duodenum: Secondary | ICD-10-CM

## 2020-11-14 DIAGNOSIS — R600 Localized edema: Secondary | ICD-10-CM | POA: Diagnosis not present

## 2020-11-14 DIAGNOSIS — K7460 Unspecified cirrhosis of liver: Secondary | ICD-10-CM | POA: Diagnosis not present

## 2020-11-14 DIAGNOSIS — K7581 Nonalcoholic steatohepatitis (NASH): Secondary | ICD-10-CM

## 2020-11-14 DIAGNOSIS — K7682 Hepatic encephalopathy: Secondary | ICD-10-CM

## 2020-11-14 DIAGNOSIS — K7469 Other cirrhosis of liver: Secondary | ICD-10-CM | POA: Diagnosis not present

## 2020-11-14 DIAGNOSIS — K766 Portal hypertension: Secondary | ICD-10-CM

## 2020-11-14 LAB — CBC WITH DIFFERENTIAL (CANCER CENTER ONLY)
Abs Immature Granulocytes: 0.01 10*3/uL (ref 0.00–0.07)
Basophils Absolute: 0.1 10*3/uL (ref 0.0–0.1)
Basophils Relative: 2 %
Eosinophils Absolute: 0.3 10*3/uL (ref 0.0–0.5)
Eosinophils Relative: 7 %
HCT: 32.2 % — ABNORMAL LOW (ref 39.0–52.0)
Hemoglobin: 11.1 g/dL — ABNORMAL LOW (ref 13.0–17.0)
Immature Granulocytes: 0 %
Lymphocytes Relative: 25 %
Lymphs Abs: 1 10*3/uL (ref 0.7–4.0)
MCH: 28.5 pg (ref 26.0–34.0)
MCHC: 34.5 g/dL (ref 30.0–36.0)
MCV: 82.8 fL (ref 80.0–100.0)
Monocytes Absolute: 0.9 10*3/uL (ref 0.1–1.0)
Monocytes Relative: 21 %
Neutro Abs: 1.9 10*3/uL (ref 1.7–7.7)
Neutrophils Relative %: 45 %
Platelet Count: 166 10*3/uL (ref 150–400)
RBC: 3.89 MIL/uL — ABNORMAL LOW (ref 4.22–5.81)
RDW: 17.4 % — ABNORMAL HIGH (ref 11.5–15.5)
WBC Count: 4.1 10*3/uL (ref 4.0–10.5)
nRBC: 0 % (ref 0.0–0.2)

## 2020-11-14 LAB — CMP (CANCER CENTER ONLY)
ALT: 32 U/L (ref 0–44)
AST: 54 U/L — ABNORMAL HIGH (ref 15–41)
Albumin: 2.9 g/dL — ABNORMAL LOW (ref 3.5–5.0)
Alkaline Phosphatase: 128 U/L — ABNORMAL HIGH (ref 38–126)
Anion gap: 5 (ref 5–15)
BUN: 15 mg/dL (ref 6–20)
CO2: 29 mmol/L (ref 22–32)
Calcium: 9 mg/dL (ref 8.9–10.3)
Chloride: 100 mmol/L (ref 98–111)
Creatinine: 0.96 mg/dL (ref 0.61–1.24)
GFR, Estimated: >60 mL/min (ref >60)
Glucose, Bld: 172 mg/dL — ABNORMAL HIGH (ref 70–99)
Potassium: 3.9 mmol/L (ref 3.5–5.1)
Sodium: 134 mmol/L — ABNORMAL LOW (ref 135–145)
Total Bilirubin: 2.7 mg/dL — ABNORMAL HIGH (ref 0.3–1.2)
Total Protein: 6.4 g/dL — ABNORMAL LOW (ref 6.5–8.1)

## 2020-11-14 LAB — IRON AND TIBC
Iron: 271 ug/dL — ABNORMAL HIGH (ref 42–163)
Saturation Ratios: 102 % — ABNORMAL HIGH (ref 20–55)
TIBC: 265 ug/dL (ref 202–409)
UIBC: UNDETERMINED ug/dL (ref 117–376)

## 2020-11-14 LAB — FERRITIN: Ferritin: 282 ng/mL (ref 24–336)

## 2020-11-14 LAB — PROTIME-INR
INR: 1.4 ratio — ABNORMAL HIGH (ref 0.8–1.0)
Prothrombin Time: 15.7 s — ABNORMAL HIGH (ref 9.6–13.1)

## 2020-11-14 NOTE — Patient Instructions (Signed)
If you are age 60 or older, your body mass index should be between 23-30. Your Body mass index is 35.68 kg/m. If this is out of the aforementioned range listed, please consider follow up with your Primary Care Provider.  If you are age 75 or younger, your body mass index should be between 19-25. Your Body mass index is 35.68 kg/m. If this is out of the aformentioned range listed, please consider follow up with your Primary Care Provider.   We are completing your FMLA/disability paperwork and will fax it to your company with in the next several days. A copy will be mailed to your residence.  Due to recent changes in healthcare laws, you may see the results of your imaging and laboratory studies on MyChart before your provider has had a chance to review them.  We understand that in some cases there may be results that are confusing or concerning to you. Not all laboratory results come back in the same time frame and the provider may be waiting for multiple results in order to interpret others.  Please give [place] 48 hours in order for your provider to thoroughly review all the results before contacting the office for clarification of your results.   Please return to the clinic in 6 months or before if needed. You will need to call [phone] to schedule.  Thank you for choosing me and [facility].  Vito Cirigliano, D.O.

## 2020-11-14 NOTE — Progress Notes (Signed)
Chief Complaint:    Cirrhosis follow-up, lower extremity edema, disability paperwork  GI History: 60 year old male with a history of prostate cancer (surgery only), history of CVA (1st at age 62), obesity (BMI 33),  hereditary hemochromatosis, dyslipidemia, peripheral vascular disease, sleep apnea.     -Appears he has had mildly elevated liver enzymes since 03/2016 (AST/ALT 56/67) with normal T bili 0.6 at that time. -04/2020: Evaluated by Dr. [name] in Cardiology clinic for new onset LE edema.  TTE unremarkable.  Started on diuretics. -Follows in the Hematology clinic for hemochromatosis with heterozygous H63D mutation.  Started weekly phlebotomy in 06/2020.   Cirrhosis Evaluation: - Etiology: NASH.  Questionable contribution from hemochromatosis - Complications: Radiographic ascites, portal hypertensive gastropathy, lower extremity edema, hepatic encephalopathy - HCC screening: UTD.  Elevated AFP, but MRI/MRCP 05/2020 without hepatoma.  Will need repeat ordered - Variceal screening: EGD 05/2020.  No EV - Serologic evaluation: Completed 05/2020. Hemochromatosis DNA positive for 1 H63D (carrier of hemochromatosis). -Elevated IgA (856), elevated IgG (2209), elevated/positive antimitochondrial antibody (43.2), and elevated AFP (12.7). Ferritin 419 (previously 968), iron saturation at 62.5% . Normal/negative ANA, ASMA, ceruloplasmin, viral hepatitis panel, IgM, HIV - Viral hepatitis vaccination: Negative HBsAb, HAVAb.  Giving vaccine series today - Flu vaccine: UTD - Liver biopsy: 05/16/2020: Chronic minimally active hepatitis with prominent bridging septa and focal nodularity suggestive of cirrhosis (grade 1, stage IV).  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  Plan for continued observation every 6 months.   Imaging: -Abdominal ultrasound (09/2019): Hepatomegaly with cirrhotic liver morphology.  No suspicious hepatic lesions.  -CT angio (12/16/2018): Mild distention of GB, no focal liver abnormality but concern for nodular liver contour.  Normal pancreas, normal GI tract.  Mildly prominent LN near gastrohepatic ligament similar to previous study.  Mild large amount of lymph nodes in the lower pelvis and inguinal region. -RUQ [place] (04/24/2020): Markedly heterogenous liver with appearance compatible with cirrhosis, hypoechoic area, potentially a vascular structure measuring 12 mm.  Small volume ascites.  Recommend CT or MRI. -MRI liver (05/2020): Coarse, nodular liver consistent with cirrhosis without other features of portal hypertension.  Trace perihepatic ascites.  No liver mass     Endoscopic history: -Colonoscopy x2, last being ~3 years ago in [place] and n/f polyps, with plan to repeat 5 years.  No records for review.   -EGD (05/2020): Normal esophagus, moderate portal hypertensive gastritis, moderate duodenitis.  Referred to ENT for polypoid lesion of base of tongue.  Started high-dose PPI - EGD (10/2020, inpatient at [facility]): No esophageal varices, portal hypertensive gastropathy   FHx: - Father with stomach CA in his 12's  HPI:     Patient is a 60 y.o. male presenting to the Gastroenterology Clinic for follow-up.  Last seen by me on 08/27/2020.  Increased Lasix to 80 mg/day and was seen in the Nephrology clinic- no change in diuretic plan.  Unfortunately, due to continued LE edema, hepatic encephalopathy, and medication side effect, he is no longer able to work.  He is requesting disability paperwork completed today.  He otherwise continues low-sodium diet and diuretics as prescribed.  Has f/u with [facility]  next  month.   He was readmitted 5/2-3 with AMS/hyperammonemia.  Lactulose restarted with rapid improvement.  MRI brain without acute infarct and working diagnosis was possible TIA.  He has pending appointment with Neurology in July.  Inpatient EGD performed and no esophageal varices.  Labs performed earlier today with stable H/H at 11/32, Na 134, normal BUN/creatinine, AST/ALT stable at 54/32 and T bili 2.7.  Iron panel pending.  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   Venous stasis dermatitis of both lower extremities     Patient's surgical history, family medical history, social history, medications and allergies were all reviewed in Epic    Current Outpatient Medications  Medication Sig Dispense Refill   ALPRAZolam (XANAX) 0.25 MG tablet Take 0.25 mg by mouth 2 (two) times daily as needed.     aspirin EC 81 MG tablet Take 81 mg by mouth daily. Swallow whole.     furosemide (LASIX) 80 MG tablet TAKE 1 TABLET BY MOUTH EVERY OTHER DAY 30 tablet 1   Lactulose 20 GM/30ML SOLN Take 45 mLs (30 g total) by mouth in the morning and at bedtime. 1892 mL 0   spironolactone (ALDACTONE) 100 MG tablet Take 1 tablet (100 mg total) by mouth daily. 30 tablet 1   Current Facility-Administered Medications  Medication Dose Route Frequency Provider Last Rate Last Admin   triamcinolone acetonide (KENALOG) 10 MG/ML injection 10 mg  10 mg Other Once Landis Martins, DPM        Physical Exam:     BP 130/60   Pulse 62   Ht 6' 4"  (1.93 m)    Wt 293 lb 2 oz (133 kg)   SpO2 95%   BMI 35.68 kg/m   GENERAL:  Pleasant male in NAD PSYCH: : Cooperative, normal affect Musculoskeletal:  Normal muscle tone, normal strength NEURO: Alert and oriented x 3, no focal neurologic deficits   IMPRESSION and PLAN:    1) NASH Cirrhosis 2) Lower extremity edema 3) Heterozygous carrier for hemochromatosis/Iron overload 4) Portal hypertensive gastropathy 5) Radiographic ascites  - Add on INR to today's labs to calculate MELD - Keep follow-up appointment with [facility] scheduled for next month - Continue Lasix 80 mg/day and Aldactone 100 mg/day - Continue lactulose - Continue low-sodium diet - Continue follow-up in the Hematology clinic with phlebotomy for Hematology - Due to elevated AFP, recommend repeat MRI liver in 6 months (11/2020) for ongoing [place] screening.  If again normal, okay to return to ultrasound 6 months later. - Repeat EGD in 2023 for ongoing surveillance -Will complete disability paperwork as he is no longer able to work due to LE edema, hepatic encephalopathy, and known medication side effects  RTC in 6 months or sooner as needed  I spent 45 minutes of time, including in depth chart review, independent review of results as outlined above, communicating results with the patient directly, face-to-face time with  the patient, coordinating care, ordering studies and medications as appropriate, and documentation.            Lavena Bullion ,DO, FACG 11/14/2020, 9:37 AM

## 2020-11-15 ENCOUNTER — Ambulatory Visit: Payer: Self-pay | Admitting: Sports Medicine

## 2020-11-15 ENCOUNTER — Other Ambulatory Visit: Payer: Self-pay | Admitting: General Surgery

## 2020-11-15 ENCOUNTER — Telehealth: Payer: Self-pay | Admitting: General Surgery

## 2020-11-15 DIAGNOSIS — K766 Portal hypertension: Secondary | ICD-10-CM

## 2020-11-15 DIAGNOSIS — K760 Fatty (change of) liver, not elsewhere classified: Secondary | ICD-10-CM

## 2020-11-15 DIAGNOSIS — K7581 Nonalcoholic steatohepatitis (NASH): Secondary | ICD-10-CM

## 2020-11-15 DIAGNOSIS — K3189 Other diseases of stomach and duodenum: Secondary | ICD-10-CM

## 2020-11-15 DIAGNOSIS — K7460 Unspecified cirrhosis of liver: Secondary | ICD-10-CM

## 2020-11-15 DIAGNOSIS — K7290 Hepatic failure, unspecified without coma: Secondary | ICD-10-CM

## 2020-11-15 DIAGNOSIS — K7682 Hepatic encephalopathy: Secondary | ICD-10-CM

## 2020-11-15 NOTE — Telephone Encounter (Signed)
-----   Message from [place], DO sent at 11/14/2020  1:05 PM EDT ----- I forgot to tell him I wanted to order a repeat MRI Liver for contineud [place] screening. Can we call him and coordinate to be done sometime before his Hepatology appt next month? Thanks

## 2020-11-15 NOTE — Telephone Encounter (Signed)
Spoke with Darryl Velazquez at [facility], patient may schedule his own MRI once it is authorized. Sent a mychart message to the patient regarding this.

## 2020-11-15 NOTE — Telephone Encounter (Signed)
-----   Message from [place], DO sent at 11/14/2020  1:05 PM EDT ----- I forgot to tell him I wanted to order a repeat MRI Liver for contineud [place] screening. Can we call him and coordinate to be done sometime before his Hepatology appt next month? Thanks

## 2020-11-15 NOTE — Telephone Encounter (Signed)
-----   Message from [place], DO sent at 11/14/2020  1:05 PM EDT ----- I forgot to tell him I wanted to order a repeat MRI Liver for contineud [place] screening. Can we call him and coordinate to be done sometime before his Hepatology appt next month? Thanks

## 2020-11-23 ENCOUNTER — Other Ambulatory Visit: Payer: Self-pay

## 2020-11-23 ENCOUNTER — Ambulatory Visit (HOSPITAL_BASED_OUTPATIENT_CLINIC_OR_DEPARTMENT_OTHER)
Admission: RE | Admit: 2020-11-23 | Discharge: 2020-11-23 | Disposition: A | Discharge status: Home / Self Care | Payer: BC Managed Care – PPO | Source: Ambulatory Visit | Attending: Gastroenterology | Admitting: Gastroenterology

## 2020-11-23 DIAGNOSIS — K828 Other specified diseases of gallbladder: Secondary | ICD-10-CM | POA: Diagnosis not present

## 2020-11-23 DIAGNOSIS — K760 Fatty (change of) liver, not elsewhere classified: Secondary | ICD-10-CM | POA: Insufficient documentation

## 2020-11-23 DIAGNOSIS — Z8546 Personal history of malignant neoplasm of prostate: Secondary | ICD-10-CM | POA: Diagnosis not present

## 2020-11-23 DIAGNOSIS — R188 Other ascites: Secondary | ICD-10-CM | POA: Diagnosis not present

## 2020-11-23 DIAGNOSIS — K7469 Other cirrhosis of liver: Secondary | ICD-10-CM | POA: Diagnosis not present

## 2020-11-23 IMAGING — MR MR ABDOMEN WO/W CM MRCP
9 of 19 series · 19 of 48 positions shown · IV contrast (gadavist)
Comparison: Abdominal MRI [DATE].  Ultrasound [DATE]

CLINICAL DATA: Follow up cirrhosis.  History of prostate cancer.

EXAM:
MRI ABDOMEN WITHOUT AND WITH CONTRAST (INCLUDING MRCP)
TECHNIQUE: Multiplanar multisequence MR imaging of the abdomen was performed
both before and after the administration of intravenous contrast.
Heavily T2-weighted images of the biliary and pancreatic ducts were
obtained, and three-dimensional MRCP images were rendered by post
processing.
CONTRAST:  10mL GADAVIST GADOBUTROL 1 MMOL/ML IV SOLN

[Series 3: T2 fat-sat · axial · 6.0mm · 1.64mm/px · 1 of 30 slices shown]
[im 1/30]
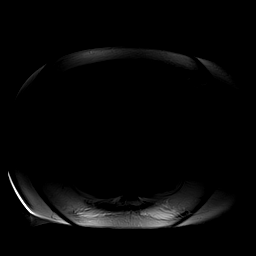

[Series 4: T2 · coronal · 8.0mm · 1.72mm/px · 1 of 33 slices shown (1 of 2)]
[im 1/33]
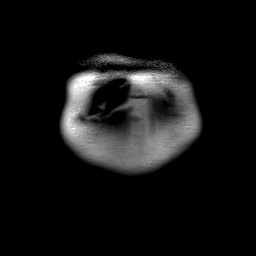

[Series 5: acr-in + out · axial · 7.0mm · 0.82mm/px · z∈[-149,+122]mm · 3 of 64 slices shown]
[im 1/64]
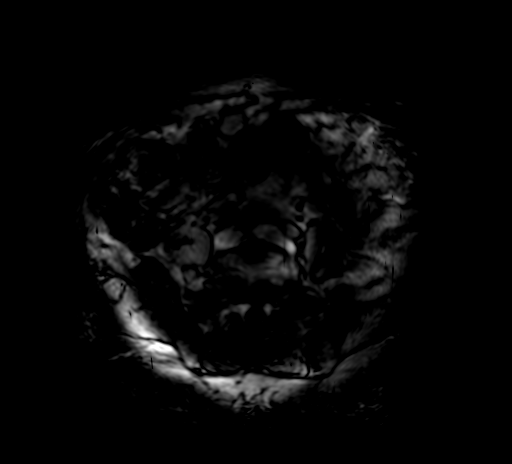
[im 32/64]
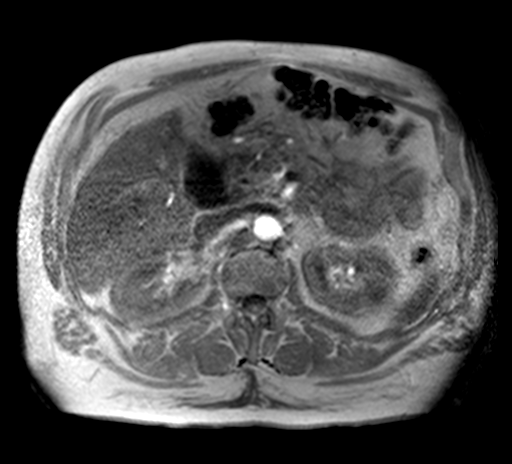
[im 64/64]
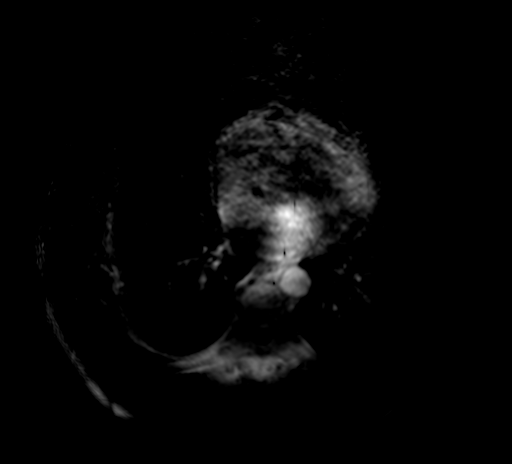

[Series 6: ep2d_diff_b50_500_800 free breathing · axial · 6.0mm · 2.29mm/px · z∈[-117,+160]mm · 5 of 114 slices shown]
[im 1/114]
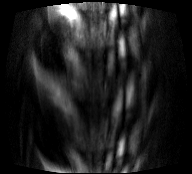
[im 29/114]
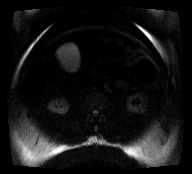
[im 57/114]
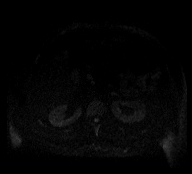
[im 85/114]
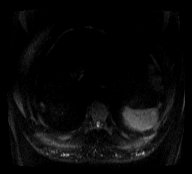
[im 114/114]
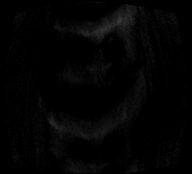

[Series 7: ep2d_diff_b50_500_800 free breathing_adc · axial · 6.0mm · 2.29mm/px · z∈[-117,+160]mm · 2 of 38 slices shown]
[im 1/38]
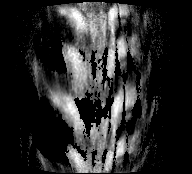
[im 38/38]
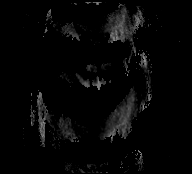

[Series 8: T2 · axial · 6.0mm · 1.64mm/px · z∈[-151,+119]mm · 2 of 37 slices shown (2 of 2)]
[im 1/37]
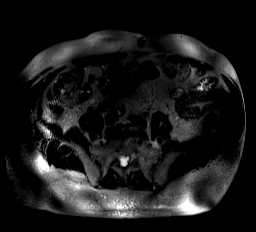
[im 37/37]
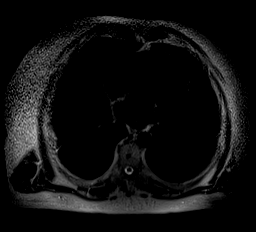

[Series 13: MRCP · coronal · 3.0mm · 0.78mm/px · 1 of 9 slices shown]
[im 1/9]
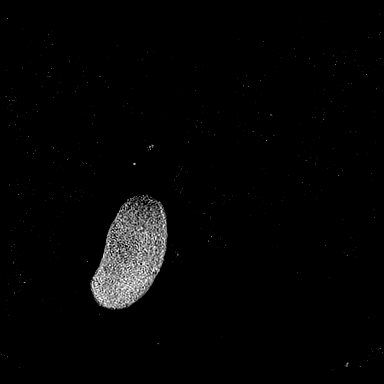

[Series 5002: sub_s23-s14_1 · axial · 4.4mm · 0.82mm/px · z∈[-174,+138]mm · 3 of 72 slices shown]
[im 1/72]
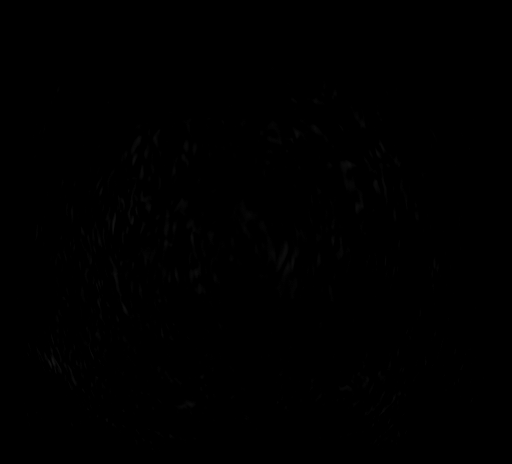
[im 36/72]
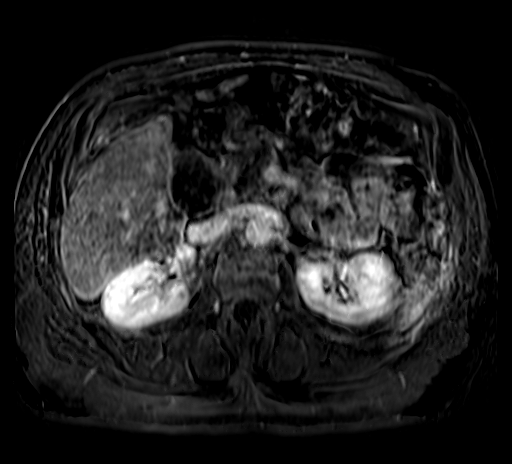
[im 72/72]
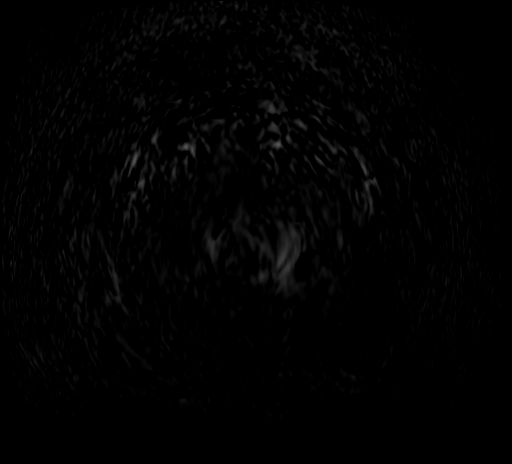

[Series 5003: note · axial · 4.4mm · 0.82mm/px · 1 of 1 slices shown]
[im 1/1]
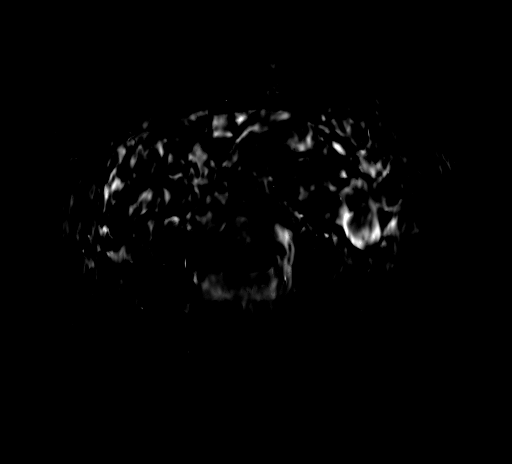

[19 of 48 positions shown; findings below may reference images not displayed]

FINDINGS: Despite efforts by the technologist and patient, moderate motion
artifact is present on today's exam and could not be eliminated.
This reduces exam sensitivity and specificity. Motion is greatest on
the postcontrast images.

Lower chest:  The visualized lower chest appears unremarkable.

Hepatobiliary: Mild contour irregularity within the left hepatic
lobe consistent with cirrhosis. No suspicious focal lesion or
abnormal enhancement identified. The gallbladder is distended
without wall thickening or cholelithiasis. No biliary dilatation.

Pancreas: Unremarkable. No pancreatic ductal dilatation or
surrounding inflammatory changes.

Spleen: At the upper limits of normal in size without focal
abnormality or abnormal enhancement.

Adrenals/Urinary Tract: Both adrenal glands appear normal. No
evidence of renal mass or hydronephrosis.

Stomach/Bowel: The stomach appears unremarkable for its degree of
distension. No evidence of bowel wall thickening, distention or
surrounding inflammatory change.

Vascular/Lymphatic: There are no enlarged abdominal lymph nodes. No
acute vascular findings. Stable low signal projecting over the
central portion of the right portal vein, possibly artifactual.

Other: Trace ascites.

Musculoskeletal: No acute or significant osseous findings.
IMPRESSION: 1. Motion degraded study limits assessment for subtle abnormalities.
No focal liver lesion or abnormal enhancement identified.
2. Stable morphologic changes of cirrhosis with trace perihepatic
ascites. No specific signs of portal hypertension.
3. If additional follow-up is warranted clinically, consider
alternative modality such as CT given motion limitations of MRI for
this patient.

## 2020-11-23 MED ORDER — GADOBUTROL 1 MMOL/ML IV SOLN
10.0000 mL | Freq: Once | INTRAVENOUS | Status: AC | PRN
  Administered 2020-11-23: 10 mL via INTRAVENOUS

## 2020-12-01 ENCOUNTER — Other Ambulatory Visit: Payer: Self-pay | Admitting: Sports Medicine

## 2020-12-01 NOTE — Progress Notes (Addendum)
Post op meds entered Changed Rx to [facility]

## 2020-12-02 ENCOUNTER — Encounter: Payer: Self-pay | Admitting: Sports Medicine

## 2020-12-02 DIAGNOSIS — M7741 Metatarsalgia, right foot: Secondary | ICD-10-CM | POA: Diagnosis not present

## 2020-12-02 DIAGNOSIS — M21621 Bunionette of right foot: Secondary | ICD-10-CM | POA: Diagnosis not present

## 2020-12-02 DIAGNOSIS — M21541 Acquired clubfoot, right foot: Secondary | ICD-10-CM

## 2020-12-02 DIAGNOSIS — M2041 Other hammer toe(s) (acquired), right foot: Secondary | ICD-10-CM | POA: Diagnosis not present

## 2020-12-02 MED ORDER — HYDROCODONE-ACETAMINOPHEN 10-325 MG PO TABS: 1.0000 | ORAL_TABLET | Freq: Four times a day (QID) | ORAL | 0 refills | Status: AC | PRN

## 2020-12-02 MED ORDER — HYDROCODONE-ACETAMINOPHEN 10-325 MG PO TABS: 1.0000 | ORAL_TABLET | Freq: Four times a day (QID) | ORAL | 0 refills | Status: DC | PRN

## 2020-12-02 MED ORDER — DOCUSATE SODIUM 100 MG PO CAPS: 100.0000 mg | ORAL_CAPSULE | Freq: Two times a day (BID) | ORAL | 0 refills | Status: DC

## 2020-12-02 MED ORDER — PROMETHAZINE HCL 25 MG PO TABS: 25.0000 mg | ORAL_TABLET | Freq: Three times a day (TID) | ORAL | 0 refills | Status: DC | PRN

## 2020-12-02 MED ORDER — IBUPROFEN 800 MG PO TABS: 800.0000 mg | ORAL_TABLET | Freq: Three times a day (TID) | ORAL | 0 refills | Status: DC | PRN

## 2020-12-02 NOTE — Addendum Note (Signed)
Addended by: Landis Martins T on: 12/02/2020 08:20 AM   Modules accepted: Orders

## 2020-12-12 ENCOUNTER — Encounter: Payer: Self-pay | Admitting: Podiatry

## 2020-12-12 ENCOUNTER — Other Ambulatory Visit: Payer: Self-pay

## 2020-12-12 ENCOUNTER — Ambulatory Visit (INDEPENDENT_AMBULATORY_CARE_PROVIDER_SITE_OTHER): Payer: BC Managed Care – PPO | Admitting: Podiatry

## 2020-12-12 ENCOUNTER — Ambulatory Visit (INDEPENDENT_AMBULATORY_CARE_PROVIDER_SITE_OTHER): Payer: BC Managed Care – PPO

## 2020-12-12 DIAGNOSIS — M21621 Bunionette of right foot: Secondary | ICD-10-CM

## 2020-12-12 DIAGNOSIS — Z9889 Other specified postprocedural states: Secondary | ICD-10-CM

## 2020-12-12 NOTE — Progress Notes (Signed)
  Subjective:  Patient ID: Darryl Velazquez, male    DOB: 1961/05/02,  MRN: [id number]  Chief Complaint  Patient presents with   Routine Post Op    I am doing ok and the wedge shoe is hard to walk in and have almost fell a couple of times on the right foot   DOS: 12/02/20 Procedure: Right 5th metatarsal head excision  60 y.o. male presents with the above complaint. History confirmed with patient. He has been using the forefoot offloading shoe as this was the only shoe the surgical center had in his size.  Objective:  Physical Exam: tenderness at the surgical site, local edema noted, and calf supple, nontender. Incision: healing well, no significant drainage, no dehiscence, no significant erythema  No images are attached to the encounter.  Radiographs: X-ray of the right foot: no fracture, dislocation, swelling or degenerative changes noted   Assessment:   1. Tailor's bunionette, right   2. Post-operative state     Plan:  Patient was evaluated and treated and all questions answered.  Post-operative State -XR reviewed with patient -Dressing applied consisting of sterile gauze, kerlix, and ACE bandage -WBAT in Surgical shoe -Surgical shoe dispensed -XRs needed at follow-up: none   No follow-ups on file.

## 2020-12-20 ENCOUNTER — Other Ambulatory Visit: Payer: Self-pay

## 2020-12-20 ENCOUNTER — Ambulatory Visit (INDEPENDENT_AMBULATORY_CARE_PROVIDER_SITE_OTHER): Payer: BC Managed Care – PPO | Admitting: Sports Medicine

## 2020-12-20 ENCOUNTER — Encounter: Payer: Self-pay | Admitting: Sports Medicine

## 2020-12-20 DIAGNOSIS — M21621 Bunionette of right foot: Secondary | ICD-10-CM

## 2020-12-20 DIAGNOSIS — I739 Peripheral vascular disease, unspecified: Secondary | ICD-10-CM

## 2020-12-20 DIAGNOSIS — Z9889 Other specified postprocedural states: Secondary | ICD-10-CM

## 2020-12-20 DIAGNOSIS — M2040 Other hammer toe(s) (acquired), unspecified foot: Secondary | ICD-10-CM

## 2020-12-20 NOTE — Progress Notes (Signed)
Subjective: Darryl Velazquez is a 60 y.o. male patient seen today in office for POV #2 (DOS 12/02/20), S/P removal of bone at fifth toe and metatarsal right foot.  Patient denies pain at surgical site, but is nervous about suture removal, denies calf pain, denies headache, chest pain, shortness of breath, nausea, vomiting, fever, or chills. No other issues noted.   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[place])    Sleep apnea    Venous stasis dermatitis of both lower extremities    Hyperestrogenism 09/01/2019   Erectile dysfunction after radical prostatectomy 08/19/2018   Hypercoagulable state ([place]) 08/19/2018   NAFLD (nonalcoholic fatty liver disease) 08/19/2018   Pelvic floor dysfunction 08/19/2018   Personal history of prostate cancer 08/19/2018   Tobacco use disorder 08/19/2018   Elevated liver enzymes 03/27/2016   OSA on CPAP 03/27/2016   Class 2 severe obesity due to excess calories with serious comorbidity and body mass index (BMI) of 39.0 to 39.9 in adult [facility]) 03/11/2016   Arthritis 03/10/2016   Cancer of prostate with high recurrence risk (stage T3a or Gleason 8-10 or PSA > 20) 03/10/2016   Elevated ferritin 03/10/2016   Stroke ([place]) 03/10/2016    Current Outpatient Medications on File Prior to Visit  Medication Sig Dispense Refill   ALPRAZolam (XANAX) 0.25 MG tablet Take 0.25 mg by mouth 2 (two) times daily as needed.     aspirin EC 81 MG tablet Take 81 mg by mouth daily. Swallow whole.     docusate sodium (COLACE) 100 MG capsule Take 1 capsule (100 mg total) by mouth 2 (two) times daily. 10 capsule 0   furosemide (LASIX) 80 MG  tablet TAKE 1 TABLET BY MOUTH EVERY OTHER DAY 30 tablet 1   ibuprofen (ADVIL) 800 MG tablet Take 800 mg by mouth every 8 (eight) hours as needed.     Lactulose 20 GM/30ML SOLN Take 45 mLs (30 g total) by mouth in the morning and at bedtime. 1892 mL 0   promethazine (PHENERGAN) 25 MG tablet Take 1 tablet (25 mg total) by mouth every 8 (eight) hours as needed for nausea or vomiting. 20 tablet 0   spironolactone (ALDACTONE) 100 MG tablet Take 1 tablet (100 mg total) by mouth daily. 30 tablet 1   Current Facility-Administered Medications on File Prior to Visit  Medication Dose Route Frequency Provider Last Rate Last Admin   triamcinolone acetonide (KENALOG) 10 MG/ML injection 10 mg  10 mg Other Once Landis Martins, DPM        No Known Allergies  Objective: There were no vitals filed for this visit.  General: No acute distress, AAOx3  Right foot: Sutures intact with no gapping or dehiscence at surgical sites, mild swelling to right foot, no erythema, no warmth, no drainage, no signs of infection noted, Capillary fill time <3 seconds in all digits, gross sensation present via light touch to right foot. No pain or crepitation with range of motion right foot.  No pain with calf compression.    Assessment and Plan:  Problem List Items Addressed This Visit   None Visit Diagnoses  Tailor's bunionette, right    -  Primary   Hammer toe, unspecified laterality       Post-operative state       PVD (peripheral vascular disease) ([place])            -Patient seen and evaluated -Sutures removed -Applied dry sterile dressing to surgical site right foot secured with ACE wrap and stockinet  -Advised patient to make sure to keep dressings clean, dry, and intact to right foot for today but on tomorrow may remove for shower allowing Steri-Strips to fall off on their own-Advised patient to continue with post-op shoe on right foot for at least 1 more week after a week as long as there is no swelling may  transition to a normal shoe -Advised patient to limit activity to necessity and avoid any outdoor activities and cutting grass or weedeeding this weekend -Advised patient to ice and elevate as directed -Will plan for x-ray and wound check at next office visit. In the meantime, patient to call office if any issues or problems arise.   Landis Martins, DPM

## 2020-12-27 ENCOUNTER — Other Ambulatory Visit: Payer: Self-pay

## 2020-12-27 ENCOUNTER — Inpatient Hospital Stay (HOSPITAL_BASED_OUTPATIENT_CLINIC_OR_DEPARTMENT_OTHER): Payer: BC Managed Care – PPO | Admitting: Family

## 2020-12-27 ENCOUNTER — Telehealth: Payer: Self-pay | Admitting: *Deleted

## 2020-12-27 ENCOUNTER — Inpatient Hospital Stay: Payer: BC Managed Care – PPO | Attending: Family

## 2020-12-27 ENCOUNTER — Encounter: Payer: Self-pay | Admitting: Family

## 2020-12-27 DIAGNOSIS — Z7982 Long term (current) use of aspirin: Secondary | ICD-10-CM | POA: Diagnosis not present

## 2020-12-27 DIAGNOSIS — Z79899 Other long term (current) drug therapy: Secondary | ICD-10-CM | POA: Diagnosis not present

## 2020-12-27 DIAGNOSIS — K7460 Unspecified cirrhosis of liver: Secondary | ICD-10-CM | POA: Insufficient documentation

## 2020-12-27 LAB — CMP (CANCER CENTER ONLY)
ALT: 22 U/L (ref 0–44)
AST: 37 U/L (ref 15–41)
Albumin: 2.7 g/dL — ABNORMAL LOW (ref 3.5–5.0)
Alkaline Phosphatase: 134 U/L — ABNORMAL HIGH (ref 38–126)
Anion gap: 5 (ref 5–15)
BUN: 13 mg/dL (ref 6–20)
CO2: 29 mmol/L (ref 22–32)
Calcium: 9.3 mg/dL (ref 8.9–10.3)
Chloride: 104 mmol/L (ref 98–111)
Creatinine: 1.16 mg/dL (ref 0.61–1.24)
GFR, Estimated: >60 mL/min (ref >60)
Glucose, Bld: 128 mg/dL — ABNORMAL HIGH (ref 70–99)
Potassium: 4.2 mmol/L (ref 3.5–5.1)
Sodium: 138 mmol/L (ref 135–145)
Total Bilirubin: 1.5 mg/dL — ABNORMAL HIGH (ref 0.3–1.2)
Total Protein: 6.5 g/dL (ref 6.5–8.1)

## 2020-12-27 LAB — CBC WITH DIFFERENTIAL (CANCER CENTER ONLY)
Abs Immature Granulocytes: 0.02 10*3/uL (ref 0.00–0.07)
Basophils Absolute: 0.1 10*3/uL (ref 0.0–0.1)
Basophils Relative: 1 %
Eosinophils Absolute: 0.4 10*3/uL (ref 0.0–0.5)
Eosinophils Relative: 8 %
HCT: 33.6 % — ABNORMAL LOW (ref 39.0–52.0)
Hemoglobin: 11.2 g/dL — ABNORMAL LOW (ref 13.0–17.0)
Immature Granulocytes: 0 %
Lymphocytes Relative: 26 %
Lymphs Abs: 1.3 10*3/uL (ref 0.7–4.0)
MCH: 29.4 pg (ref 26.0–34.0)
MCHC: 33.3 g/dL (ref 30.0–36.0)
MCV: 88.2 fL (ref 80.0–100.0)
Monocytes Absolute: 1.2 10*3/uL — ABNORMAL HIGH (ref 0.1–1.0)
Monocytes Relative: 23 %
Neutro Abs: 2.1 10*3/uL (ref 1.7–7.7)
Neutrophils Relative %: 42 %
Platelet Count: 176 10*3/uL (ref 150–400)
RBC: 3.81 MIL/uL — ABNORMAL LOW (ref 4.22–5.81)
RDW: 16.4 % — ABNORMAL HIGH (ref 11.5–15.5)
WBC Count: 5 10*3/uL (ref 4.0–10.5)
nRBC: 0 % (ref 0.0–0.2)

## 2020-12-27 LAB — IRON AND TIBC
Iron: 213 ug/dL — ABNORMAL HIGH (ref 42–163)
Saturation Ratios: 100 % — ABNORMAL HIGH (ref 20–55)
TIBC: 214 ug/dL (ref 202–409)
UIBC: 0 ug/dL — ABNORMAL LOW (ref 117–376)

## 2020-12-27 NOTE — Progress Notes (Signed)
Hematology and Oncology Follow Up Visit  Darryl Velazquez [id number] 05-Jun-1961 60 y.o. 12/27/2020   Principle Diagnosis:  Hemochromatosis, heterozygous for the H63D mutation  Cirrhosis - NASH   Current Therapy:        Phlebotomy with [facility] PRN to maintain ferritin < 100 and iron saturation < 50%   Interim History:  Darryl Velazquez is here today for follow-up. He is symptomatic with extreme fatigue.  He started treatment with Lactulose in May for hepatic encephalopathy. Ammonia level at the time was 89.   He states that due to frequent BM's, lower extremity swelling with standing for an extended period of time and the fatigue he was let go from his job.  He has a new 1.5 in x 1 in mobile solid mass noted at the 3 o'clock positions of the left breast. He states that this is sore at times. We discussed set him up for a mammogram to further assess. He wants to hold off for now due to financial strain with his recent job loss. He will let us know if he changes his mind.  No fever, chills, n/v, cough, rash, dizziness, SOB, chest pain, palpitations, abdominal pain or changes in bowel or bladder habits.  No tenderness, numbness or tingling in his extremities at this time.  The swelling in his feet and ankles is stable/unchanged. Pedal pulses are 2+.  No falls or syncope to report.  He has a good appetite and is doing his best to stay well hydrated. His weight is stable at 305 lbs.   ECOG Performance Status: 1 - Symptomatic but completely ambulatory  Medications:  Allergies as of 12/27/2020   No Known Allergies      Medication List        Accurate as of December 27, 2020  8:40 AM. If you have any questions, ask your nurse or doctor.          ALPRAZolam 0.25 MG tablet Commonly known as: XANAX Take 0.25 mg by mouth 2 (two) times daily as needed.   aspirin EC 81 MG tablet Take 81 mg by mouth daily. Swallow whole.   docusate sodium 100 MG capsule Commonly known as: Colace Take 1  capsule (100 mg total) by mouth 2 (two) times daily.   furosemide 80 MG tablet Commonly known as: LASIX TAKE 1 TABLET BY MOUTH EVERY OTHER DAY   ibuprofen 800 MG tablet Commonly known as: ADVIL Take 800 mg by mouth every 8 (eight) hours as needed.   Lactulose 20 GM/30ML Soln Take 45 mLs (30 g total) by mouth in the morning and at bedtime.   promethazine 25 MG tablet Commonly known as: PHENERGAN Take 1 tablet (25 mg total) by mouth every 8 (eight) hours as needed for nausea or vomiting.   spironolactone 100 MG tablet Commonly known as: Aldactone Take 1 tablet (100 mg total) by mouth daily.        Allergies: No Known Allergies  Past Medical History, Surgical history, Social history, and Family History were reviewed and updated.  Review of Systems: All other 10 point review of systems is negative.   Physical Exam:  vitals were not taken for this visit.   Wt Readings from Last 3 Encounters:  11/14/20 293 lb 2 oz (133 kg)  09/27/20 291 lb (132 kg)  08/27/20 (!) 320 lb 8 oz (145.4 kg)    Ocular: Sclerae unicteric, pupils equal, round and reactive to light Ear-nose-throat: Oropharynx clear, dentition fair Lymphatic: No cervical, supraclavicular pr  axillary adenopathy Lungs no rales or rhonchi, good excursion bilaterally Heart regular rate and rhythm, no murmur appreciated Abd soft, nontender, positive bowel sounds MSK no focal spinal tenderness, no joint edema Neuro: non-focal, well-oriented, appropriate affect Breasts: Has a 1.5 in x 1 in mobile lump at the 3 o'clock position of the left breast. Right breast exam negative. No rashes or lesions noted.   Lab Results  Component Value Date   WBC 5.0 12/27/2020   HGB 11.2 (L) 12/27/2020   HCT 33.6 (L) 12/27/2020   MCV 88.2 12/27/2020   PLT 176 12/27/2020   Lab Results  Component Value Date   FERRITIN 282 11/14/2020   IRON 271 (H) 11/14/2020   TIBC 265 11/14/2020   UIBC UNABLE TO CALCULATE 11/14/2020   IRONPCTSAT  102 (H) 11/14/2020   Lab Results  Component Value Date   RBC 3.81 (L) 12/27/2020   No results found for: KPAFRELGTCHN, LAMBDASER, [facility] Lab Results  Component Value Date   IGGSERUM 2,209 (H) 05/08/2020   IGMSERUM 195 05/08/2020   No results found for: Kathrynn Ducking, MSPIKE, SPEI   Chemistry      Component Value Date/Time   NA 134 (L) 11/14/2020 0800   NA 140 04/19/2020 1458   K 3.9 11/14/2020 0800   CL 100 11/14/2020 0800   CO2 29 11/14/2020 0800   BUN 15 11/14/2020 0800   BUN 11 04/19/2020 1458   CREATININE 0.96 11/14/2020 0800      Component Value Date/Time   CALCIUM 9.0 11/14/2020 0800   ALKPHOS 128 (H) 11/14/2020 0800   AST 54 (H) 11/14/2020 0800   ALT 32 11/14/2020 0800   BILITOT 2.7 (H) 11/14/2020 0800       Impression and Plan: Darryl Velazquez is a very pleasant 60 yo caucasian gentleman with cirrhosis and hemochromatosis, heterozygous for the H63D mutations.  Iron studies are pending. We will have him go to [facility] if needed.  Lab only in 6 weeks and follow-up and lab in 3 months.  Again, he was encouraged to move forward with mammogram but decided to hold off for now. He will let us know if he decides to proceed.  He can contact our office with any questions or concerns.   Laverna Peace, NP 7/22/20228:40 AM

## 2020-12-27 NOTE — Telephone Encounter (Signed)
Per 12/27/20 los called and gave patient's wife upcoming appointments - confirmed

## 2020-12-30 LAB — FERRITIN: Ferritin: 262 ng/mL (ref 24–336)

## 2020-12-31 ENCOUNTER — Telehealth: Payer: Self-pay

## 2020-12-31 NOTE — Telephone Encounter (Signed)
Called and informed patient's wife Shirlean Mylar) of lab results, wife verbalized understanding and states they will be out of town next week. Per Judson Roch- ok to wait as long as he does 3 consecutive weeks after that.

## 2020-12-31 NOTE — Telephone Encounter (Signed)
-----   Message from Eliezer Bottom, NP sent at 12/31/2020 10:57 AM EDT ----- He needs to go donate with [facility] weekly for 3 weeks please. I will fax an order to them! Thank you!  Sarah  ----- Message ----- From: Buel Ream, [facility] [place] Sent: 12/27/2020   8:16 AM EDT To: Eliezer Bottom, NP

## 2021-01-03 ENCOUNTER — Ambulatory Visit: Payer: Self-pay

## 2021-01-03 ENCOUNTER — Ambulatory Visit: Payer: Self-pay | Admitting: Neurology

## 2021-01-03 ENCOUNTER — Other Ambulatory Visit: Payer: Self-pay

## 2021-01-03 ENCOUNTER — Encounter: Payer: Self-pay | Admitting: Sports Medicine

## 2021-01-03 ENCOUNTER — Encounter: Payer: BC Managed Care – PPO | Admitting: Sports Medicine

## 2021-01-03 ENCOUNTER — Ambulatory Visit (INDEPENDENT_AMBULATORY_CARE_PROVIDER_SITE_OTHER): Payer: BC Managed Care – PPO | Admitting: Sports Medicine

## 2021-01-03 DIAGNOSIS — M2040 Other hammer toe(s) (acquired), unspecified foot: Secondary | ICD-10-CM

## 2021-01-03 DIAGNOSIS — M79671 Pain in right foot: Secondary | ICD-10-CM

## 2021-01-03 DIAGNOSIS — Z9889 Other specified postprocedural states: Secondary | ICD-10-CM

## 2021-01-03 DIAGNOSIS — M21621 Bunionette of right foot: Secondary | ICD-10-CM

## 2021-01-03 DIAGNOSIS — I739 Peripheral vascular disease, unspecified: Secondary | ICD-10-CM

## 2021-01-03 NOTE — Progress Notes (Signed)
Subjective: Darryl Velazquez is a 60 y.o. male patient seen today in office for POV #3 (DOS 12/02/20), S/P removal of bone at fifth toe and metatarsal right foot.  Patient reports that he is doing good. Twinges of pain but nothing constant. Swelling. No other issues noted.   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[place])    Sleep apnea    Venous stasis dermatitis of both lower extremities    Hyperestrogenism 09/01/2019   Erectile dysfunction after radical prostatectomy 08/19/2018   Hypercoagulable state ([place]) 08/19/2018   NAFLD (nonalcoholic fatty liver disease) 08/19/2018   Pelvic floor dysfunction 08/19/2018   Personal history of prostate cancer 08/19/2018   Tobacco use disorder 08/19/2018   Elevated liver enzymes 03/27/2016   OSA on CPAP 03/27/2016   Class 2 severe obesity due to excess calories with serious comorbidity and body mass index (BMI) of 39.0 to 39.9 in adult [facility]) 03/11/2016   Arthritis 03/10/2016   Cancer of prostate with high recurrence risk (stage T3a or Gleason 8-10 or PSA > 20) 03/10/2016   Elevated ferritin 03/10/2016   Stroke ([place]) 03/10/2016    Current Outpatient Medications on File Prior to Visit  Medication Sig Dispense Refill   ALPRAZolam (XANAX) 0.25 MG tablet Take 0.25 mg by mouth 2 (two) times daily as needed.     aspirin EC 81 MG tablet Take 81 mg by mouth daily. Swallow whole. (Patient not taking: Reported on 12/27/2020)     docusate sodium (COLACE) 100 MG capsule Take 1 capsule (100 mg total) by mouth 2 (two) times daily. 10 capsule 0   furosemide (LASIX) 80 MG tablet TAKE 1 TABLET BY MOUTH EVERY OTHER DAY 30  tablet 1   Lactulose 20 GM/30ML SOLN Take 45 mLs (30 g total) by mouth in the morning and at bedtime. 1892 mL 0   promethazine (PHENERGAN) 25 MG tablet Take 1 tablet (25 mg total) by mouth every 8 (eight) hours as needed for nausea or vomiting. 20 tablet 0   spironolactone (ALDACTONE) 100 MG tablet Take 1 tablet (100 mg total) by mouth daily. 30 tablet 1   No current facility-administered medications on file prior to visit.    No Known Allergies  Objective: There were no vitals filed for this visit.  General: No acute distress, AAOx3  Right foot: Incision well healed, no gapping or dehiscence at surgical sites, mild swelling to right foot, no erythema, no warmth, no drainage, no signs of infection noted, Capillary fill time <3 seconds in all digits, gross sensation present via light touch to right foot. No pain or crepitation with range of motion right foot.  No pain with calf compression.   Xrays consistent with post op status  Assessment and Plan:  Problem List Items Addressed This Visit   None Visit Diagnoses     Tailor's bunionette, right    -  Primary   Relevant Orders   DG Foot Complete Right   Hammer toe, unspecified laterality       Relevant Orders   DG Foot Complete Right   Post-operative state  PVD (peripheral vascular disease) ([place])       Right foot pain           -Patient seen and evaluated -Xrays reviewed -Surgical site well healed -Continue with good supportive shoes -Continue with elevation and lasix for edema control as given by PCP -Will plan for POV/Post op check at next office visit. In the meantime, patient to call office if any issues or problems arise.   Landis Martins, DPM

## 2021-01-14 ENCOUNTER — Encounter: Payer: BC Managed Care – PPO | Admitting: Sports Medicine

## 2021-01-31 ENCOUNTER — Other Ambulatory Visit: Payer: Self-pay

## 2021-01-31 ENCOUNTER — Encounter: Payer: Self-pay | Admitting: Sports Medicine

## 2021-01-31 ENCOUNTER — Ambulatory Visit: Payer: BC Managed Care – PPO

## 2021-01-31 ENCOUNTER — Ambulatory Visit (INDEPENDENT_AMBULATORY_CARE_PROVIDER_SITE_OTHER): Payer: BC Managed Care – PPO | Admitting: Sports Medicine

## 2021-01-31 DIAGNOSIS — M21621 Bunionette of right foot: Secondary | ICD-10-CM

## 2021-01-31 DIAGNOSIS — I739 Peripheral vascular disease, unspecified: Secondary | ICD-10-CM

## 2021-01-31 DIAGNOSIS — M2040 Other hammer toe(s) (acquired), unspecified foot: Secondary | ICD-10-CM

## 2021-01-31 DIAGNOSIS — Z9889 Other specified postprocedural states: Secondary | ICD-10-CM

## 2021-01-31 NOTE — Progress Notes (Signed)
Subjective: Darryl Velazquez is a 60 y.o. male patient seen today in office for POV #4 (DOS 12/02/20), S/P removal of bone at fifth toe and metatarsal right foot.  Patient reports that he is doing good with occasional swelling. No other issues noted.   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[place])    Sleep apnea    Venous stasis dermatitis of both lower extremities    Hyperestrogenism 09/01/2019   Erectile dysfunction after radical prostatectomy 08/19/2018   Hypercoagulable state ([place]) 08/19/2018   NAFLD (nonalcoholic fatty liver disease) 08/19/2018   Pelvic floor dysfunction 08/19/2018   Personal history of prostate cancer 08/19/2018   Tobacco use disorder 08/19/2018   Elevated liver enzymes 03/27/2016   OSA on CPAP 03/27/2016   Class 2 severe obesity due to excess calories with serious comorbidity and body mass index (BMI) of 39.0 to 39.9 in adult [facility]) 03/11/2016   Arthritis 03/10/2016   Cancer of prostate with high recurrence risk (stage T3a or Gleason 8-10 or PSA > 20) 03/10/2016   Elevated ferritin 03/10/2016   Stroke ([place]) 03/10/2016    Current Outpatient Medications on File Prior to Visit  Medication Sig Dispense Refill   ALPRAZolam (XANAX) 0.25 MG tablet Take 0.25 mg by mouth 2 (two) times daily as needed.     aspirin EC 81 MG tablet Take 81 mg by mouth daily. Swallow whole. (Patient not taking: Reported on 12/27/2020)     docusate sodium (COLACE) 100 MG capsule Take 1 capsule (100 mg total) by mouth 2 (two) times daily. 10 capsule 0   furosemide (LASIX) 80 MG tablet TAKE 1 TABLET BY MOUTH EVERY OTHER DAY 30 tablet 1   Lactulose 20  GM/30ML SOLN Take 45 mLs (30 g total) by mouth in the morning and at bedtime. 1892 mL 0   promethazine (PHENERGAN) 25 MG tablet Take 1 tablet (25 mg total) by mouth every 8 (eight) hours as needed for nausea or vomiting. 20 tablet 0   spironolactone (ALDACTONE) 100 MG tablet Take 1 tablet (100 mg total) by mouth daily. 30 tablet 1   No current facility-administered medications on file prior to visit.    No Known Allergies  Objective: There were no vitals filed for this visit.  General: No acute distress, AAOx3  Right foot: Incision well healed, no gapping or dehiscence at surgical sites, minimal swelling to right foot, no erythema, no warmth, no drainage, no signs of infection noted, Capillary fill time <3 seconds in all digits, gross sensation present via light touch to right foot. No pain or crepitation with range of motion right foot.  Subjective numbness over the dorsal incision.  No pain with calf compression.   Assessment and Plan:  Problem List Items Addressed This Visit   None Visit Diagnoses     Tailor's bunionette, right    -  Primary   Hammer toe, unspecified laterality       Post-operative state       PVD (peripheral vascular disease) ([place])           -Patient seen and evaluated -Surgical site well-healed -Advised  patient to increase activities to tolerance -Continue with elevation and lasix for edema control as given by PCP -Patient discharged from postoperative care return as needed  Landis Martins, DPM

## 2021-02-06 ENCOUNTER — Inpatient Hospital Stay: Payer: BC Managed Care – PPO | Attending: Family

## 2021-02-06 ENCOUNTER — Other Ambulatory Visit: Payer: Self-pay

## 2021-02-06 LAB — CMP (CANCER CENTER ONLY)
ALT: 32 U/L (ref 0–44)
AST: 50 U/L — ABNORMAL HIGH (ref 15–41)
Albumin: 3.2 g/dL — ABNORMAL LOW (ref 3.5–5.0)
Alkaline Phosphatase: 150 U/L — ABNORMAL HIGH (ref 38–126)
Anion gap: 6 (ref 5–15)
BUN: 15 mg/dL (ref 6–20)
CO2: 30 mmol/L (ref 22–32)
Calcium: 9.7 mg/dL (ref 8.9–10.3)
Chloride: 97 mmol/L — ABNORMAL LOW (ref 98–111)
Creatinine: 1.09 mg/dL (ref 0.61–1.24)
GFR, Estimated: >60 mL/min (ref >60)
Glucose, Bld: 108 mg/dL — ABNORMAL HIGH (ref 70–99)
Potassium: 4 mmol/L (ref 3.5–5.1)
Sodium: 133 mmol/L — ABNORMAL LOW (ref 135–145)
Total Bilirubin: 3.2 mg/dL — ABNORMAL HIGH (ref 0.3–1.2)
Total Protein: 7.4 g/dL (ref 6.5–8.1)

## 2021-02-06 LAB — CBC WITH DIFFERENTIAL (CANCER CENTER ONLY)
Abs Immature Granulocytes: 0.02 10*3/uL (ref 0.00–0.07)
Basophils Absolute: 0.1 10*3/uL (ref 0.0–0.1)
Basophils Relative: 1 %
Eosinophils Absolute: 0.3 10*3/uL (ref 0.0–0.5)
Eosinophils Relative: 5 %
HCT: 36.2 % — ABNORMAL LOW (ref 39.0–52.0)
Hemoglobin: 12.5 g/dL — ABNORMAL LOW (ref 13.0–17.0)
Immature Granulocytes: 0 %
Lymphocytes Relative: 27 %
Lymphs Abs: 1.5 10*3/uL (ref 0.7–4.0)
MCH: 29.7 pg (ref 26.0–34.0)
MCHC: 34.5 g/dL (ref 30.0–36.0)
MCV: 86 fL (ref 80.0–100.0)
Monocytes Absolute: 1.3 10*3/uL — ABNORMAL HIGH (ref 0.1–1.0)
Monocytes Relative: 24 %
Neutro Abs: 2.4 10*3/uL (ref 1.7–7.7)
Neutrophils Relative %: 43 %
Platelet Count: 178 10*3/uL (ref 150–400)
RBC: 4.21 MIL/uL — ABNORMAL LOW (ref 4.22–5.81)
RDW: 16.2 % — ABNORMAL HIGH (ref 11.5–15.5)
WBC Count: 5.5 10*3/uL (ref 4.0–10.5)
nRBC: 0 % (ref 0.0–0.2)

## 2021-02-07 ENCOUNTER — Other Ambulatory Visit: Payer: BC Managed Care – PPO

## 2021-02-07 ENCOUNTER — Inpatient Hospital Stay: Payer: BC Managed Care – PPO

## 2021-02-07 LAB — FERRITIN: Ferritin: 255 ng/mL (ref 24–336)

## 2021-02-07 LAB — IRON AND TIBC
Iron: 216 ug/dL — ABNORMAL HIGH (ref 42–163)
Saturation Ratios: 79 % — ABNORMAL HIGH (ref 20–55)
TIBC: 274 ug/dL (ref 202–409)
UIBC: 58 ug/dL — ABNORMAL LOW (ref 117–376)

## 2021-02-11 ENCOUNTER — Other Ambulatory Visit: Payer: Self-pay | Admitting: Gastroenterology

## 2021-02-11 ENCOUNTER — Other Ambulatory Visit: Payer: Self-pay | Admitting: Family

## 2021-02-11 ENCOUNTER — Telehealth: Payer: Self-pay | Admitting: *Deleted

## 2021-02-11 NOTE — Telephone Encounter (Addendum)
-----   Message from Eliezer Bottom, NP sent at 02/11/2021  9:35 AM EDT ----- Called patient and talked to his wife to let her know that she needs a phlebotomy every other week x 3 please! We will fax [facility] an order :) Thank you!  Sarah ----- Message ----- From: Buel Ream, Lab In [place] Sent: 02/06/2021   1:17 PM EDT To: Eliezer Bottom, NP

## 2021-03-05 ENCOUNTER — Other Ambulatory Visit: Payer: Self-pay | Admitting: Family

## 2021-03-12 DIAGNOSIS — Z6835 Body mass index (BMI) 35.0-35.9, adult: Secondary | ICD-10-CM | POA: Diagnosis not present

## 2021-03-12 DIAGNOSIS — K7460 Unspecified cirrhosis of liver: Secondary | ICD-10-CM | POA: Diagnosis not present

## 2021-03-12 DIAGNOSIS — F331 Major depressive disorder, recurrent, moderate: Secondary | ICD-10-CM | POA: Diagnosis not present

## 2021-03-20 ENCOUNTER — Ambulatory Visit (INDEPENDENT_AMBULATORY_CARE_PROVIDER_SITE_OTHER): Payer: BC Managed Care – PPO | Admitting: Gastroenterology

## 2021-03-20 ENCOUNTER — Encounter: Payer: Self-pay | Admitting: Gastroenterology

## 2021-03-20 ENCOUNTER — Other Ambulatory Visit: Payer: Self-pay

## 2021-03-20 VITALS — BP 144/70 | HR 70 | Ht 76.0 in | Wt 292.2 lb

## 2021-03-20 DIAGNOSIS — K7460 Unspecified cirrhosis of liver: Secondary | ICD-10-CM | POA: Diagnosis not present

## 2021-03-20 DIAGNOSIS — R040 Epistaxis: Secondary | ICD-10-CM | POA: Diagnosis not present

## 2021-03-20 DIAGNOSIS — K7682 Hepatic encephalopathy: Secondary | ICD-10-CM

## 2021-03-20 DIAGNOSIS — K7581 Nonalcoholic steatohepatitis (NASH): Secondary | ICD-10-CM | POA: Diagnosis not present

## 2021-03-20 DIAGNOSIS — R112 Nausea with vomiting, unspecified: Secondary | ICD-10-CM

## 2021-03-20 MED ORDER — RIFAXIMIN 550 MG PO TABS: 550.0000 mg | ORAL_TABLET | Freq: Two times a day (BID) | ORAL | 3 refills | Status: DC

## 2021-03-20 NOTE — Patient Instructions (Addendum)
If you are age 60 or older, your body mass index should be between 23-30. Your Body mass index is 35.57 kg/m. If this is out of the aforementioned range listed, please consider follow up with your Primary Care Provider.  If you are age 8 or younger, your body mass index should be between 19-25. Your Body mass index is 35.57 kg/m. If this is out of the aformentioned range listed, please consider follow up with your Primary Care Provider.   __________________________________________________________  The [facility] providers would like to encourage you to use [facility] to communicate with providers for non-urgent requests or questions.  Due to long hold times on the telephone, sending your provider a message by [facility] may be a faster and more efficient way to get a response.  Please allow 48 business hours for a response.  Please remember that this is for non-urgent requests.  ___________________________________________________________  Consider adding of zinc, branch chain amino acids (BCAA), and L-ornithine L-aspartate (LOLA) supplements .  We have sent the following medications to your pharmacy for you to pick up at your convenience:  We have sent your prescription of rifaximin to a specialty pharmacy named [facility]. They help [place] with prior authorization.   We have placed a referral to an ENT to help with your nose bleeds. They will contact you with an appointment.  Follow up in our clinic in 6 months or sooner if needed.  Thank you for choosing me and [facility].  Vito Cirigliano, D.O.

## 2021-03-20 NOTE — Progress Notes (Signed)
Chief Complaint:    Nausea, hematemesis  GI History: 60 year old male with a history of prostate cancer (surgery only), history of CVA (1st at age 67), obesity (BMI 19),  hereditary hemochromatosis, dyslipidemia, peripheral vascular disease, sleep apnea.     -Appears he has had mildly elevated liver enzymes since 03/2016 (AST/ALT 56/67) with normal T bili 0.6 at that time. -04/2020: Evaluated by Dr. [name] in Cardiology clinic for new onset LE edema.  TTE unremarkable.  Started on diuretics. -Follows in the Hematology clinic for hemochromatosis with heterozygous H63D mutation.  Started weekly phlebotomy in 06/2020.   Cirrhosis Evaluation: - Etiology: NASH.  Hemochromatosis not felt to be contributory - Complications: Radiographic ascites, portal hypertensive gastropathy, lower extremity edema, hepatic encephalopathy - HCC screening: UTD.  Elevated AFP, but MRI/MRCP 11/2020 without HCC - Variceal screening: EGD 10/2020.  No EV - Serologic evaluation: Completed 05/2020. Hemochromatosis DNA positive for 1 H63D (carrier of hemochromatosis). -Elevated IgA (856), elevated IgG (2209), elevated/positive antimitochondrial antibody (43.2), and elevated AFP (12.7). Ferritin 419 (previously 968), iron saturation at 62.5% . Normal/negative ANA, ASMA, ceruloplasmin, viral hepatitis panel, IgM, HIV - Viral hepatitis vaccination: Negative HBsAb, HAVAb.  Completed vaccine series - Flu vaccine: UTD - Liver biopsy: 05/16/2020: Chronic minimally active hepatitis with prominent bridging septa and focal nodularity suggestive of cirrhosis (grade 1, stage IV).  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  Feel primary insult is NASH cirrhosis less likely  hemochromatosis due to heterozygous carrier H63D which carries lower risk of warning involvement.  While he does have positive AMA, no evidence of PBC on biopsy and normal IgM.  Plan for continued observation every 6 months.   Most recent imaging: - 11/2020: MRI: Cirrhotic appearing liver with trace perihepatic ascites.  No HCC     Endoscopic history: -Colonoscopy x2, last being ~3 years ago in [place] and n/f polyps, with plan to repeat 5 years.  No records for review.   -EGD (05/2020): Normal esophagus, moderate portal hypertensive gastritis, moderate duodenitis.  Referred to ENT for polypoid lesion of base of tongue.  Started high-dose PPI - EGD (10/2020, inpatient at [facility]): No esophageal varices, portal hypertensive gastropathy   FHx: - Father with stomach CA in his 22's  HPI:     Patient is a 60 y.o. male presenting to the Gastroenterology Clinic for follow-up.  Last seen by me on 11/14/2020, and since then was seen in [place] on 12/19/2020.  Continues to follow in the Hematology clinic for hemochromatosis management.  Main issue today is c/o nausea with occasional episodes of hematemesis.  Interestingly, he has intermittent epistaxis from the right nostril during the episodes of hematemesis.  Nausea otherwise frequent with infrequent emesis. Similar sxs have been ongoing since admission to [facility] in May (EGD at that time with PHG but no active bleeding), but nausea worsening over last month or so. Nausea improves with Zofran.   Was unable to previously get rifaximin ordered by Hepatology clinic.  We will replace that order today.  Otherwise taking all medications as prescribed.  LE edema has been relatively well controlled.  Did have a recent fall a few weeks ago.  By description, sounds like a mechanical fall, but per wife, did have some slurred speech  a day or 2 before that episode.  Otherwise no confusion, gait unsteadiness, etc. since that event.    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   Venous stasis dermatitis of both lower extremities     Patient's surgical history, family medical history, social history, medications and allergies were all reviewed in Epic    Current Outpatient Medications  Medication Sig Dispense Refill   aspirin EC 81 MG tablet Take 81 mg by mouth daily. Swallow whole.     buPROPion (WELLBUTRIN XL) 150 MG 24 hr tablet Take 150 mg by mouth every morning.     docusate sodium (COLACE) 100 MG capsule Take 1 capsule (100 mg total) by mouth 2 (two) times daily. 10 capsule 0   furosemide (LASIX) 80 MG tablet TAKE 1 TABLET BY MOUTH EVERY OTHER DAY 30 tablet 1   Lactulose 20 GM/30ML SOLN Take 45 mLs (30 g total) by mouth in the morning and at bedtime. 1892 mL 0   ondansetron (ZOFRAN-ODT) 4 MG disintegrating tablet Take 4 mg by mouth every 8 (eight) hours as needed.     spironolactone (ALDACTONE) 100 MG tablet Take 1 tablet (100 mg total) by mouth daily. 30 tablet 1   No current facility-administered medications for this visit.    Physical Exam:     BP (!) 144/70   Pulse 70   Ht 6' 4"  (1.93 m)   Wt 292 lb 4 oz (132.6 kg)   SpO2 100%   BMI 35.57 kg/m   GENERAL:  Pleasant male in NAD PSYCH: : Cooperative, normal affect NEURO: Alert and oriented x 3, no focal neurologic deficits   IMPRESSION and PLAN:    1) NASH Cirrhosis 2) Lower extremity edema 3) Heterozygous carrier for hemochromatosis/Iron overload 4) Portal hypertensive gastropathy 5) Radiographic  ascites 6) Hepatic encephalopathy  -Rifaximin 550 mg BID - Continue lactulose - Continue low-sodium diet - Continue Lasix and Aldactone at current dose - Patient no longer able to work due to LE edema when on his feet for any prolonged period of time - Continue follow-up in the [place] clinic as scheduled on 06/18/2021 - RUQ [place] in early 2023 for ongoing Axis screening - Start zinc, branch chain amino acids (BCAA), and L-ornithine L-aspartate (LOLA) supplements - EGD in 2024 for EV screening - Continue follow-up family Hematology clinic for hemochromatosis  7) Epistaxis - ENT referral  8) Nausea/Vomiting - Nausea multifactorial, but description of hematemesis sounds like it is mostly epistaxis in origin.  ENT referral as above - Continue Zofran as needed for nausea - MRI brain in 10/2020 with mild chronic ischemic changes but otherwise no pathology to explain nausea  9) Depression - Recent diagnosis of depression and started on Wellbutrin.  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

## 2021-03-31 ENCOUNTER — Encounter: Payer: Self-pay | Admitting: Family

## 2021-03-31 ENCOUNTER — Inpatient Hospital Stay: Payer: BC Managed Care – PPO | Admitting: Family

## 2021-03-31 ENCOUNTER — Other Ambulatory Visit: Payer: Self-pay

## 2021-03-31 ENCOUNTER — Inpatient Hospital Stay: Payer: BC Managed Care – PPO | Attending: Family

## 2021-03-31 DIAGNOSIS — K7581 Nonalcoholic steatohepatitis (NASH): Secondary | ICD-10-CM | POA: Insufficient documentation

## 2021-03-31 DIAGNOSIS — Z79899 Other long term (current) drug therapy: Secondary | ICD-10-CM | POA: Diagnosis not present

## 2021-03-31 LAB — CBC WITH DIFFERENTIAL (CANCER CENTER ONLY)
Abs Immature Granulocytes: 0.01 10*3/uL (ref 0.00–0.07)
Basophils Absolute: 0.1 10*3/uL (ref 0.0–0.1)
Basophils Relative: 1 %
Eosinophils Absolute: 0.2 10*3/uL (ref 0.0–0.5)
Eosinophils Relative: 4 %
HCT: 35.7 % — ABNORMAL LOW (ref 39.0–52.0)
Hemoglobin: 12.1 g/dL — ABNORMAL LOW (ref 13.0–17.0)
Immature Granulocytes: 0 %
Lymphocytes Relative: 29 %
Lymphs Abs: 1.3 10*3/uL (ref 0.7–4.0)
MCH: 29 pg (ref 26.0–34.0)
MCHC: 33.9 g/dL (ref 30.0–36.0)
MCV: 85.6 fL (ref 80.0–100.0)
Monocytes Absolute: 0.9 10*3/uL (ref 0.1–1.0)
Monocytes Relative: 21 %
Neutro Abs: 2 10*3/uL (ref 1.7–7.7)
Neutrophils Relative %: 45 %
Platelet Count: 194 10*3/uL (ref 150–400)
RBC: 4.17 MIL/uL — ABNORMAL LOW (ref 4.22–5.81)
RDW: 14.7 % (ref 11.5–15.5)
WBC Count: 4.5 10*3/uL (ref 4.0–10.5)
nRBC: 0 % (ref 0.0–0.2)

## 2021-03-31 LAB — CMP (CANCER CENTER ONLY)
ALT: 34 U/L (ref 0–44)
AST: 53 U/L — ABNORMAL HIGH (ref 15–41)
Albumin: 3.1 g/dL — ABNORMAL LOW (ref 3.5–5.0)
Alkaline Phosphatase: 123 U/L (ref 38–126)
Anion gap: 6 (ref 5–15)
BUN: 15 mg/dL (ref 6–20)
CO2: 28 mmol/L (ref 22–32)
Calcium: 9.5 mg/dL (ref 8.9–10.3)
Chloride: 101 mmol/L (ref 98–111)
Creatinine: 1.06 mg/dL (ref 0.61–1.24)
GFR, Estimated: >60 mL/min (ref >60)
Glucose, Bld: 127 mg/dL — ABNORMAL HIGH (ref 70–99)
Potassium: 4.3 mmol/L (ref 3.5–5.1)
Sodium: 135 mmol/L (ref 135–145)
Total Bilirubin: 2.6 mg/dL — ABNORMAL HIGH (ref 0.3–1.2)
Total Protein: 7 g/dL (ref 6.5–8.1)

## 2021-03-31 LAB — FERRITIN: Ferritin: 137 ng/mL (ref 24–336)

## 2021-03-31 LAB — IRON AND TIBC
Iron: 116 ug/dL (ref 45–182)
Saturation Ratios: 41 % — ABNORMAL HIGH (ref 17.9–39.5)
TIBC: 284 ug/dL (ref 250–450)
UIBC: 168 ug/dL

## 2021-03-31 NOTE — Progress Notes (Signed)
Hematology and Oncology Follow Up Visit  Darryl Velazquez [id number] [birth year] 60 y.o. 03/31/2021   Principle Diagnosis:  Hemochromatosis, heterozygous for the H63D mutation  Cirrhosis - NASH   Current Therapy:        Phlebotomy with [facility] PRN to maintain ferritin < 100 and iron saturation < 50%   Interim History:  Darryl Velazquez is here today for follow-up. He is still having a hard time with n/v and occasional bloody emesis. He states that his work up with GI has been negative so far but that he has been referred to ENT and is waiting for the call from their office to schedule.  No other blood loss noted. No bruising or petechiae.  He denies fever, chills, cough, rash, dizziness, SOB, chest pain, palpitations, abdominal pain or changes in bowel or bladder habits.  He has been donating as directed with [facility]. Iron studies for today are pending. Hgb is 12.1, Hct 35.7, WBC count 4.5 and platelets 194.  No swelling, tenderness, numbness or tingling in his extremities at this time.  No falls or syncope to report.  The mass previously noted in the left breast at the 3 o'clock position is not longer noted on exam.  No adenopathy.  He has an appetite but states that when he goes to eat he no longer wants it. He is staying well hydrated. His weight is 284 lbs. He is dow 21 lbs since we last saw him in July.   ECOG Performance Status: 1 - Symptomatic but completely ambulatory  Medications:  Allergies as of 03/31/2021   No Known Allergies      Medication List        Accurate as of March 31, 2021 10:26 AM. If you have any questions, ask your nurse or doctor.          aspirin EC 81 MG tablet Take 81 mg by mouth daily. Swallow whole.   buPROPion 150 MG 24 hr tablet Commonly known as: WELLBUTRIN XL Take 150 mg by mouth every morning.   docusate sodium 100 MG capsule Commonly known as: Colace Take 1 capsule (100 mg total) by mouth 2 (two) times daily.   furosemide 80 MG  tablet Commonly known as: LASIX TAKE 1 TABLET BY MOUTH EVERY OTHER DAY   Lactulose 20 GM/30ML Soln Take 45 mLs (30 g total) by mouth in the morning and at bedtime.   ondansetron 4 MG disintegrating tablet Commonly known as: ZOFRAN-ODT Take 4 mg by mouth every 8 (eight) hours as needed.   rifaximin 550 MG Tabs tablet Commonly known as: XIFAXAN Take 1 tablet (550 mg total) by mouth 2 (two) times daily.   spironolactone 100 MG tablet Commonly known as: Aldactone Take 1 tablet (100 mg total) by mouth daily.        Allergies: No Known Allergies  Past Medical History, Surgical history, Social history, and Family History were reviewed and updated.  Review of Systems: All other 10 point review of systems is negative.   Physical Exam:  weight is 284 lb 1.9 oz (128.9 kg). His oral temperature is 98.5 F (36.9 C). His blood pressure is 141/72 (abnormal) and his pulse is 60. His respiration is 18 and oxygen saturation is 100%.   Wt Readings from Last 3 Encounters:  03/31/21 284 lb 1.9 oz (128.9 kg)  03/20/21 292 lb 4 oz (132.6 kg)  12/27/20 (!) 305 lb 1.9 oz (138.4 kg)    Ocular: Sclerae unicteric, pupils equal, round and  reactive to light Ear-nose-throat: Oropharynx clear, dentition fair Lymphatic: No cervical, supraclavicular or axillary adenopathy Lungs no rales or rhonchi, good excursion bilaterally Heart regular rate and rhythm, no murmur appreciated Abd soft, nontender, positive bowel sounds MSK no focal spinal tenderness, no joint edema Neuro: non-focal, well-oriented, appropriate affect Breasts: No mass or lesion noted on exam.   Lab Results  Component Value Date   WBC 4.5 03/31/2021   HGB 12.1 (L) 03/31/2021   HCT 35.7 (L) 03/31/2021   MCV 85.6 03/31/2021   PLT 194 03/31/2021   Lab Results  Component Value Date   FERRITIN 255 02/06/2021   IRON 216 (H) 02/06/2021   TIBC 274 02/06/2021   UIBC 58 (L) 02/06/2021   IRONPCTSAT 79 (H) 02/06/2021   Lab Results   Component Value Date   RBC 4.17 (L) 03/31/2021   No results found for: KPAFRELGTCHN, LAMBDASER, [facility] Lab Results  Component Value Date   IGGSERUM 2,209 (H) 05/08/2020   IGMSERUM 195 05/08/2020   No results found for: Kathrynn Ducking, MSPIKE, SPEI   Chemistry      Component Value Date/Time   NA 133 (L) 02/06/2021 1301   NA 140 04/19/2020 1458   K 4.0 02/06/2021 1301   CL 97 (L) 02/06/2021 1301   CO2 30 02/06/2021 1301   BUN 15 02/06/2021 1301   BUN 11 04/19/2020 1458   CREATININE 1.09 02/06/2021 1301      Component Value Date/Time   CALCIUM 9.7 02/06/2021 1301   ALKPHOS 150 (H) 02/06/2021 1301   AST 50 (H) 02/06/2021 1301   ALT 32 02/06/2021 1301   BILITOT 3.2 (H) 02/06/2021 1301       Impression and Plan: Darryl Velazquez is a very pleasant 60 yo caucasian gentleman with cirrhosis and hemochromatosis, heterozygous for the H63D mutations.  His iron studies are improving. Saturation is now 41% and ferritin 137.  We will have him donate with [facility] this week and again in 2 weeks.  Lab check in 6 weeks, follow-up in 3 months.  He can contact our office with any questions or concerns and will go to the ED in the event of an emergency.   Lottie Dawson, NP 10/24/202210:26 AM

## 2021-04-01 ENCOUNTER — Telehealth: Payer: Self-pay

## 2021-04-01 ENCOUNTER — Telehealth: Payer: Self-pay | Admitting: *Deleted

## 2021-04-01 NOTE — Telephone Encounter (Signed)
No 03/31/21 LOS

## 2021-04-01 NOTE — Telephone Encounter (Signed)
LMTCB for results. 

## 2021-04-01 NOTE — Telephone Encounter (Signed)
-----   Message from Celso Amy, NP sent at 04/01/2021  1:22 PM EDT ----- He needs to donate with [facility] this week and again in 2 weeks. I have faxed an order form to them. Thank you! Lab check again in 6 weeks and follow-up in 3 months.    ----- Message ----- From: Buel Ream, Lab In [place] Sent: 03/31/2021  10:11 AM EDT To: Celso Amy, NP

## 2021-04-02 ENCOUNTER — Telehealth: Payer: Self-pay | Admitting: Family

## 2021-04-07 DIAGNOSIS — R112 Nausea with vomiting, unspecified: Secondary | ICD-10-CM | POA: Diagnosis not present

## 2021-04-07 DIAGNOSIS — K7460 Unspecified cirrhosis of liver: Secondary | ICD-10-CM | POA: Diagnosis not present

## 2021-04-07 DIAGNOSIS — F331 Major depressive disorder, recurrent, moderate: Secondary | ICD-10-CM | POA: Diagnosis not present

## 2021-04-16 ENCOUNTER — Telehealth: Payer: Self-pay | Admitting: General Surgery

## 2021-04-16 DIAGNOSIS — K7581 Nonalcoholic steatohepatitis (NASH): Secondary | ICD-10-CM

## 2021-04-16 DIAGNOSIS — K760 Fatty (change of) liver, not elsewhere classified: Secondary | ICD-10-CM

## 2021-04-16 DIAGNOSIS — K3189 Other diseases of stomach and duodenum: Secondary | ICD-10-CM

## 2021-04-16 NOTE — Telephone Encounter (Signed)
-----   Message from Villa Herb, [place] sent at 12/05/2020  2:32 PM EDT ----- Regarding: Follow up Per Dr C from patient's MRI/MRCP done 11/2020 plan for repeat ultrasound and AFP at 6 months for continued [facility] screening

## 2021-04-17 ENCOUNTER — Telehealth: Payer: Self-pay | Admitting: General Surgery

## 2021-04-17 DIAGNOSIS — K3189 Other diseases of stomach and duodenum: Secondary | ICD-10-CM

## 2021-04-17 DIAGNOSIS — K7581 Nonalcoholic steatohepatitis (NASH): Secondary | ICD-10-CM

## 2021-04-17 DIAGNOSIS — K7460 Unspecified cirrhosis of liver: Secondary | ICD-10-CM

## 2021-04-17 NOTE — Telephone Encounter (Signed)
-----   Message from Villa Herb, [place] sent at 12/05/2020  2:32 PM EDT ----- Regarding: Follow up Per Dr C from patient's MRI/MRCP done 11/2020 plan for repeat ultrasound and AFP at 6 months for continued [facility] screening

## 2021-05-14 ENCOUNTER — Other Ambulatory Visit: Payer: Self-pay

## 2021-05-14 ENCOUNTER — Inpatient Hospital Stay: Payer: BC Managed Care – PPO | Attending: Family

## 2021-05-14 LAB — CBC WITH DIFFERENTIAL (CANCER CENTER ONLY)
Abs Immature Granulocytes: 0 10*3/uL (ref 0.00–0.07)
Basophils Absolute: 0.1 10*3/uL (ref 0.0–0.1)
Basophils Relative: 1 %
Eosinophils Absolute: 0.3 10*3/uL (ref 0.0–0.5)
Eosinophils Relative: 7 %
HCT: 32.1 % — ABNORMAL LOW (ref 39.0–52.0)
Hemoglobin: 10.6 g/dL — ABNORMAL LOW (ref 13.0–17.0)
Immature Granulocytes: 0 %
Lymphocytes Relative: 27 %
Lymphs Abs: 1.2 10*3/uL (ref 0.7–4.0)
MCH: 28 pg (ref 26.0–34.0)
MCHC: 33 g/dL (ref 30.0–36.0)
MCV: 84.7 fL (ref 80.0–100.0)
Monocytes Absolute: 0.7 10*3/uL (ref 0.1–1.0)
Monocytes Relative: 16 %
Neutro Abs: 2.1 10*3/uL (ref 1.7–7.7)
Neutrophils Relative %: 49 %
Platelet Count: 177 10*3/uL (ref 150–400)
RBC: 3.79 MIL/uL — ABNORMAL LOW (ref 4.22–5.81)
RDW: 16.3 % — ABNORMAL HIGH (ref 11.5–15.5)
WBC Count: 4.3 10*3/uL (ref 4.0–10.5)
nRBC: 0 % (ref 0.0–0.2)

## 2021-05-14 LAB — CMP (CANCER CENTER ONLY)
ALT: 34 U/L (ref 0–44)
AST: 57 U/L — ABNORMAL HIGH (ref 15–41)
Albumin: 2.8 g/dL — ABNORMAL LOW (ref 3.5–5.0)
Alkaline Phosphatase: 164 U/L — ABNORMAL HIGH (ref 38–126)
Anion gap: 5 (ref 5–15)
BUN: 12 mg/dL (ref 6–20)
CO2: 28 mmol/L (ref 22–32)
Calcium: 8.8 mg/dL — ABNORMAL LOW (ref 8.9–10.3)
Chloride: 103 mmol/L (ref 98–111)
Creatinine: 0.88 mg/dL (ref 0.61–1.24)
GFR, Estimated: >60 mL/min (ref >60)
Glucose, Bld: 177 mg/dL — ABNORMAL HIGH (ref 70–99)
Potassium: 4.2 mmol/L (ref 3.5–5.1)
Sodium: 136 mmol/L (ref 135–145)
Total Bilirubin: 2.3 mg/dL — ABNORMAL HIGH (ref 0.3–1.2)
Total Protein: 6.3 g/dL — ABNORMAL LOW (ref 6.5–8.1)

## 2021-05-15 LAB — IRON AND TIBC
Iron: 100 ug/dL (ref 42–163)
Saturation Ratios: 41 % (ref 20–55)
TIBC: 241 ug/dL (ref 202–409)
UIBC: 141 ug/dL (ref 117–376)

## 2021-05-15 LAB — FERRITIN: Ferritin: 140 ng/mL (ref 24–336)

## 2021-05-19 ENCOUNTER — Telehealth: Payer: Self-pay | Admitting: Family

## 2021-05-27 ENCOUNTER — Telehealth: Payer: Self-pay | Admitting: General Surgery

## 2021-05-27 NOTE — Telephone Encounter (Signed)
LVM for Shirlean Mylar regarding the patients labs and [place], they need to be scheduled

## 2021-05-27 NOTE — Telephone Encounter (Signed)
-----   Message from Villa Herb, [place] sent at 12/05/2020  2:32 PM EDT ----- Regarding: Follow up Per Dr C from patient's MRI/MRCP done 11/2020 plan for repeat ultrasound and AFP at 6 months for continued [facility] screening

## 2021-06-01 DIAGNOSIS — I252 Old myocardial infarction: Secondary | ICD-10-CM | POA: Diagnosis not present

## 2021-06-01 DIAGNOSIS — Z20822 Contact with and (suspected) exposure to covid-19: Secondary | ICD-10-CM | POA: Diagnosis not present

## 2021-06-01 DIAGNOSIS — R42 Dizziness and giddiness: Secondary | ICD-10-CM | POA: Diagnosis not present

## 2021-06-01 DIAGNOSIS — Z7902 Long term (current) use of antithrombotics/antiplatelets: Secondary | ICD-10-CM | POA: Diagnosis not present

## 2021-06-01 DIAGNOSIS — R1110 Vomiting, unspecified: Secondary | ICD-10-CM | POA: Diagnosis not present

## 2021-06-01 DIAGNOSIS — K529 Noninfective gastroenteritis and colitis, unspecified: Secondary | ICD-10-CM | POA: Diagnosis not present

## 2021-06-01 DIAGNOSIS — F17210 Nicotine dependence, cigarettes, uncomplicated: Secondary | ICD-10-CM | POA: Diagnosis not present

## 2021-06-01 DIAGNOSIS — I10 Essential (primary) hypertension: Secondary | ICD-10-CM | POA: Diagnosis not present

## 2021-06-01 DIAGNOSIS — Z79899 Other long term (current) drug therapy: Secondary | ICD-10-CM | POA: Diagnosis not present

## 2021-06-01 DIAGNOSIS — R519 Headache, unspecified: Secondary | ICD-10-CM | POA: Diagnosis not present

## 2021-06-01 HISTORY — DX: Noninfective gastroenteritis and colitis, unspecified: K52.9

## 2021-06-05 ENCOUNTER — Telehealth (HOSPITAL_BASED_OUTPATIENT_CLINIC_OR_DEPARTMENT_OTHER): Payer: Self-pay

## 2021-06-10 NOTE — Telephone Encounter (Signed)
Inbound call from patient stating he called central scheduling and they informed patient they needed an order before scheduling.  Please advise.

## 2021-06-10 NOTE — Telephone Encounter (Signed)
Spoke with Vivien Rota, she is able to see the order it was marked for [facility] so she changed it to [facility] and stated she would contact the patient to schedule.

## 2021-06-11 DIAGNOSIS — Z6833 Body mass index (BMI) 33.0-33.9, adult: Secondary | ICD-10-CM | POA: Diagnosis not present

## 2021-06-11 DIAGNOSIS — K7460 Unspecified cirrhosis of liver: Secondary | ICD-10-CM | POA: Diagnosis not present

## 2021-06-11 DIAGNOSIS — K529 Noninfective gastroenteritis and colitis, unspecified: Secondary | ICD-10-CM | POA: Diagnosis not present

## 2021-06-20 ENCOUNTER — Telehealth: Payer: Self-pay | Admitting: General Surgery

## 2021-06-20 ENCOUNTER — Other Ambulatory Visit: Payer: Self-pay | Admitting: Gastroenterology

## 2021-06-20 ENCOUNTER — Ambulatory Visit (HOSPITAL_COMMUNITY)
Admission: RE | Admit: 2021-06-20 | Discharge: 2021-06-20 | Disposition: A | Discharge status: Home / Self Care | Payer: BC Managed Care – PPO | Source: Ambulatory Visit | Attending: Gastroenterology | Admitting: Gastroenterology

## 2021-06-20 DIAGNOSIS — R188 Other ascites: Secondary | ICD-10-CM | POA: Diagnosis not present

## 2021-06-20 DIAGNOSIS — K3189 Other diseases of stomach and duodenum: Secondary | ICD-10-CM | POA: Insufficient documentation

## 2021-06-20 DIAGNOSIS — K7469 Other cirrhosis of liver: Secondary | ICD-10-CM | POA: Diagnosis not present

## 2021-06-20 DIAGNOSIS — K7460 Unspecified cirrhosis of liver: Secondary | ICD-10-CM

## 2021-06-20 DIAGNOSIS — K7581 Nonalcoholic steatohepatitis (NASH): Secondary | ICD-10-CM | POA: Diagnosis not present

## 2021-06-20 DIAGNOSIS — K760 Fatty (change of) liver, not elsewhere classified: Secondary | ICD-10-CM | POA: Insufficient documentation

## 2021-06-20 DIAGNOSIS — K766 Portal hypertension: Secondary | ICD-10-CM | POA: Diagnosis not present

## 2021-06-20 DIAGNOSIS — R935 Abnormal findings on diagnostic imaging of other abdominal regions, including retroperitoneum: Secondary | ICD-10-CM | POA: Diagnosis not present

## 2021-06-20 IMAGING — MR MR ABDOMEN WO/W CM MRCP
19 of 21 series · 43 of 48 positions shown · IV contrast (gadavist)
Comparison: [DATE]

CLINICAL DATA: Cirrhosis and portal venous hypertension. Personal
history of prostate carcinoma.

EXAM:
MRI ABDOMEN WITHOUT AND WITH CONTRAST (INCLUDING MRCP)
TECHNIQUE: Multiplanar multisequence MR imaging of the abdomen was performed
both before and after the administration of intravenous contrast.
Heavily T2-weighted images of the biliary and pancreatic ducts were
obtained, and three-dimensional MRCP images were rendered by post
processing.
CONTRAST:  10mL GADAVIST GADOBUTROL 1 MMOL/ML IV SOLN

[Series 3: T2 fat-sat · axial · 6.0mm · 1.34mm/px · 1 of 40 slices shown]
[im 1/40]
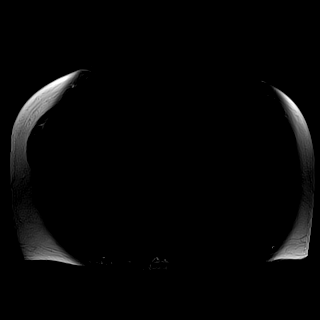

[Series 5: DWI · axial · 6.0mm · 1.60mm/px · z∈[-235,+67]mm · 2 of 86 slices shown (1 of 2)]
[im 1/86]
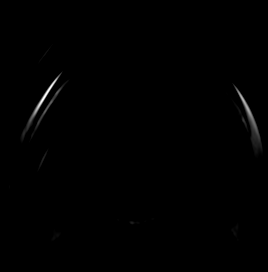
[im 86/86]
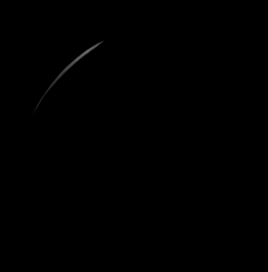

[Series 6: DWI · axial · 6.0mm · 1.60mm/px · 1 of 43 slices shown (2 of 2)]
[im 1/43]
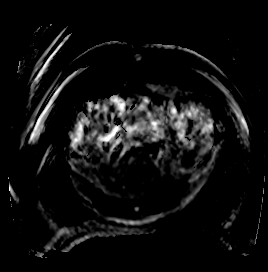

[Series 8: cor_3d_spc_trig · coronal · 1.0mm · 0.49mm/px · 2 of 80 slices shown]
[im 1/80]
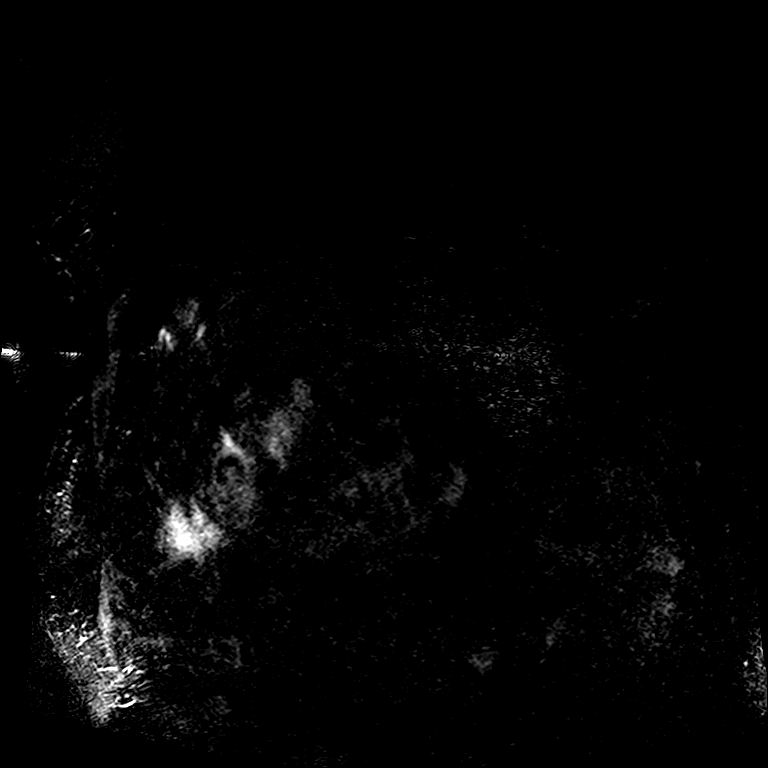
[im 80/80]
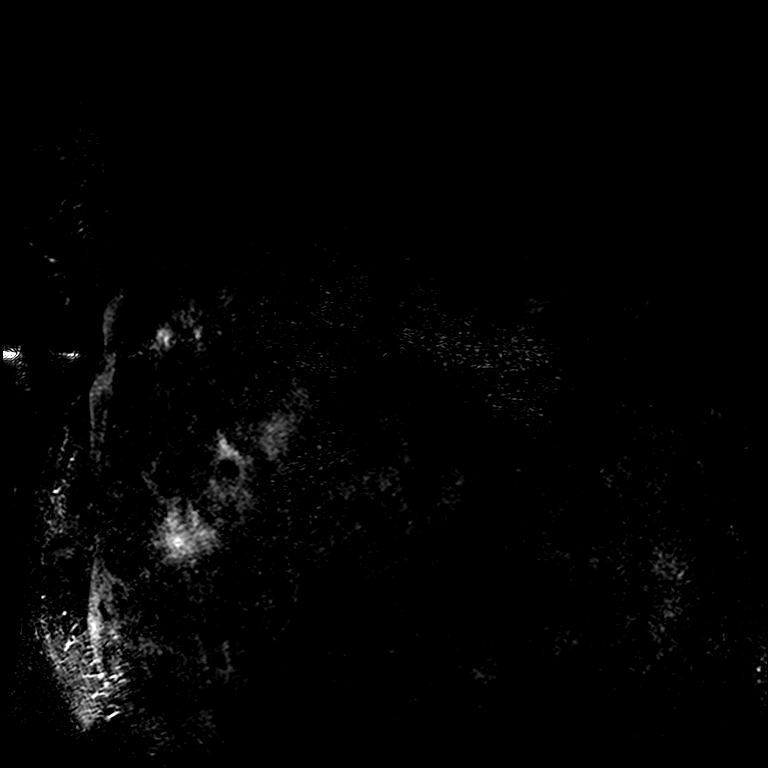

[Series 11: T2 · coronal · 6.0mm · 1.64mm/px · 1 of 30 slices shown (1 of 2)]
[im 1/30]
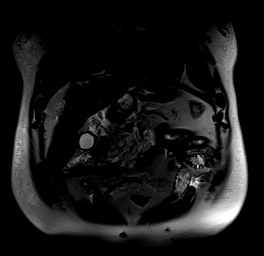

[Series 12: T1 · axial · 3.0mm · 1.34mm/px · z∈[-210,+51]mm · 3 of 88 slices shown (1 of 2)]
[im 1/88]
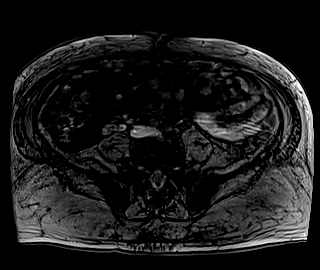
[im 44/88]
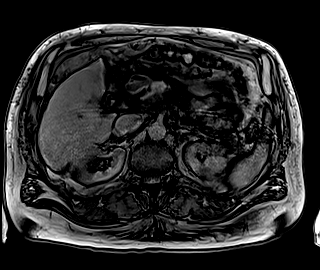
[im 88/88]
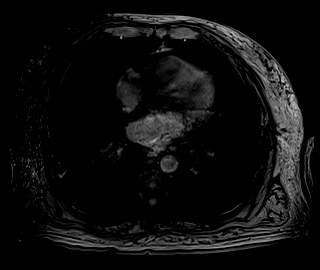

[Series 13: T1 · axial · 3.0mm · 1.34mm/px · z∈[-210,+51]mm · 3 of 88 slices shown (2 of 2)]
[im 1/88]
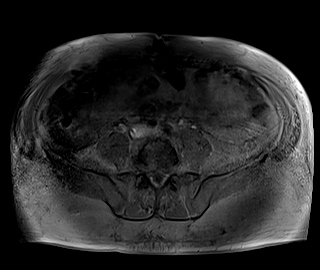
[im 44/88]
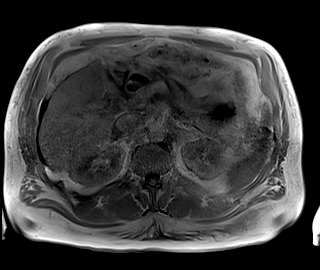
[im 88/88]
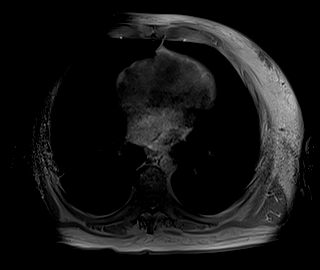

[Series 14: cor obl thk · sagittal · 50.0mm · 0.78mm/px · 1 of 9 slices shown]
[im 1/9]
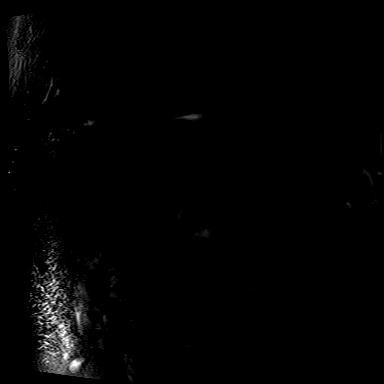

[Series 15: T2 · axial · 6.0mm · 1.68mm/px · 1 of 44 slices shown (2 of 2)]
[im 1/44]
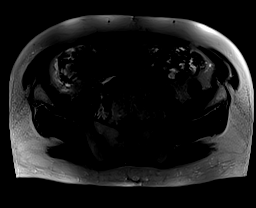

[Series 18: T1 dynamic · axial · 3.0mm · 1.34mm/px · z∈[-225,+36]mm · 3 of 88 slices shown (1 of 10)]
[im 1/88]
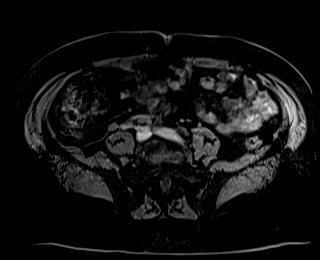
[im 44/88]
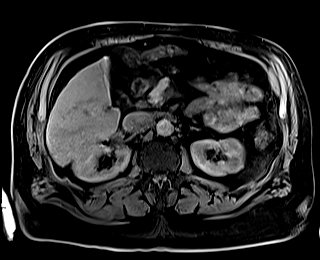
[im 88/88]
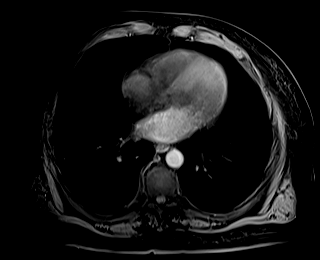

[Series 22: T1 dynamic · axial · 3.0mm · 1.34mm/px · z∈[-225,+36]mm · 3 of 88 slices shown (2 of 10)]
[im 1/88]
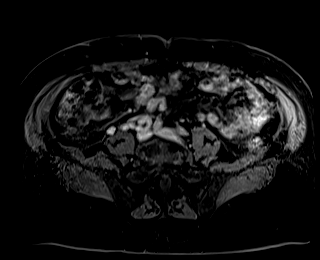
[im 44/88]
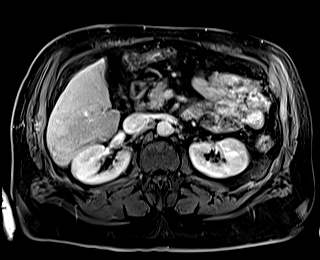
[im 88/88]
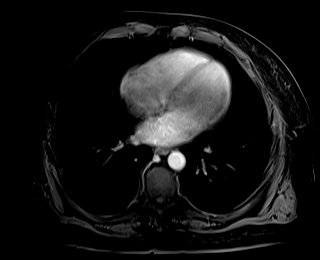

[Series 23: T1 dynamic · axial · 3.0mm · 1.34mm/px · z∈[-225,+36]mm · 3 of 88 slices shown (3 of 10)]
[im 1/88]
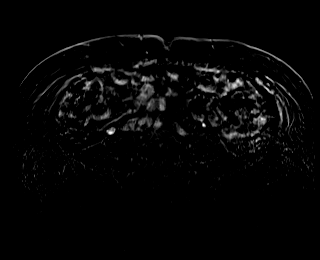
[im 44/88]
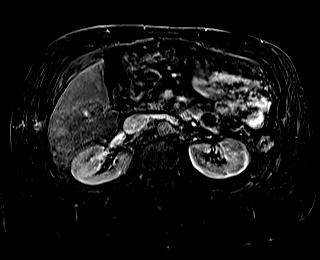
[im 88/88]
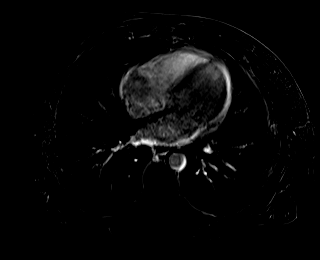

[Series 26: T1 dynamic · axial · 3.0mm · 1.34mm/px · z∈[-225,+36]mm · 3 of 88 slices shown (4 of 10)]
[im 1/88]
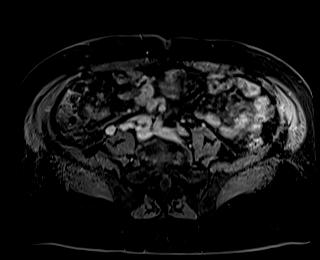
[im 44/88]
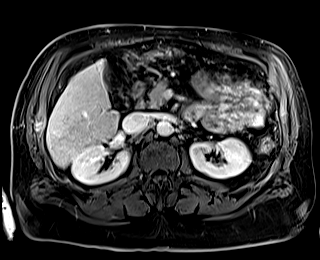
[im 88/88]
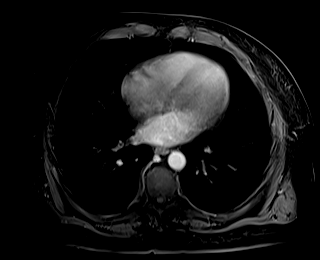

[Series 27: T1 dynamic · axial · 3.0mm · 1.34mm/px · z∈[-225,+36]mm · 3 of 88 slices shown (5 of 10)]
[im 1/88]
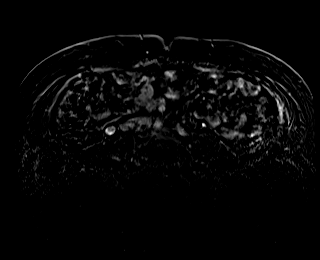
[im 44/88]
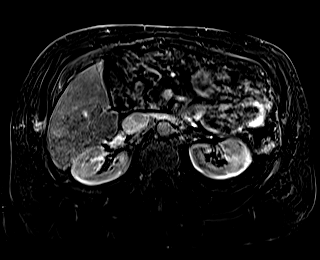
[im 88/88]
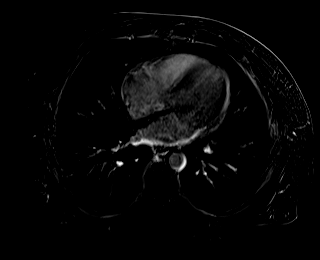

[Series 30: T1 dynamic · axial · 3.0mm · 1.34mm/px · z∈[-225,+36]mm · 3 of 88 slices shown (6 of 10)]
[im 1/88]
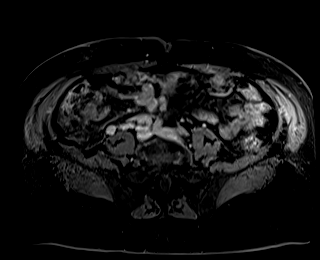
[im 44/88]
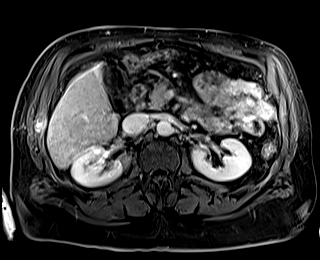
[im 88/88]
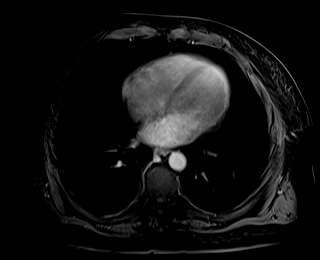

[Series 31: T1 dynamic · axial · 3.0mm · 1.34mm/px · z∈[-225,+36]mm · 3 of 88 slices shown (7 of 10)]
[im 1/88]
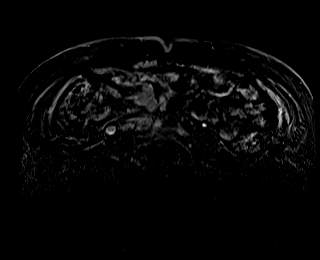
[im 44/88]
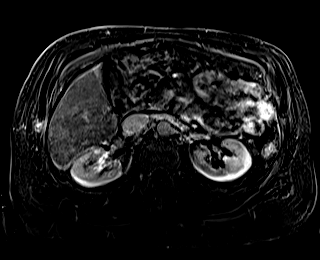
[im 88/88]
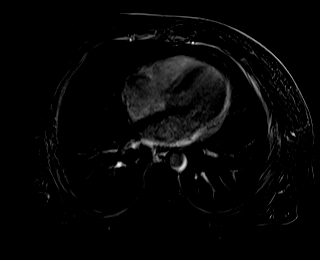

[Series 33: T1 dynamic · coronal · 5.0mm · 1.41mm/px · 2 of 64 slices shown (8 of 10)]
[im 1/64]
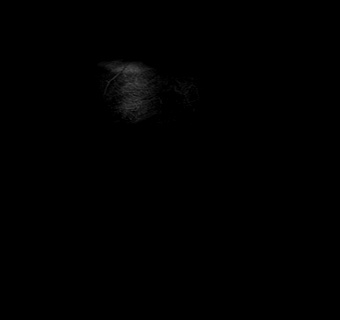
[im 64/64]
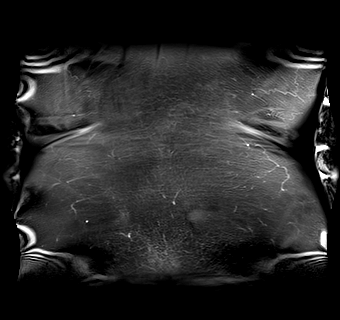

[Series 36: T1 dynamic · axial · 3.0mm · 1.34mm/px · z∈[-225,+36]mm · 3 of 88 slices shown (9 of 10)]
[im 1/88]
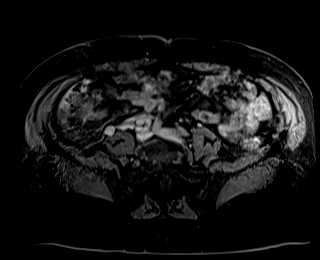
[im 44/88]
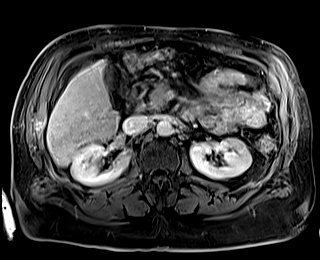
[im 88/88]
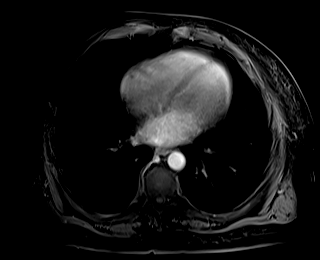

[Series 37: T1 dynamic · axial · 3.0mm · 1.34mm/px · z∈[-225,-96]mm · 2 of 88 slices shown (10 of 10)]
[im 1/88]
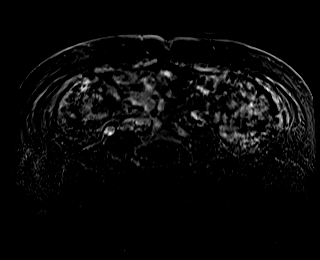
[im 44/88]
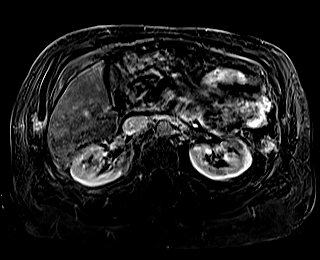

[43 of 48 positions shown; findings below may reference images not displayed]

FINDINGS: Lower chest: No acute findings.

Hepatobiliary: Image degradation by motion artifact noted. Hepatic
cirrhosis is again demonstrated. No hepatic masses identified.
Gallbladder is unremarkable. No evidence of biliary ductal
dilatation.

Pancreas:  No mass or inflammatory changes.

Spleen:  Within normal limits in size and appearance.

Adrenals/Urinary Tract: No masses identified. No evidence of
hydronephrosis.

Stomach/Bowel: Unremarkable.

Vascular/Lymphatic: No pathologically enlarged lymph nodes
identified. No acute vascular findings. Recanalization of
paraumbilical vein and right abdominal mesenteric varices are
consistent with portal venous hypertension.

Other: Mild perihepatic ascites. Mild diffuse mesenteric and body
wall edema.

Musculoskeletal:  No suspicious bone lesions identified.
IMPRESSION: Image degradation by motion artifact noted.

Hepatic cirrhosis, and findings of portal venous hypertension. No
radiographic evidence of hepatocellular carcinoma.

Mild ascites.

## 2021-06-20 MED ORDER — GADOBUTROL 1 MMOL/ML IV SOLN
10.0000 mL | Freq: Once | INTRAVENOUS | Status: AC | PRN
  Administered 2021-06-20: 10 mL via INTRAVENOUS

## 2021-06-20 NOTE — Telephone Encounter (Signed)
Spoke with patients wife, gave her results to MRCP. She verbalized understanding, was told to call if any questions or concerns. Expressed to Shirlean Mylar we will have Macedonio back in 6 months for the AFP lab and his RUQ [place]. Orders placed and sent to Nabina in a staff message to schedule in 6 months.

## 2021-06-20 NOTE — Telephone Encounter (Signed)
-----   Message from Elmer, DO sent at 06/20/2021  2:49 PM EST ----- MRI/MRCP with cirrhotic appearing liver and portal hypertensive changes secondary to cirrhosis.  Otherwise, no mass or concern for hepatocellular carcinoma (i.e. liver cancer).  Mild perihepatic ascites and wall edema all consistent with underlying cirrhosis.  Plan for RUQ [place] and AFP in 6 months for ongoing [place] screening

## 2021-06-24 ENCOUNTER — Encounter: Payer: Self-pay | Admitting: Gastroenterology

## 2021-06-25 ENCOUNTER — Encounter: Payer: Self-pay | Admitting: Gastroenterology

## 2021-06-26 DIAGNOSIS — M791 Myalgia, unspecified site: Secondary | ICD-10-CM | POA: Diagnosis not present

## 2021-06-26 DIAGNOSIS — R051 Acute cough: Secondary | ICD-10-CM | POA: Diagnosis not present

## 2021-06-26 DIAGNOSIS — J0100 Acute maxillary sinusitis, unspecified: Secondary | ICD-10-CM | POA: Diagnosis not present

## 2021-07-02 ENCOUNTER — Other Ambulatory Visit: Payer: Self-pay

## 2021-07-02 ENCOUNTER — Inpatient Hospital Stay: Payer: BC Managed Care – PPO | Attending: Family | Admitting: Family

## 2021-07-02 ENCOUNTER — Encounter: Payer: Self-pay | Admitting: Family

## 2021-07-02 ENCOUNTER — Inpatient Hospital Stay: Payer: BC Managed Care – PPO

## 2021-07-02 LAB — CBC WITH DIFFERENTIAL (CANCER CENTER ONLY)
Abs Immature Granulocytes: 0.01 10*3/uL (ref 0.00–0.07)
Basophils Absolute: 0 10*3/uL (ref 0.0–0.1)
Basophils Relative: 1 %
Eosinophils Absolute: 0.1 10*3/uL (ref 0.0–0.5)
Eosinophils Relative: 1 %
HCT: 33.2 % — ABNORMAL LOW (ref 39.0–52.0)
Hemoglobin: 11.4 g/dL — ABNORMAL LOW (ref 13.0–17.0)
Immature Granulocytes: 0 %
Lymphocytes Relative: 22 %
Lymphs Abs: 1.5 10*3/uL (ref 0.7–4.0)
MCH: 27.9 pg (ref 26.0–34.0)
MCHC: 34.3 g/dL (ref 30.0–36.0)
MCV: 81.4 fL (ref 80.0–100.0)
Monocytes Absolute: 1.3 10*3/uL — ABNORMAL HIGH (ref 0.1–1.0)
Monocytes Relative: 18 %
Neutro Abs: 4.2 10*3/uL (ref 1.7–7.7)
Neutrophils Relative %: 58 %
Platelet Count: 160 10*3/uL (ref 150–400)
RBC: 4.08 MIL/uL — ABNORMAL LOW (ref 4.22–5.81)
RDW: 19 % — ABNORMAL HIGH (ref 11.5–15.5)
WBC Count: 7.1 10*3/uL (ref 4.0–10.5)
nRBC: 0 % (ref 0.0–0.2)

## 2021-07-02 LAB — CMP (CANCER CENTER ONLY)
ALT: 26 U/L (ref 0–44)
AST: 40 U/L (ref 15–41)
Albumin: 3 g/dL — ABNORMAL LOW (ref 3.5–5.0)
Alkaline Phosphatase: 168 U/L — ABNORMAL HIGH (ref 38–126)
Anion gap: 6 (ref 5–15)
BUN: 16 mg/dL (ref 6–20)
CO2: 26 mmol/L (ref 22–32)
Calcium: 8.9 mg/dL (ref 8.9–10.3)
Chloride: 105 mmol/L (ref 98–111)
Creatinine: 0.8 mg/dL (ref 0.61–1.24)
GFR, Estimated: >60 mL/min (ref >60)
Glucose, Bld: 91 mg/dL (ref 70–99)
Potassium: 3.9 mmol/L (ref 3.5–5.1)
Sodium: 137 mmol/L (ref 135–145)
Total Bilirubin: 2.6 mg/dL — ABNORMAL HIGH (ref 0.3–1.2)
Total Protein: 6.9 g/dL (ref 6.5–8.1)

## 2021-07-02 NOTE — Progress Notes (Signed)
Hematology and Oncology Follow Up Visit  Darryl Velazquez [id number] [birth year] 61 y.o. 07/02/2021   Principle Diagnosis:  Hemochromatosis, heterozygous for the H63D mutation  Cirrhosis - NASH   Current Therapy:        Phlebotomy with [facility] PRN to maintain ferritin < 100 and iron saturation < 50%   Interim History:  Darryl Velazquez is here today for follow-up. He is doing fairly well. He recently got over a bout of colitis followed by sinusitis.  No blood loss noted. No bruising or petechiae.  He recently tried to donate with [facility] but they could not access a vein and felt he was not well hydrated. He will try to make sure to drink 2-3 bottle of water before he goes in the future.  No fever, chills, n/v, cough, rash, dizziness, SOB, chest pain, palpitations, abdominal pain or changes in bowel or bladder habits.  No swelling, tenderness, numbness or tingling in his extremities at this time.  No falls or syncope.  He has lost his appetite and states that mostly eats popcorn and Klondike bars. He is doing his best to stay well hydrated. His stable at 288 lbs.   ECOG Performance Status: 1 - Symptomatic but completely ambulatory  Medications:  Allergies as of 07/02/2021   No Known Allergies      Medication List        Accurate as of July 02, 2021  1:23 PM. If you have any questions, ask your nurse or doctor.          aspirin EC 81 MG tablet Take 81 mg by mouth daily. Swallow whole.   buPROPion 150 MG 24 hr tablet Commonly known as: WELLBUTRIN XL Take 150 mg by mouth every morning.   docusate sodium 100 MG capsule Commonly known as: Colace Take 1 capsule (100 mg total) by mouth 2 (two) times daily.   furosemide 80 MG tablet Commonly known as: LASIX TAKE 1 TABLET BY MOUTH EVERY OTHER DAY   Lactulose 20 GM/30ML Soln Take 45 mLs (30 g total) by mouth in the morning and at bedtime.   ondansetron 4 MG disintegrating tablet Commonly known as: ZOFRAN-ODT Take 4 mg by  mouth every 8 (eight) hours as needed.   rifaximin 550 MG Tabs tablet Commonly known as: XIFAXAN Take 1 tablet (550 mg total) by mouth 2 (two) times daily.   spironolactone 100 MG tablet Commonly known as: Aldactone Take 1 tablet (100 mg total) by mouth daily.        Allergies: No Known Allergies  Past Medical History, Surgical history, Social history, and Family History were reviewed and updated.  Review of Systems: All other 10 point review of systems is negative.   Physical Exam:  height is 6' 4"  (1.93 m) and weight is 288 lb 1.9 oz (130.7 kg). His oral temperature is 98.7 F (37.1 C). His blood pressure is 141/71 (abnormal) and his pulse is 60. His respiration is 18 and oxygen saturation is 100%.   Wt Readings from Last 3 Encounters:  07/02/21 288 lb 1.9 oz (130.7 kg)  03/31/21 284 lb 1.9 oz (128.9 kg)  03/20/21 292 lb 4 oz (132.6 kg)    Ocular: Sclerae unicteric, pupils equal, round and reactive to light Ear-nose-throat: Oropharynx clear, dentition fair Lymphatic: No cervical or supraclavicular adenopathy Lungs no rales or rhonchi, good excursion bilaterally Heart regular rate and rhythm, no murmur appreciated Abd soft, nontender, positive bowel sounds MSK no focal spinal tenderness, no joint edema Neuro:  non-focal, well-oriented, appropriate affect Breasts: Deferred   Lab Results  Component Value Date   WBC 4.3 05/14/2021   HGB 10.6 (L) 05/14/2021   HCT 32.1 (L) 05/14/2021   MCV 84.7 05/14/2021   PLT 177 05/14/2021   Lab Results  Component Value Date   FERRITIN 140 05/14/2021   IRON 100 05/14/2021   TIBC 241 05/14/2021   UIBC 141 05/14/2021   IRONPCTSAT 41 05/14/2021   Lab Results  Component Value Date   RBC 3.79 (L) 05/14/2021   No results found for: KPAFRELGTCHN, LAMBDASER, [facility] Lab Results  Component Value Date   IGGSERUM 2,209 (H) 05/08/2020   IGMSERUM 195 05/08/2020   No results found for: Odetta Pink, SPEI   Chemistry      Component Value Date/Time   NA 136 05/14/2021 1042   NA 140 04/19/2020 1458   K 4.2 05/14/2021 1042   CL 103 05/14/2021 1042   CO2 28 05/14/2021 1042   BUN 12 05/14/2021 1042   BUN 11 04/19/2020 1458   CREATININE 0.88 05/14/2021 1042      Component Value Date/Time   CALCIUM 8.8 (L) 05/14/2021 1042   ALKPHOS 164 (H) 05/14/2021 1042   AST 57 (H) 05/14/2021 1042   ALT 34 05/14/2021 1042   BILITOT 2.3 (H) 05/14/2021 1042       Impression and Plan: Darryl Velazquez is a very pleasant 61 yo caucasian gentleman with cirrhosis and hemochromatosis, heterozygous for the H63D mutations.  Iron studies are pending. He will donate with [facility] as needed.  Lab check in 6 weeks and follow-up in 3 months.   Lottie Dawson, NP 1/25/20231:23 PM

## 2021-07-03 LAB — IRON AND IRON BINDING CAPACITY (CC-WL,HP ONLY)
Iron: 68 ug/dL (ref 45–182)
Saturation Ratios: 23 % (ref 17.9–39.5)
TIBC: 294 ug/dL (ref 250–450)
UIBC: 226 ug/dL (ref 117–376)

## 2021-07-03 LAB — FERRITIN: Ferritin: 97 ng/mL (ref 24–336)

## 2021-07-07 ENCOUNTER — Ambulatory Visit (HOSPITAL_BASED_OUTPATIENT_CLINIC_OR_DEPARTMENT_OTHER)
Admission: RE | Admit: 2021-07-07 | Discharge: 2021-07-07 | Disposition: A | Discharge status: Home / Self Care | Payer: BC Managed Care – PPO | Source: Ambulatory Visit | Attending: Gastroenterology | Admitting: Gastroenterology

## 2021-07-07 ENCOUNTER — Other Ambulatory Visit: Payer: Self-pay

## 2021-07-07 DIAGNOSIS — K7460 Unspecified cirrhosis of liver: Secondary | ICD-10-CM | POA: Insufficient documentation

## 2021-07-07 DIAGNOSIS — K7581 Nonalcoholic steatohepatitis (NASH): Secondary | ICD-10-CM | POA: Diagnosis not present

## 2021-07-07 DIAGNOSIS — R188 Other ascites: Secondary | ICD-10-CM | POA: Diagnosis not present

## 2021-07-07 IMAGING — US US ABDOMEN LIMITED
1 series · 14 of 25 positions shown · non-contrast
Comparison: MRI abdomen [DATE]. MRI abdomen [DATE]. CT scan [DATE].

CLINICAL DATA: Cirrhosis.

EXAM:
ULTRASOUND ABDOMEN LIMITED RIGHT UPPER QUADRANT

[Series 1: us abdomen limited · 14 of 61 slices shown]
[im 1/61]
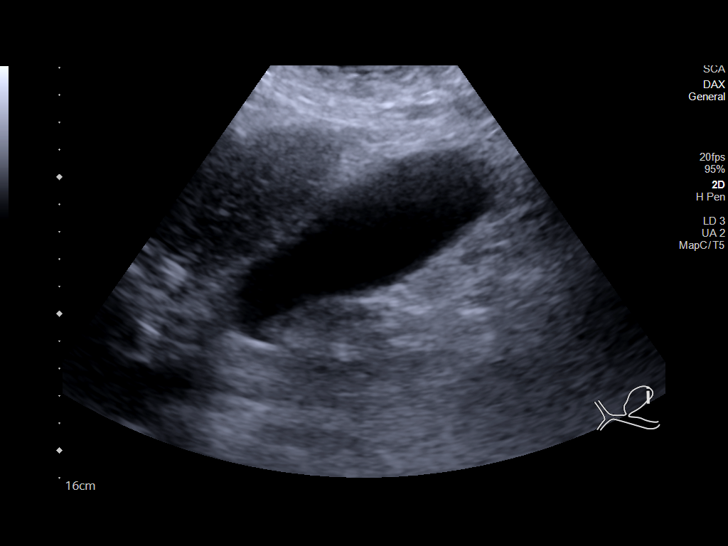
[im 6/61]
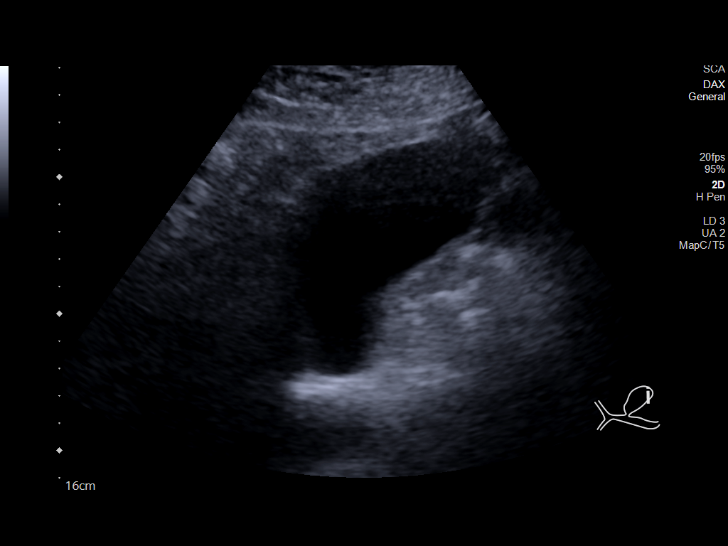
[im 11/61]
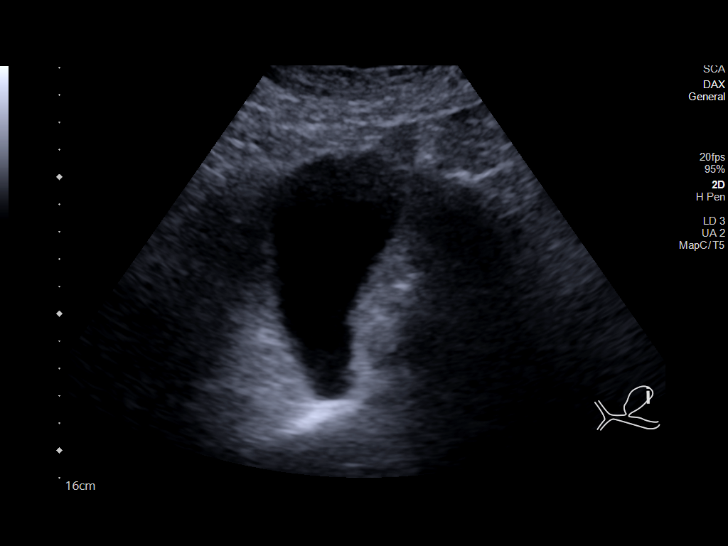
[im 16/61]
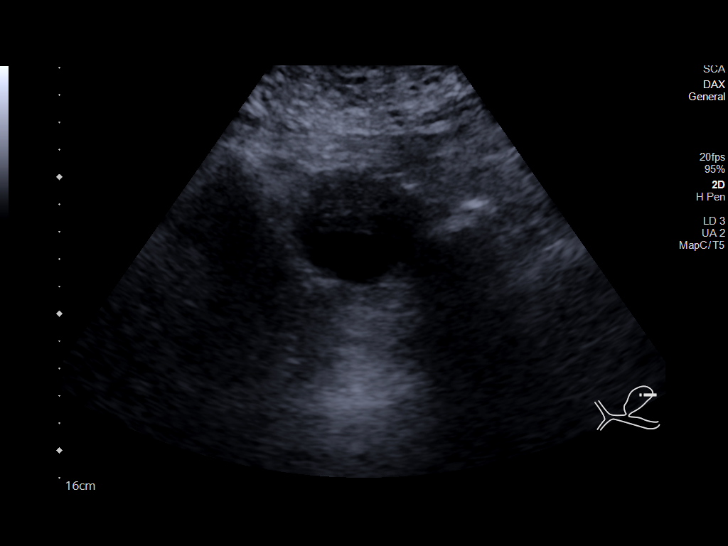
[im 21/61]
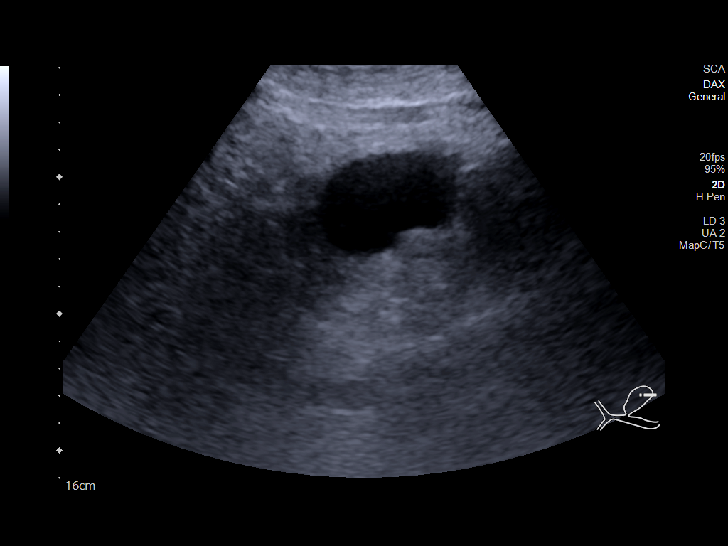
[im 23/61]
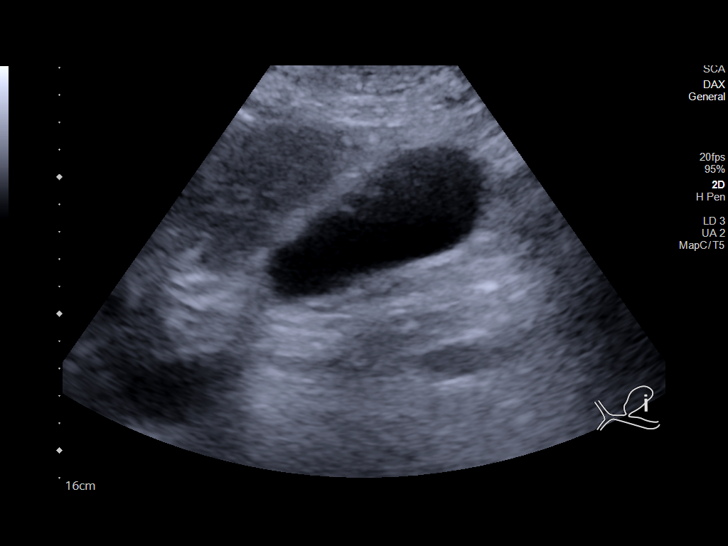
[im 28/61]
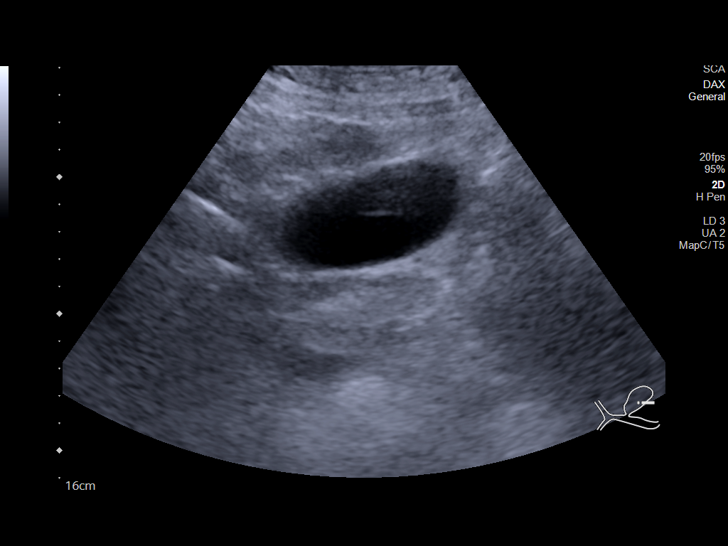
[im 33/61]
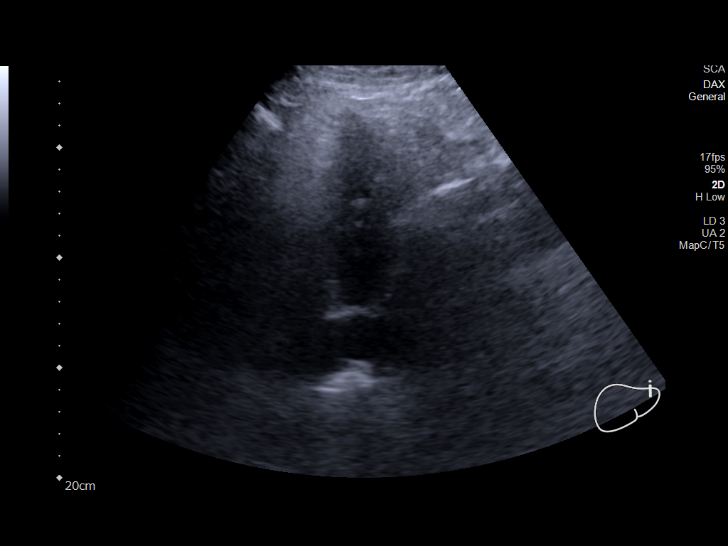
[im 38/61]
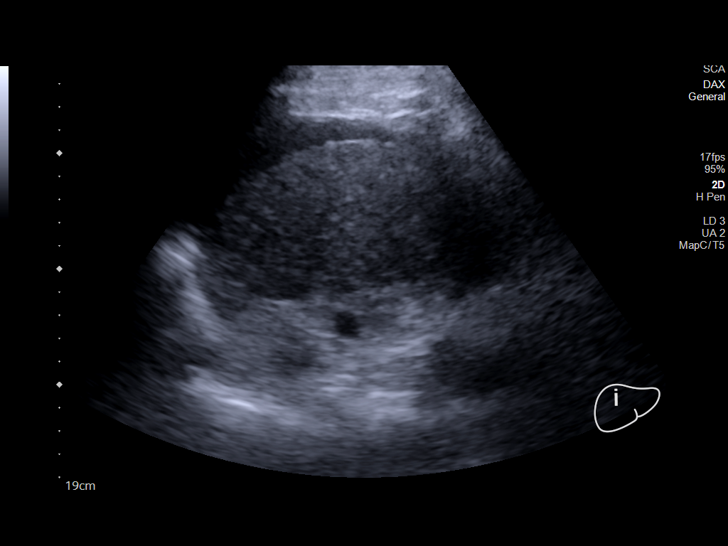
[im 41/61]
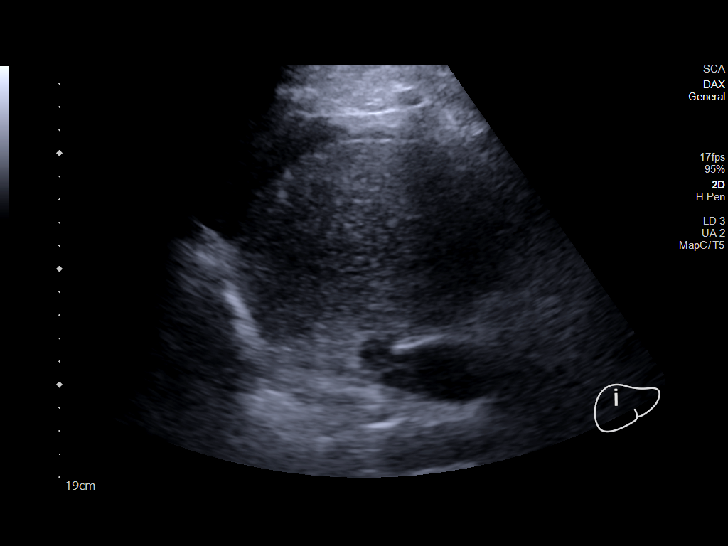
[im 46/61]
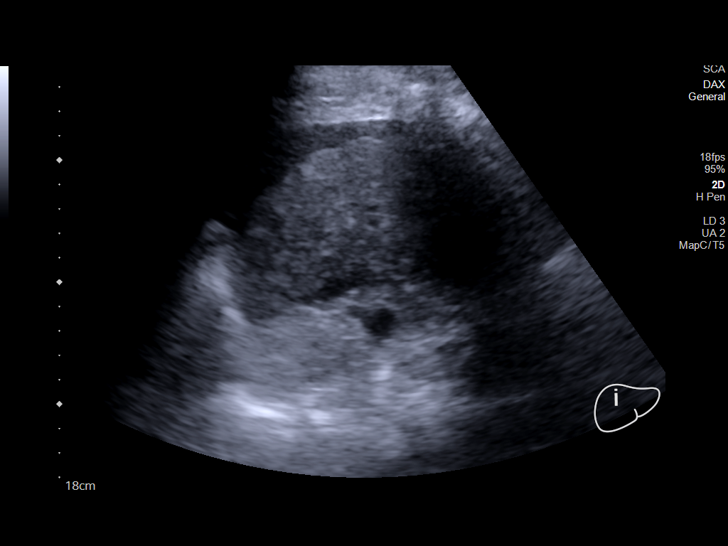
[im 51/61]
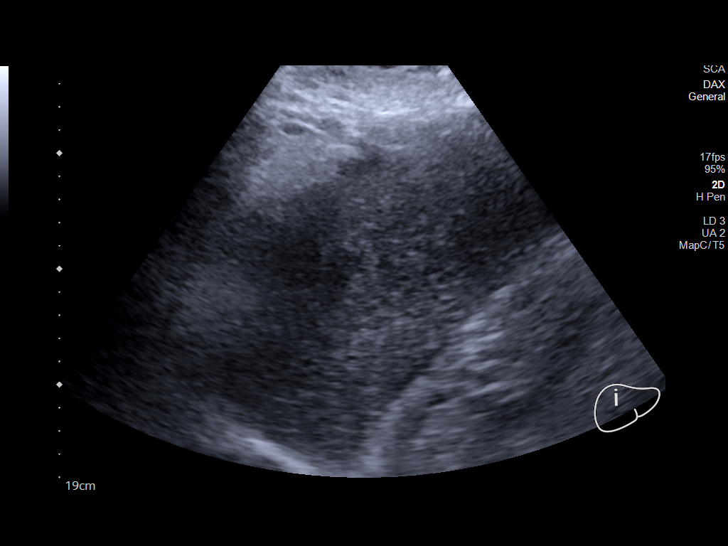
[im 56/61]
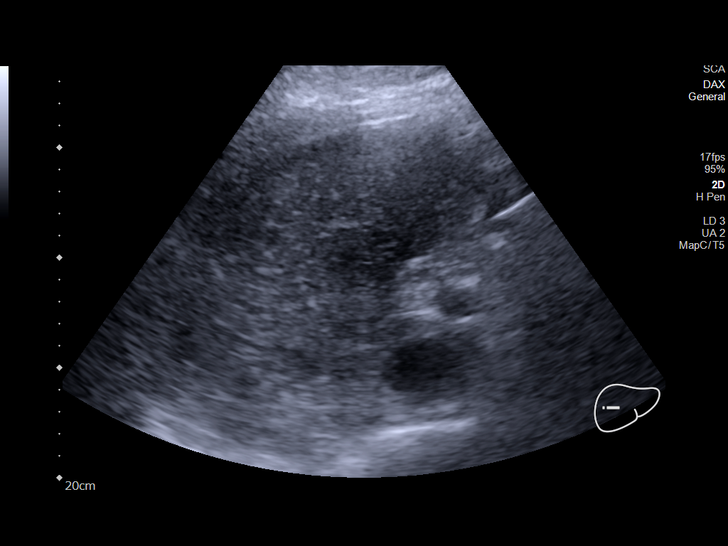
[im 61/61]
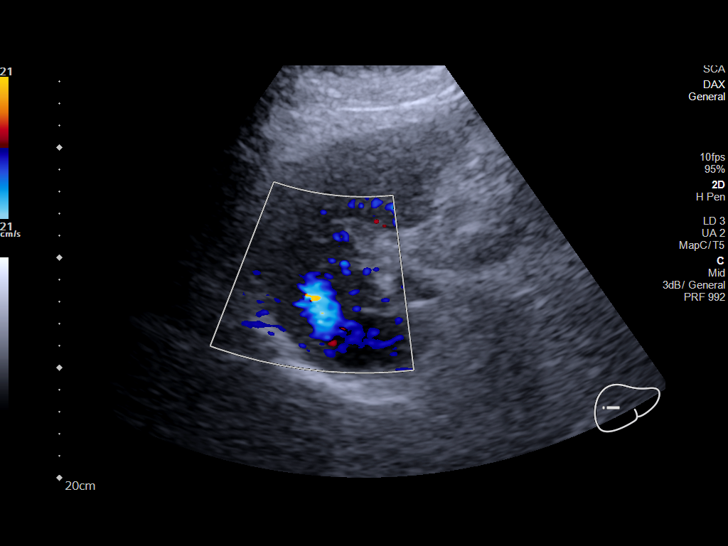

[14 of 25 positions shown; findings below may reference images not displayed]

FINDINGS: Gallbladder:

No gallstones or wall thickening visualized. No sonographic Murphy
sign noted by sonographer.

Common bile duct:

Diameter: 4.7 mm

Liver:

The liver demonstrates a nodular contour with no focal mass
identified. Increased heterogeneous echogenicity is identified.
Evaluation of blood flow in the portal vein is markedly limited.

Other: Mild ascites.
IMPRESSION: 1. Cirrhotic liver.
2. Evaluation of blood flow in the portal vein is markedly limited
on today's study.
3. Mild ascites.

## 2021-07-16 DIAGNOSIS — R188 Other ascites: Secondary | ICD-10-CM | POA: Diagnosis not present

## 2021-07-16 DIAGNOSIS — K7469 Other cirrhosis of liver: Secondary | ICD-10-CM | POA: Diagnosis not present

## 2021-07-16 DIAGNOSIS — Z9114 Patient's other noncompliance with medication regimen: Secondary | ICD-10-CM | POA: Diagnosis not present

## 2021-07-16 DIAGNOSIS — K7682 Hepatic encephalopathy: Secondary | ICD-10-CM | POA: Diagnosis not present

## 2021-07-21 ENCOUNTER — Telehealth: Payer: Self-pay

## 2021-07-21 NOTE — Telephone Encounter (Signed)
This RN received message that patient did not read latest MyChart message. This RN called patient to follow up. Patient was still sleeping and wife very informative stating Darryl Velazquez had not donated at [facility] or read message but she would make sure he got there this week. Wife had no further questions and appreciative of call.

## 2021-08-07 ENCOUNTER — Other Ambulatory Visit: Payer: Self-pay

## 2021-08-07 ENCOUNTER — Telehealth: Payer: Self-pay

## 2021-08-07 ENCOUNTER — Encounter: Payer: Self-pay | Admitting: Gastroenterology

## 2021-08-07 DIAGNOSIS — K7581 Nonalcoholic steatohepatitis (NASH): Secondary | ICD-10-CM

## 2021-08-07 DIAGNOSIS — K7460 Unspecified cirrhosis of liver: Secondary | ICD-10-CM

## 2021-08-07 DIAGNOSIS — R252 Cramp and spasm: Secondary | ICD-10-CM

## 2021-08-07 NOTE — Telephone Encounter (Signed)
Please order a BMP and magnesium level under Dr. Vivia Ewing name. ? ?Dr. Bryan Lemma knows this patient well and can give further advice on this chronic issue when he returns to the office tomorrow. ? ?HD ?

## 2021-08-07 NOTE — Telephone Encounter (Deleted)
Dr. Vivia Ewing pt. As DOD please advise.  ?

## 2021-08-07 NOTE — Telephone Encounter (Signed)
[name] as DOD please see mychart message that was sent by pt. This is Dr. Bryan Lemma pt with a history of Liver cirrhosis secondary to NASH ([place]), Encephalopathy, hepatic, Cirrhosis of liver without ascites, unspecified hepatic cirrhosis type ([place]), Epistaxis, Nausea and vomiting, unspecified vomiting type, Hereditary hemochromatosis ([place]). ? ?"Darryl Velazquez is having severe issues with his hands, legs and feet cramping at the end of the day and it seems to occur on the days that he takes Lasix.  The pain is excruciating and he can do nothing but lay completely still. Any movement causes the cramping.  We are wondering if he can discontinue the Lasix and either try something different or double up on the Aldactone?  He has tried to keep fluids in to offset the cramping but it's not working.   Thanks.[name]" ?

## 2021-08-07 NOTE — Telephone Encounter (Signed)
Placed order for BMP and magnesium level. Called pt's wife and let her know about labs. Pt's wife verbalized understanding and had no other concerns at end of call.  ?

## 2021-08-08 ENCOUNTER — Other Ambulatory Visit (INDEPENDENT_AMBULATORY_CARE_PROVIDER_SITE_OTHER): Payer: BC Managed Care – PPO

## 2021-08-08 DIAGNOSIS — K7581 Nonalcoholic steatohepatitis (NASH): Secondary | ICD-10-CM

## 2021-08-08 DIAGNOSIS — K7460 Unspecified cirrhosis of liver: Secondary | ICD-10-CM | POA: Diagnosis not present

## 2021-08-08 DIAGNOSIS — R252 Cramp and spasm: Secondary | ICD-10-CM | POA: Diagnosis not present

## 2021-08-08 LAB — BASIC METABOLIC PANEL
BUN: 16 mg/dL (ref 6–23)
CO2: 25 mEq/L (ref 19–32)
Calcium: 8.7 mg/dL (ref 8.4–10.5)
Chloride: 98 mEq/L (ref 96–112)
Creatinine, Ser: 0.89 mg/dL (ref 0.40–1.50)
GFR: 93.18 mL/min (ref >60.00)
Glucose, Bld: 157 mg/dL — ABNORMAL HIGH (ref 70–99)
Potassium: 4.2 mEq/L (ref 3.5–5.1)
Sodium: 131 mEq/L — ABNORMAL LOW (ref 135–145)

## 2021-08-08 LAB — MAGNESIUM: Magnesium: 1.9 mg/dL (ref 1.5–2.5)

## 2021-08-08 NOTE — Telephone Encounter (Signed)
Spoke with pt's wife and scheduled pt for an appt with Dr. Bryan Lemma on 08/14/21 at 3 pm. Let wife know that Dr. Bryan Lemma also recommended that pt follow up with his PCP.  Pt's wife verbalized understanding and stated pt would be getting labs done today.  ?

## 2021-08-14 ENCOUNTER — Ambulatory Visit: Payer: BC Managed Care – PPO | Admitting: Gastroenterology

## 2021-08-14 ENCOUNTER — Encounter: Payer: Self-pay | Admitting: Gastroenterology

## 2021-08-14 DIAGNOSIS — C61 Malignant neoplasm of prostate: Secondary | ICD-10-CM | POA: Diagnosis not present

## 2021-08-14 DIAGNOSIS — K4090 Unilateral inguinal hernia, without obstruction or gangrene, not specified as recurrent: Secondary | ICD-10-CM | POA: Diagnosis not present

## 2021-08-14 NOTE — Telephone Encounter (Signed)
Spoke with pt. Pt states he is still having severe cramping in his hands, legs, and feet and now swelling in his groin area. Scheduled pt for f/u on Tuesday 08/19/21 at 2:40 pm with Dr. Bryan Lemma. Pt verbalized understanding and had no other concerns.  ?

## 2021-08-18 ENCOUNTER — Emergency Department (HOSPITAL_COMMUNITY)
Admission: EM | Admit: 2021-08-18 | Discharge: 2021-08-18 | Disposition: A | Discharge status: Home / Self Care | Payer: BC Managed Care – PPO | Attending: Emergency Medicine | Admitting: Emergency Medicine

## 2021-08-18 ENCOUNTER — Emergency Department (HOSPITAL_COMMUNITY): Payer: BC Managed Care – PPO

## 2021-08-18 ENCOUNTER — Encounter (HOSPITAL_COMMUNITY): Payer: Self-pay | Admitting: Radiology

## 2021-08-18 ENCOUNTER — Other Ambulatory Visit: Payer: Self-pay

## 2021-08-18 DIAGNOSIS — Z7982 Long term (current) use of aspirin: Secondary | ICD-10-CM | POA: Diagnosis not present

## 2021-08-18 DIAGNOSIS — K4090 Unilateral inguinal hernia, without obstruction or gangrene, not specified as recurrent: Secondary | ICD-10-CM | POA: Insufficient documentation

## 2021-08-18 DIAGNOSIS — R188 Other ascites: Secondary | ICD-10-CM | POA: Diagnosis not present

## 2021-08-18 DIAGNOSIS — K7460 Unspecified cirrhosis of liver: Secondary | ICD-10-CM | POA: Diagnosis not present

## 2021-08-18 DIAGNOSIS — R109 Unspecified abdominal pain: Secondary | ICD-10-CM | POA: Diagnosis not present

## 2021-08-18 DIAGNOSIS — R1900 Intra-abdominal and pelvic swelling, mass and lump, unspecified site: Secondary | ICD-10-CM | POA: Diagnosis not present

## 2021-08-18 DIAGNOSIS — Z8546 Personal history of malignant neoplasm of prostate: Secondary | ICD-10-CM | POA: Diagnosis not present

## 2021-08-18 LAB — CBC WITH DIFFERENTIAL/PLATELET
Abs Immature Granulocytes: 0 10*3/uL (ref 0.00–0.07)
Basophils Absolute: 0.1 10*3/uL (ref 0.0–0.1)
Basophils Relative: 1 %
Eosinophils Absolute: 0.2 10*3/uL (ref 0.0–0.5)
Eosinophils Relative: 2 %
HCT: 34.5 % — ABNORMAL LOW (ref 39.0–52.0)
Hemoglobin: 12 g/dL — ABNORMAL LOW (ref 13.0–17.0)
Lymphocytes Relative: 6 %
Lymphs Abs: 0.5 10*3/uL — ABNORMAL LOW (ref 0.7–4.0)
MCH: 29.2 pg (ref 26.0–34.0)
MCHC: 34.8 g/dL (ref 30.0–36.0)
MCV: 83.9 fL (ref 80.0–100.0)
Monocytes Absolute: 1.1 10*3/uL — ABNORMAL HIGH (ref 0.1–1.0)
Monocytes Relative: 14 %
Neutro Abs: 6.3 10*3/uL (ref 1.7–7.7)
Neutrophils Relative %: 77 %
Platelets: 195 10*3/uL (ref 150–400)
RBC: 4.11 MIL/uL — ABNORMAL LOW (ref 4.22–5.81)
RDW: 22.2 % — ABNORMAL HIGH (ref 11.5–15.5)
WBC: 8.2 10*3/uL (ref 4.0–10.5)
nRBC: 0 % (ref 0.0–0.2)
nRBC: 0 /100 WBC

## 2021-08-18 LAB — URINALYSIS, ROUTINE W REFLEX MICROSCOPIC
Bilirubin Urine: NEGATIVE
Glucose, UA: NEGATIVE mg/dL
Hgb urine dipstick: NEGATIVE
Ketones, ur: NEGATIVE mg/dL
Leukocytes,Ua: NEGATIVE
Nitrite: NEGATIVE
Protein, ur: NEGATIVE mg/dL
Specific Gravity, Urine: 1.028 (ref 1.005–1.030)
pH: 5 (ref 5.0–8.0)

## 2021-08-18 LAB — COMPREHENSIVE METABOLIC PANEL
ALT: 54 U/L — ABNORMAL HIGH (ref 0–44)
AST: 75 U/L — ABNORMAL HIGH (ref 15–41)
Albumin: 2.5 g/dL — ABNORMAL LOW (ref 3.5–5.0)
Alkaline Phosphatase: 108 U/L (ref 38–126)
Anion gap: 7 (ref 5–15)
BUN: 9 mg/dL (ref 6–20)
CO2: 24 mmol/L (ref 22–32)
Calcium: 8.5 mg/dL — ABNORMAL LOW (ref 8.9–10.3)
Chloride: 102 mmol/L (ref 98–111)
Creatinine, Ser: 0.97 mg/dL (ref 0.61–1.24)
GFR, Estimated: >60 mL/min (ref >60)
Glucose, Bld: 117 mg/dL — ABNORMAL HIGH (ref 70–99)
Potassium: 3.8 mmol/L (ref 3.5–5.1)
Sodium: 133 mmol/L — ABNORMAL LOW (ref 135–145)
Total Bilirubin: 5 mg/dL — ABNORMAL HIGH (ref 0.3–1.2)
Total Protein: 6.7 g/dL (ref 6.5–8.1)

## 2021-08-18 LAB — LIPASE, BLOOD: Lipase: 55 U/L — ABNORMAL HIGH (ref 11–51)

## 2021-08-18 IMAGING — CT CT ABD-PELV W/ CM
2 of 5 series · 16 of 46 positions shown, 18 images · IV contrast (APPLIED)
Comparison: MRI abdomen dated [DATE]. CT abdomen pelvis
dated [DATE].

CLINICAL DATA: Right testicular pain and swelling for the past 2
months.

EXAM:
CT ABDOMEN AND PELVIS WITH CONTRAST
TECHNIQUE: Multidetector CT imaging of the abdomen and pelvis was performed
using the standard protocol following bolus administration of
intravenous contrast.

[Series 3: abdomen 5.0 · axial · 0.96mm/px · z∈[+863,+1353]mm · 13 of 114 slices shown, 15 images]
[im 8/114  soft-tissue]
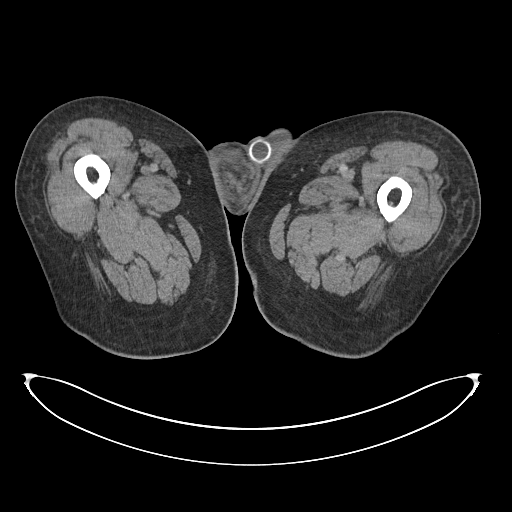
[im 8/114  bone]
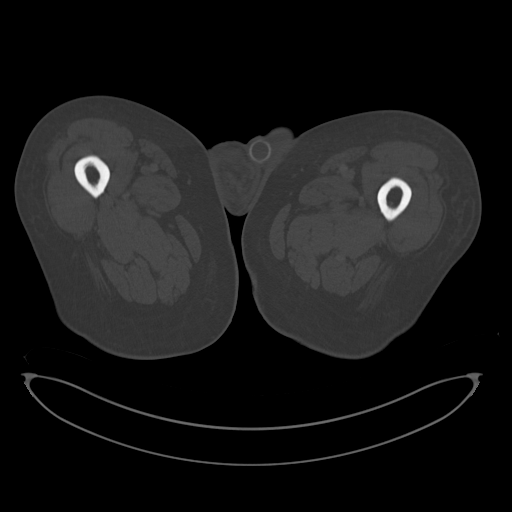
[im 15/114  soft-tissue]
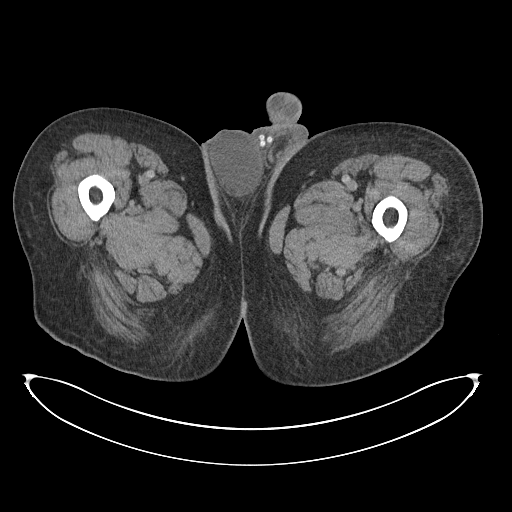
[im 22/114  soft-tissue]
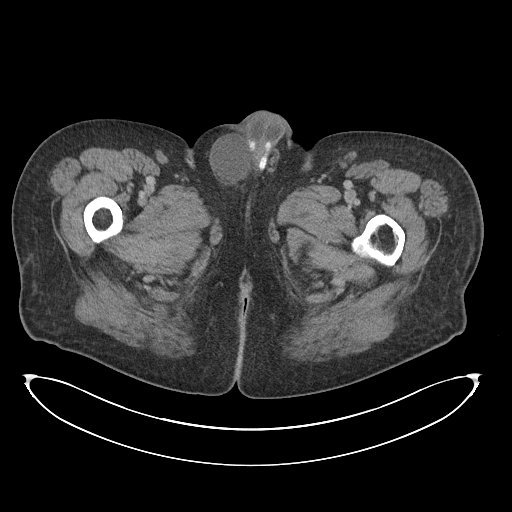
[im 36/114  soft-tissue]
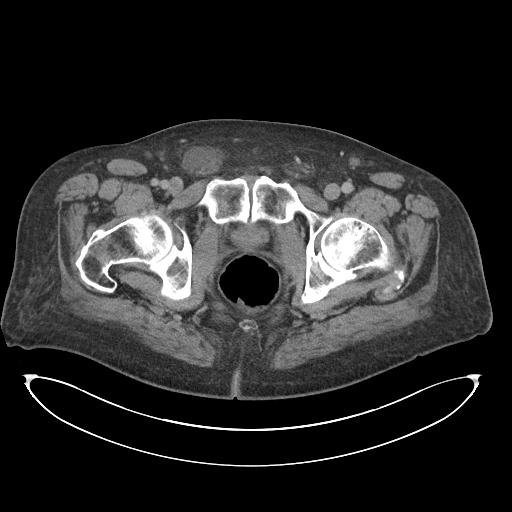
[im 43/114  soft-tissue]
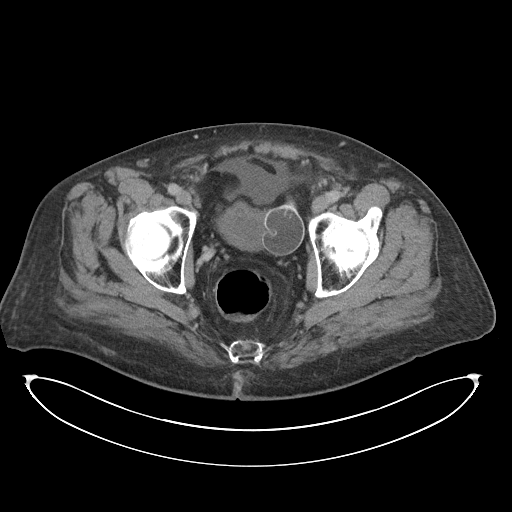
[im 50/114  soft-tissue]
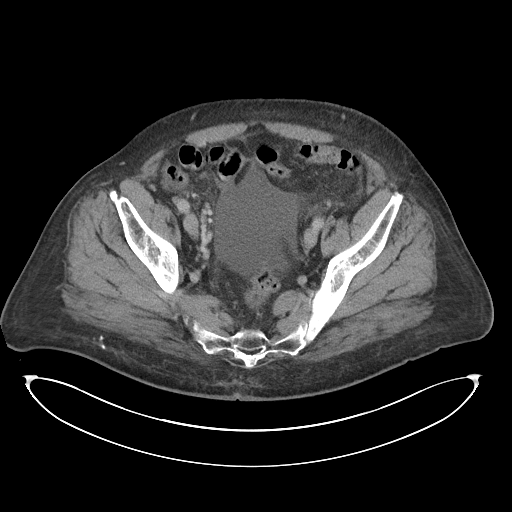
[im 57/114  soft-tissue]
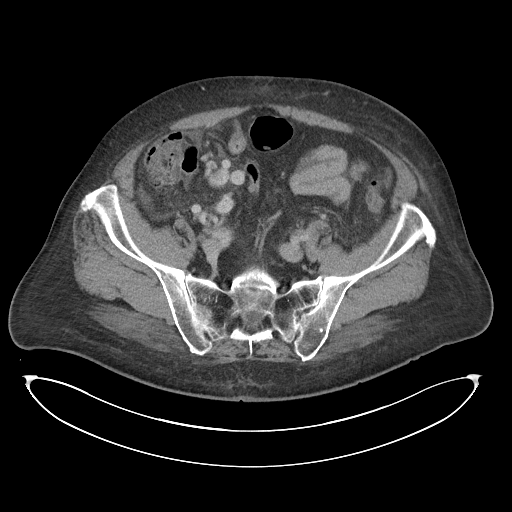
[im 64/114  soft-tissue]
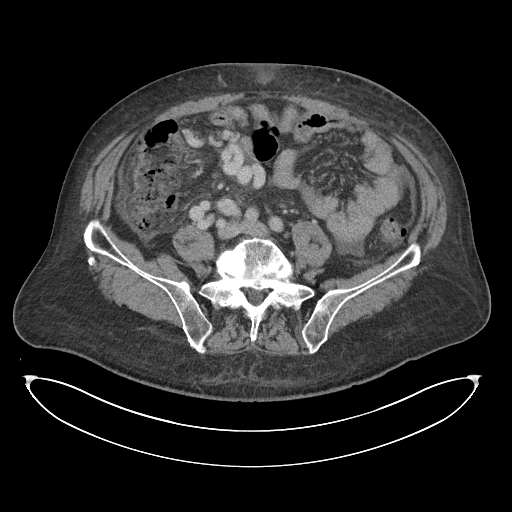
[im 71/114  soft-tissue]
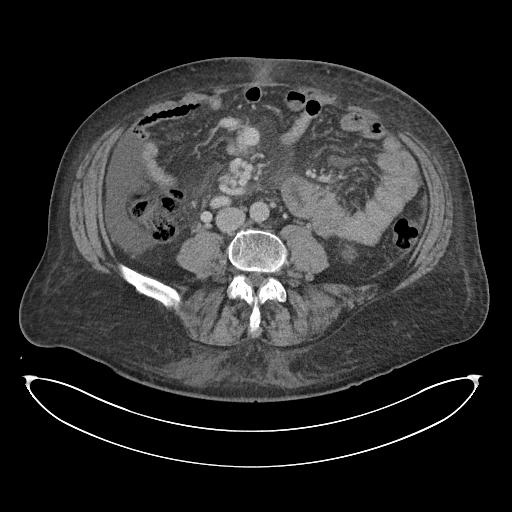
[im 71/114  bone]
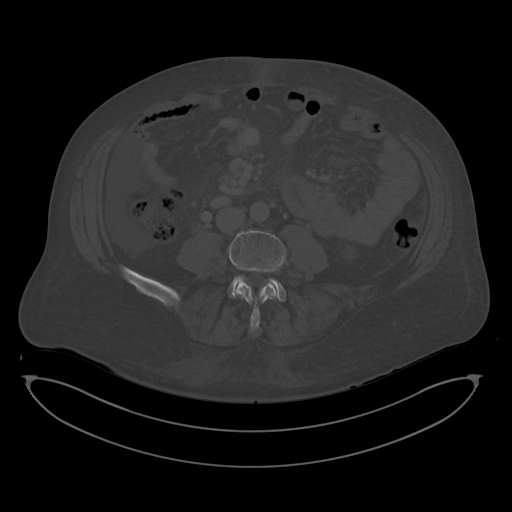
[im 78/114  soft-tissue]
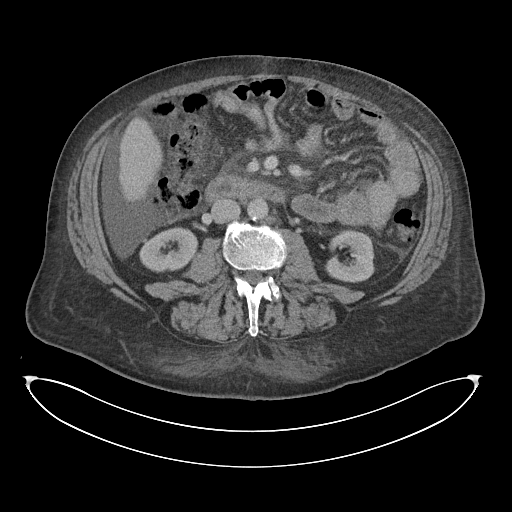
[im 92/114  soft-tissue]
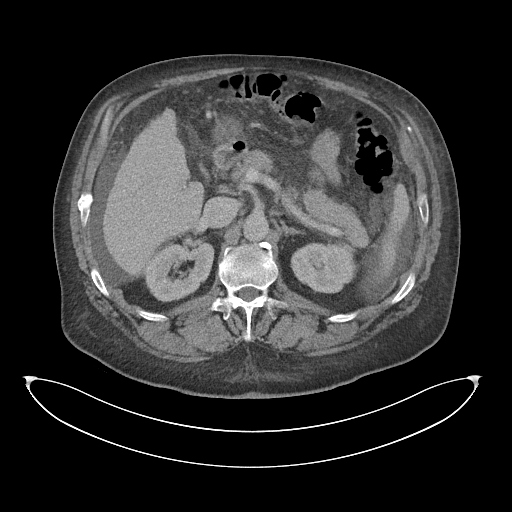
[im 99/114  soft-tissue]
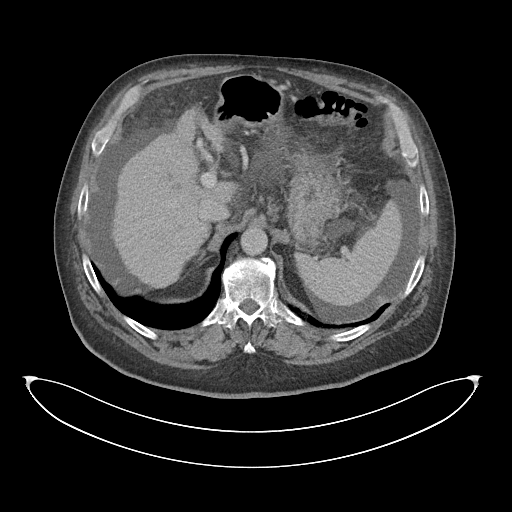
[im 106/114  soft-tissue]
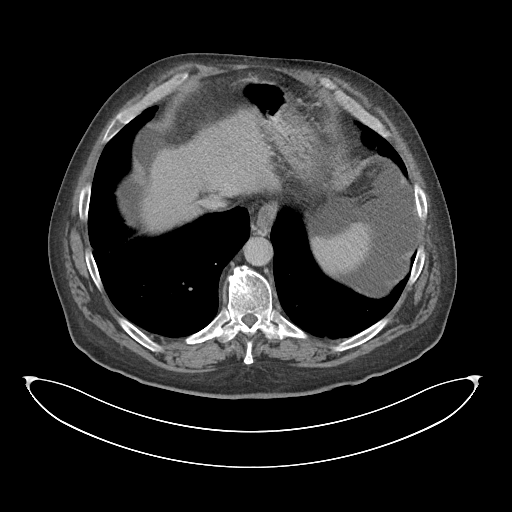

[Series 6: abdomen 3.0 mpr cor · coronal · 0.93mm/px · 3 of 102 slices shown]
[im 34/102  soft-tissue]
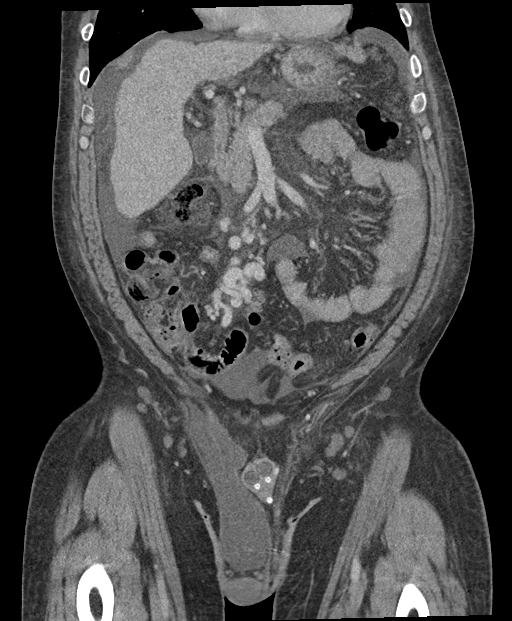
[im 45/102  soft-tissue]
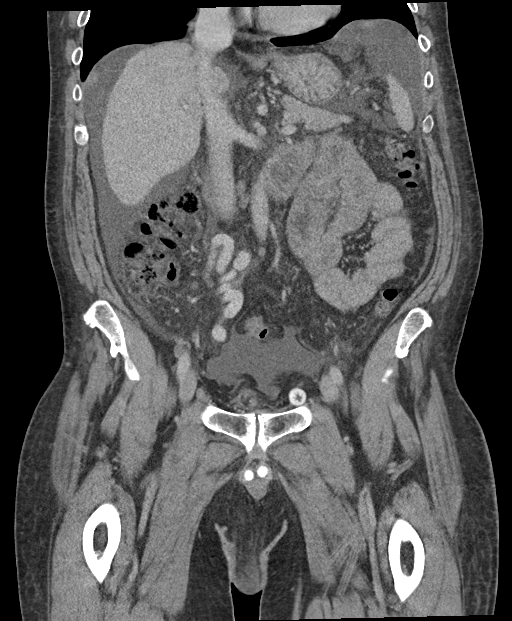
[im 57/102  soft-tissue]
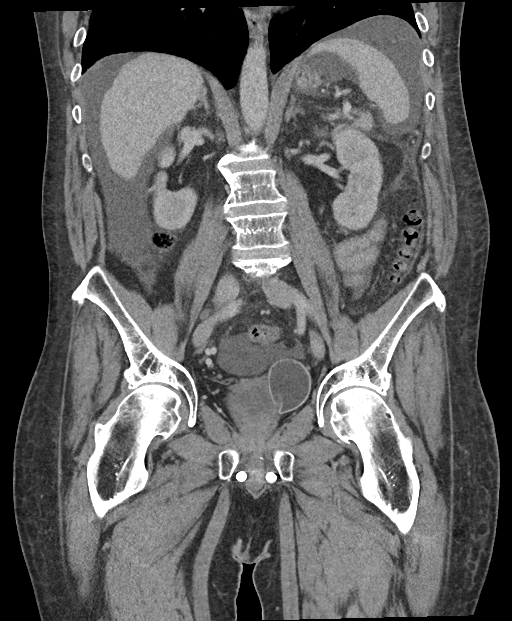

[16 of 46 positions shown; findings below may reference images not displayed]

RADIATION DOSE REDUCTION: This exam was performed according to the
departmental dose-optimization program which includes automated
exposure control, adjustment of the mA and/or kV according to
patient size and/or use of iterative reconstruction technique.

CONTRAST:  100mL OMNIPAQUE IOHEXOL 300 MG/ML  SOLN
FINDINGS: Lower chest: No acute abnormality.

Hepatobiliary: Unchanged small cirrhotic liver. No focal liver
abnormality. The gallbladder is unremarkable. No biliary dilatation.

Pancreas: Unremarkable. No pancreatic ductal dilatation or
surrounding inflammatory changes.

Spleen: Normal in size without focal abnormality.

Adrenals/Urinary Tract: Adrenal glands are unremarkable. Kidneys are
normal, without renal calculi, focal lesion, or hydronephrosis.
Bladder is mostly decompressed.

Stomach/Bowel: Stomach is within normal limits. Normal appendix. No
evidence of bowel wall thickening, distention, or inflammatory
changes.

Vascular/Lymphatic: Large right-sided SMV-IVC shunt again noted. No
enlarged abdominal or pelvic lymph nodes.

Reproductive: Prior prostatectomy.  Penile implant again noted.

Other: Unchanged right inguinal hernia containing ascites. No bowel
within the hernia. Small intra-abdominal ascites, increased from
prior. No pneumoperitoneum.

Musculoskeletal: No acute or significant osseous findings.
IMPRESSION: 1. No acute intra-abdominal process. Unchanged right inguinal hernia
containing ascites.
2. Cirrhosis with sequelae of portal hypertension. Small ascites,
increased from prior.

## 2021-08-18 MED ORDER — ONDANSETRON HCL 4 MG/2ML IJ SOLN
4.0000 mg | Freq: Once | INTRAMUSCULAR | Status: AC
Start: 2021-08-18 — End: 2021-08-18
  Administered 2021-08-18: 4 mg via INTRAVENOUS
  Filled 2021-08-18: qty 2

## 2021-08-18 MED ORDER — FUROSEMIDE 10 MG/ML IJ SOLN
80.0000 mg | Freq: Once | INTRAMUSCULAR | Status: AC
  Administered 2021-08-18: 80 mg via INTRAVENOUS
  Filled 2021-08-18: qty 8

## 2021-08-18 MED ORDER — FENTANYL CITRATE PF 50 MCG/ML IJ SOSY
50.0000 ug | PREFILLED_SYRINGE | Freq: Once | INTRAMUSCULAR | Status: AC
  Administered 2021-08-18: 50 ug via INTRAVENOUS
  Filled 2021-08-18: qty 1

## 2021-08-18 MED ORDER — IOHEXOL 300 MG/ML  SOLN
100.0000 mL | Freq: Once | INTRAMUSCULAR | Status: AC | PRN
  Administered 2021-08-18: 100 mL via INTRAVENOUS

## 2021-08-18 NOTE — ED Triage Notes (Signed)
Pt here for continued R side testicular and groin swelling x2 months. PT has hx of prostate cancer and has been trying to get back in with his urologist, they told him they wouldn't be able to see him until July. Pt states he feels "sick", has nausea, no fevers. ?

## 2021-08-18 NOTE — ED Provider Notes (Signed)
[facility] EMERGENCY DEPARTMENT Provider Note   CSN: [id number] Arrival date & time: 08/18/21  1103     History  Chief Complaint  Patient presents with   Groin Swelling    Darryl Velazquez is a 61 y.o. male.  HPI     61yo male with history of hemochromatosis, cirrhosis secondary to NASH, with ascites, portal hypertensive gastropathy, hepatic encephalopathy, OSA, PVD, prostate cancer, CVA presents with concern for groin swelling and pain.  Reports he has been having right sided groin swelling since December that has been progressively getting worse.  Has significant pain when he walks around in his right testicle.  It is worse with lifting, coughing, laughing.  He notes developing some nausea over the past few weeks. Has not had emesis.  Stools are normal (for taking lactulose)/no constipation. No fever. No cough or congestion.  Has not had urinary symptoms but having frequency at night and was to see urologist with concern for retention.  Had an MRI of his liver done at another facility and was recommended Urology follow up.  He has had muscle cramping and has been talking to doctor about that.   Past Medical History:  Diagnosis Date   Cerebrovascular disease    Chronic venous insufficiency    Dyslipidemia    Hemochromatosis, hereditary (HCC)    Medication intolerance    Morbid obesity (HCC)    Nicotine dependence    Nonalcoholic steatohepatitis    Peripheral vascular disease (HCC)    Pneumothorax, traumatic    Prostate cancer (HCC)    Prostatic adenocarcinoma (HCC)    Sleep apnea    Stroke (HCC)    2 prior to age 62.   Venous stasis dermatitis of both lower extremities      Home Medications Prior to Admission medications   Medication Sig Start Date End Date Taking? Authorizing Provider  aspirin EC 81 MG tablet Take 81 mg by mouth daily. Swallow whole.    [provider]  buPROPion (WELLBUTRIN XL) 150 MG 24 hr tablet Take 150 mg by mouth  every morning. 03/12/21   [provider]  docusate sodium (COLACE) 100 MG capsule Take 1 capsule (100 mg total) by mouth 2 (two) times daily. 12/02/20   Asencion Islam, DPM  furosemide (LASIX) 80 MG tablet TAKE 1 TABLET BY MOUTH EVERY OTHER DAY 02/11/21   Cirigliano, Vito V, DO  Lactulose 20 GM/30ML SOLN Take 45 mLs (30 g total) by mouth in the morning and at bedtime. 10/15/20   Cirigliano, Vito V, DO  ondansetron (ZOFRAN-ODT) 4 MG disintegrating tablet Take 4 mg by mouth every 8 (eight) hours as needed. 03/12/21   [provider]  rifaximin (XIFAXAN) 550 MG TABS tablet Take 1 tablet (550 mg total) by mouth 2 (two) times daily. 03/20/21   Cirigliano, Vito V, DO  spironolactone (ALDACTONE) 100 MG tablet Take 1 tablet (100 mg total) by mouth daily. 08/22/20 12/27/20  Joy, Hillard Danker, PA-C      Allergies    Patient has no known allergies.    Review of Systems   Review of Systems  Physical Exam Updated Vital Signs BP (!) 161/58 (BP Location: Left Arm)   Pulse 70   Temp 98 F (36.7 C) (Oral)   Resp 15   SpO2 100%  Physical Exam Vitals and nursing note reviewed.  Constitutional:      General: He is not in acute distress.    Appearance: He is well-developed. He is not diaphoretic.  HENT:     Head: Normocephalic and atraumatic.  Eyes:     Conjunctiva/sclera: Conjunctivae normal.  Cardiovascular:     Rate and Rhythm: Normal rate and regular rhythm.  Pulmonary:     Effort: Pulmonary effort is normal. No respiratory distress.     Breath sounds: Normal breath sounds.  Abdominal:     General: There is distension.     Palpations: Abdomen is soft.     Tenderness: There is abdominal tenderness (right lower, right inguinal). There is no guarding.     Hernia: A hernia is present.  Genitourinary:    Comments: Swelling right scrotum, hernia, tenderness Musculoskeletal:     Cervical back: Normal range of motion.     Right lower leg: Edema present.     Left lower leg: Edema present.   Skin:    General: Skin is warm and dry.  Neurological:     Mental Status: He is alert and oriented to person, place, and time.    ED Results / Procedures / Treatments   Labs (all labs ordered are listed, but only abnormal results are displayed) Labs Reviewed  URINALYSIS, ROUTINE W REFLEX MICROSCOPIC - Abnormal; Notable for the following components:      Result Value   Color, Urine AMBER (*)    All other components within normal limits  CBC WITH DIFFERENTIAL/PLATELET - Abnormal; Notable for the following components:   RBC 4.11 (*)    Hemoglobin 12.0 (*)    HCT 34.5 (*)    RDW 22.2 (*)    Lymphs Abs 0.5 (*)    Monocytes Absolute 1.1 (*)    All other components within normal limits  COMPREHENSIVE METABOLIC PANEL - Abnormal; Notable for the following components:   Sodium 133 (*)    Glucose, Bld 117 (*)    Calcium 8.5 (*)    Albumin 2.5 (*)    AST 75 (*)    ALT 54 (*)    Total Bilirubin 5.0 (*)    All other components within normal limits  LIPASE, BLOOD - Abnormal; Notable for the following components:   Lipase 55 (*)    All other components within normal limits    EKG None  Radiology CT ABDOMEN PELVIS W CONTRAST  Result Date: 08/18/2021 CLINICAL DATA:  Right testicular pain and swelling for the past 2 months. EXAM: CT ABDOMEN AND PELVIS WITH CONTRAST TECHNIQUE: Multidetector CT imaging of the abdomen and pelvis was performed using the standard protocol following bolus administration of intravenous contrast. RADIATION DOSE REDUCTION: This exam was performed according to the departmental dose-optimization program which includes automated exposure control, adjustment of the mA and/or kV according to patient size and/or use of iterative reconstruction technique. CONTRAST:  OMNIPAQUE IOHEXOL 300 MG/ML  SOLN COMPARISON:  MRI abdomen dated June 20, 2021. CT abdomen pelvis dated June 01, 2021. FINDINGS: Lower chest: No acute abnormality. Hepatobiliary: Unchanged small  cirrhotic liver. No focal liver abnormality. The gallbladder is unremarkable. No biliary dilatation. Pancreas: Unremarkable. No pancreatic ductal dilatation or surrounding inflammatory changes. Spleen: Normal in size without focal abnormality. Adrenals/Urinary Tract: Adrenal glands are unremarkable. Kidneys are normal, without renal calculi, focal lesion, or hydronephrosis. Bladder is mostly decompressed. Stomach/Bowel: Stomach is within normal limits. Normal appendix. No evidence of bowel wall thickening, distention, or inflammatory changes. Vascular/Lymphatic: Large right-sided SMV-IVC shunt again noted. No enlarged abdominal or pelvic lymph nodes. Reproductive: Prior prostatectomy.  Penile implant again noted. Other: Unchanged right inguinal hernia containing ascites. No bowel within the hernia.  Small intra-abdominal ascites, increased from prior. No pneumoperitoneum. Musculoskeletal: No acute or significant osseous findings. IMPRESSION: 1. No acute intra-abdominal process. Unchanged right inguinal hernia containing ascites. 2. Cirrhosis with sequelae of portal hypertension. Small ascites, increased from prior. Electronically Signed   By: Obie Dredge M.D.   On: 08/18/2021 14:44    Procedures Procedures    Medications Ordered in ED Medications  ondansetron (ZOFRAN) injection 4 mg (4 mg Intravenous Given 08/18/21 1342)  fentaNYL (SUBLIMAZE) injection 50 mcg (50 mcg Intravenous Given 08/18/21 1409)  iohexol (OMNIPAQUE) 300 MG/ML solution 100 mL (100 mLs Intravenous Contrast Given 08/18/21 1428)  furosemide (LASIX) injection 80 mg (80 mg Intravenous Given 08/18/21 1601)    ED Course/ Medical Decision Making/ A&P                           Medical Decision Making Amount and/or Complexity of Data Reviewed Labs: ordered. Radiology: ordered.  Risk Prescription drug management.    61yo male with history of hemochromatosis, cirrhosis secondary to NASH, with ascites, portal hypertensive  gastropathy, hepatic encephalopathy, OSA, PVD, prostate cancer, CVA presents with concern for groin swelling and pain.  History and exam with months of swelling not consistent with epididymitis, testicular torsion, abscess. Doubt SBP given no fever, no leukocytosis. Exam most consistent with hernia. CT abdomen pelvis completed and shows right inguinal hernia containing ascites, cirrhosis.    Suspect pain related to inguinal hernia and ascites.  Reports worsening swelling despite taking lasix, given 1 dose of 80mg  in ED, recommend follow up with GI as scheduled tomorrow.  Given number for surgery for follow up, may also follow up with Urology as discussed.          Final Clinical Impression(s) / ED Diagnoses Final diagnoses:  Right inguinal hernia  Other ascites  Cirrhosis of liver with ascites, unspecified hepatic cirrhosis type [facility])    Rx / DC Orders ED Discharge Orders     None         Alvira Monday, MD 08/18/21 2125

## 2021-08-18 NOTE — ED Provider Triage Note (Signed)
Emergency Medicine Provider Triage Evaluation Note ? ?Darryl Velazquez , a 61 y.o. male  was evaluated in triage.  Pt complains of right-sided testicular pain and swelling for the past few months.  Is unable to get in with specialist until this summer.  Reports a history of prostate cancer.  Denies any urinary symptoms.  Has a history of liver cirrhosis and he says he feels as though he is retaining fluid.  No problems with his penile implant ? ?Review of Systems  ?As above ? ?Physical Exam  ?BP (!) 165/68 (BP Location: Right Arm)   Pulse 76   Temp 98 ?F (36.7 ?C) (Oral)   Resp 16   SpO2 97%  ?Gen:   Awake, no distress   ?Resp:  Normal effort  ?MSK:   Moves extremities without difficulty  ?Other:  Large amounts of inflammation in the right testicle.  No superimposing cellulitis.  Nonreducible ? ?Medical Decision Making  ?Medically screening exam initiated at 11:26 AM.  Appropriate orders placed.  Darryl Velazquez was informed that the remainder of the evaluation will be completed by another provider, this initial triage assessment does not replace that evaluation, and the importance of remaining in the ED until their evaluation is complete. ? ? ?  ?Rhae Hammock, PA-C ?08/18/21 1127 ? ?

## 2021-08-18 NOTE — ED Notes (Signed)
Patient transported to CT 

## 2021-08-19 ENCOUNTER — Encounter: Payer: Self-pay | Admitting: Gastroenterology

## 2021-08-19 ENCOUNTER — Ambulatory Visit (INDEPENDENT_AMBULATORY_CARE_PROVIDER_SITE_OTHER): Payer: BC Managed Care – PPO | Admitting: Gastroenterology

## 2021-08-19 VITALS — BP 152/66 | HR 66 | Ht 76.0 in | Wt 281.1 lb

## 2021-08-19 DIAGNOSIS — R252 Cramp and spasm: Secondary | ICD-10-CM | POA: Diagnosis not present

## 2021-08-19 DIAGNOSIS — Z86010 Personal history of colonic polyps: Secondary | ICD-10-CM

## 2021-08-19 DIAGNOSIS — R600 Localized edema: Secondary | ICD-10-CM

## 2021-08-19 DIAGNOSIS — K3189 Other diseases of stomach and duodenum: Secondary | ICD-10-CM

## 2021-08-19 DIAGNOSIS — K7460 Unspecified cirrhosis of liver: Secondary | ICD-10-CM

## 2021-08-19 DIAGNOSIS — K7581 Nonalcoholic steatohepatitis (NASH): Secondary | ICD-10-CM | POA: Diagnosis not present

## 2021-08-19 DIAGNOSIS — K766 Portal hypertension: Secondary | ICD-10-CM | POA: Diagnosis not present

## 2021-08-19 DIAGNOSIS — K4090 Unilateral inguinal hernia, without obstruction or gangrene, not specified as recurrent: Secondary | ICD-10-CM

## 2021-08-19 DIAGNOSIS — K7682 Hepatic encephalopathy: Secondary | ICD-10-CM

## 2021-08-19 NOTE — Progress Notes (Signed)
? ?Chief Complaint:    Cramping in legs, feet, hands; cirrhosis ? ?GI History: 61 year old male with a history of prostate cancer (surgery only), history of CVA (1st at age 39), obesity (BMI 35),  hereditary hemochromatosis, dyslipidemia, peripheral vascular disease, sleep apnea, follows in the GI clinic for decompensated Nash cirrhosis. ?  ?-Appears he has had mildly elevated liver enzymes since 03/2016 (AST/ALT 56/67) with normal T bili 0.6 at that time. ?-04/2020: Evaluated by Dr. [name] in Cardiology clinic for new onset LE edema.  TTE unremarkable.  Started on diuretics. ?-Follows in the Hematology clinic for hemochromatosis with heterozygous H63D mutation.  Started weekly phlebotomy in 06/2020. ?  ?Cirrhosis Evaluation: ?- Etiology: NASH.  Hemochromatosis not felt to be contributory ?- Complications: Radiographic ascites, portal hypertensive gastropathy, lower extremity edema, hepatic encephalopathy ?- HCC screening: UTD.  Elevated AFP, but MRI/MRCP 11/2020 without [place] ?- Variceal screening: EGD 10/2020.  No EV ?- Serologic evaluation: Completed 05/2020. Hemochromatosis DNA positive for 1 H63D (carrier of hemochromatosis). -Elevated IgA (856), elevated IgG (2209), elevated/positive antimitochondrial antibody (43.2), and elevated AFP (12.7). Ferritin 419 (previously 968), iron saturation at 62.5% . Normal/negative ANA, ASMA, ceruloplasmin, viral hepatitis panel, IgM, HIV ?- Viral hepatitis vaccination: Negative HBsAb, HAVAb.  Completed vaccine series ?- Flu vaccine: UTD ?- Liver biopsy: 05/16/2020: Chronic minimally active hepatitis with prominent bridging septa and focal nodularity suggestive of cirrhosis (grade 1, stage IV).  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 ?- MELD: 14 ?- Child Pugh score: B ?- Follows with [facility].  Feel primary  insult is NASH cirrhosis less likely hemochromatosis due to heterozygous carrier H63D which carries lower risk of warning involvement.  While he does have positive AMA, no evidence of PBC on biopsy and normal IgM.  Plan for continued observation every 6 months. ?  ?Most recent imaging: ?- 11/2020: MRI: Cirrhotic appearing liver with trace perihepatic ascites.  No HCC ?- 08/2011: CT A/P: Unchanged small cirrhotic liver without focal abnormality or duct dilatation.  Normal pancreas.  Small ascites, increased from prior.  Unchanged right inguinal hernia containing ascites without bowel ?  ?  ?Endoscopic history: ?-Colonoscopy x2, last being ~2019 in [place] and n/f polyps, with plan to repeat 5 years.  No records for review.   ?-EGD (05/2020): Normal esophagus, moderate portal hypertensive gastritis, moderate duodenitis.  Referred to ENT for polypoid lesion of base of tongue.  Started high-dose PPI ?- EGD (10/2020, inpatient at [facility]): No esophageal varices, portal hypertensive gastropathy ?  ?FHx: ?- Father with stomach CA in his 52's ? ?HPI:   ? ? ?Patient is a 61 y.o. male presenting to the Gastroenterology Clinic for follow-up.  He was last seen by me on 03/20/2021.  Since then, was seen by Roosevelt Locks at [place] on 07/16/2021.  I recommended restarting lactulose.  Was also not taking Xifaxan due to high cost.  Started PA and provided with co-pay card with recommendation to start at 550 mg bid.  Recommended going back to Lasix 80 mg qod (he had increased the dose on his own) continue spironolactone 100 mg/day.  Plan for 56-monthfollow-up. ? ?Today, his main issue is cramping in his legs, hands, and feet.  He thinks this occurs more so on days that he takes his Lasix.  Tends to have lower extremity/feet cramping at nighttime.  Hand cramping  and contractures can occur during the daytime, and perhaps more so after doing manual labor type activities. ?- 08/08/2021: NA 131, otherwise normal BMP, calcium,  magnesium ? ? ?Was seen by Urologist for right groin swelling last month. Evaluated with ultrasound and diagnosed with inguinal hernia.  He then called his Urologist yesterday due to ongoing symptoms, and was recommended to expedite evaluation in ER. ?- Exam with right inguinal tenderness and hernia along with swelling in the right scrotum and bilateral LE edema ?- NA 133, otherwise normal BMP-AST/ALT 75/54, T. bili 5.0 ?- Lipase 55 ?- Normal WBC, H/H 12/34.5 ?- CT A/P: Right inguinal hernia containing ascites, cirrhosis with sequela of portal hypertension with small ascites.  Large right-sided SMV-IVC shunt again noted.  No enlarged lymph nodes. ?- Treated with 1 dose of Lasix 80 mg IV and recommended follow-up in the surgery clinic along with follow-up in Urology as previously discussed ? ?He is scheduled to see General Surgery on 09/05/2021. ? ?Review of systems:     No chest pain, no SOB, no fevers, no urinary sx  ? ?Past Medical History:  ?Diagnosis Date  ? Cerebrovascular disease   ? Chronic venous insufficiency   ? Colitis 06/01/2021  ? Dyslipidemia   ? Hemochromatosis, hereditary ([place])   ? Medication intolerance   ? Morbid obesity ([place])   ? Nicotine dependence   ? Nonalcoholic steatohepatitis   ? Peripheral vascular disease ([place])   ? Pneumothorax, traumatic   ? Prostate cancer ([place])   ? Prostatic adenocarcinoma ([place])   ? Sleep apnea   ? Stroke [facility])   ? 2 prior to age 33.  ? Venous stasis dermatitis of both lower extremities   ? ? ?Patient's surgical history, family medical history, social history, medications and allergies were all reviewed in Epic  ? ? ?Current Outpatient Medications  ?Medication Sig Dispense Refill  ? docusate sodium (COLACE) 100 MG capsule Take 1 capsule (100 mg total) by mouth 2 (two) times daily. (Patient taking differently: Take 100 mg by mouth daily.) 10 capsule 0  ? furosemide (LASIX) 80 MG tablet TAKE 1 TABLET BY MOUTH EVERY OTHER DAY 30 tablet 1  ? rifaximin (XIFAXAN) 550 MG TABS  tablet Take 1 tablet (550 mg total) by mouth 2 (two) times daily. 60 tablet 3  ? rosuvastatin (CRESTOR) 5 MG tablet Take 5 mg by mouth every other day.    ? spironolactone (ALDACTONE) 100 MG tablet Take 1 tablet (100 mg total) by mouth daily. 30 tablet 1  ? ondansetron (ZOFRAN-ODT) 4 MG disintegrating tablet Take 4 mg by mouth every 8 (eight) hours as needed. (Patient not taking: Reported on 08/19/2021)    ? ?No current facility-administered medications for this visit.  ? ? ?Physical Exam:   ? ? ?BP (!) 152/66   Pulse 66   Ht 6' 4"  (1.93 m)   Wt 281 lb 2 oz (127.5 kg)   BMI 34.22 kg/m?  ? ?GENERAL:  Pleasant male in NAD ?PSYCH: : Cooperative, normal affect ?EENT:  conjunctiva pink, mucous membranes moist, neck supple without masses ?ABDOMEN:  Nondistended, soft, nontender. No obvious masses ?SKIN:  turgor, no lesions seen ?Musculoskeletal: Trace  b/l LE edema.  Normal muscle tone, normal strength ?NEURO: Alert and oriented x 3, no focal neurologic deficits ? ? ?IMPRESSION and PLAN:   ? ?1) NASH Cirrhosis ?2) Lower extremity edema ?3) Heterozygous carrier for hemochromatosis/Iron overload ?4) Portal hypertensive gastropathy ?5) Radiographic ascites ?6) Hepatic encephalopathy ? ?- Resume rifaximin.  Currently  only using lactulose prn due to GI side effect profile.  No recent episodes of encephalopathy ?- Continue low-sodium diet ?- Continue Lasix and Aldactone as currently prescribed ?- Continue follow-up with Roosevelt Locks at [place] in 01/2022 as scheduled ?- EGD in 2024 for EV screening ?- zinc, branch chain amino acids (BCAA), and L-ornithine L-aspartate (LOLA) supplements ?- No NSAIDs.  Okay to use APAP up to 2000 mg/daily as needed for arthralgias/pain ? ?7) Hand and feet cramping ?- Recent electrolyte panel and magnesium were normal ?- Possibly medication ADR?  Aldactone can cause cramping in legs/feet, but not entirely sure if that is the cause of the hand cramping.  Interestingly, Wellbutrin can  also cause cramping, and this was started around 03/2021, and cramping symptoms seem to start after that.  I strongly recommended follow-up with his PCM to discuss a broader differential ? ?8) Inguinal hernia ?- Refe

## 2021-08-19 NOTE — Patient Instructions (Addendum)
If you are age 61 or younger, your body mass index should be between 19-25. Your There is no height or weight on file to calculate BMI. If this is out of the aformentioned range listed, please consider follow up with your Primary Care Provider.  ? ?__________________________________________________________ ? ?The [place] [facility] providers would like to encourage you to use [facility] to communicate with providers for non-urgent requests or questions.  Due to long hold times on the telephone, sending your provider a message by [facility] may be a faster and more efficient way to get a response.  Please allow 48 business hours for a response.  Please remember that this is for non-urgent requests.   ? ?Due to recent changes in healthcare laws, you may see the results of your imaging and laboratory studies on MyChart before your provider has had a chance to review them.  We understand that in some cases there may be results that are confusing or concerning to you. Not all laboratory results come back in the same time frame and the provider may be waiting for multiple results in order to interpret others.  Please give [place] 48 hours in order for your provider to thoroughly review all the results before contacting the office for clarification of your results.  ? ?Please follow up in 9 months. Give [place] a call at [phone] to schedule an appointment. ? ?Thank you for choosing me and [facility]. ? ?Gerrit Heck, D.O. ? ? ? ?

## 2021-08-22 DIAGNOSIS — G629 Polyneuropathy, unspecified: Secondary | ICD-10-CM | POA: Diagnosis not present

## 2021-08-22 DIAGNOSIS — I739 Peripheral vascular disease, unspecified: Secondary | ICD-10-CM | POA: Diagnosis not present

## 2021-08-22 DIAGNOSIS — K7460 Unspecified cirrhosis of liver: Secondary | ICD-10-CM | POA: Diagnosis not present

## 2021-08-22 DIAGNOSIS — R252 Cramp and spasm: Secondary | ICD-10-CM | POA: Diagnosis not present

## 2021-08-25 ENCOUNTER — Encounter: Payer: Self-pay | Admitting: Gastroenterology

## 2021-08-25 MED ORDER — FUROSEMIDE 80 MG PO TABS: 80.0000 mg | ORAL_TABLET | ORAL | 1 refills | Status: DC

## 2021-08-26 ENCOUNTER — Ambulatory Visit: Payer: BC Managed Care – PPO | Admitting: Gastroenterology

## 2021-08-28 DIAGNOSIS — R252 Cramp and spasm: Secondary | ICD-10-CM | POA: Diagnosis not present

## 2021-09-05 DIAGNOSIS — K4091 Unilateral inguinal hernia, without obstruction or gangrene, recurrent: Secondary | ICD-10-CM | POA: Diagnosis not present

## 2021-09-05 DIAGNOSIS — R188 Other ascites: Secondary | ICD-10-CM | POA: Diagnosis not present

## 2021-09-05 DIAGNOSIS — K717 Toxic liver disease with fibrosis and cirrhosis of liver: Secondary | ICD-10-CM | POA: Diagnosis not present

## 2021-09-06 DIAGNOSIS — I700 Atherosclerosis of aorta: Secondary | ICD-10-CM | POA: Diagnosis not present

## 2021-09-06 DIAGNOSIS — R112 Nausea with vomiting, unspecified: Secondary | ICD-10-CM | POA: Diagnosis not present

## 2021-09-06 DIAGNOSIS — K7469 Other cirrhosis of liver: Secondary | ICD-10-CM | POA: Diagnosis not present

## 2021-09-06 DIAGNOSIS — R109 Unspecified abdominal pain: Secondary | ICD-10-CM | POA: Diagnosis not present

## 2021-09-06 DIAGNOSIS — R1110 Vomiting, unspecified: Secondary | ICD-10-CM | POA: Diagnosis not present

## 2021-09-06 DIAGNOSIS — R17 Unspecified jaundice: Secondary | ICD-10-CM | POA: Diagnosis not present

## 2021-09-06 DIAGNOSIS — E860 Dehydration: Secondary | ICD-10-CM | POA: Diagnosis not present

## 2021-09-08 ENCOUNTER — Encounter: Payer: Self-pay | Admitting: Gastroenterology

## 2021-09-08 ENCOUNTER — Telehealth: Payer: Self-pay

## 2021-09-08 NOTE — Telephone Encounter (Signed)
I agree with prescribed Phenergan PR and Reglan.  Use Reglan with caution due to underlying cirrhosis, not to exceed 20 mg / 24 hours.  I recommend starting Reglan at 5 mg tid scheduled for 72 hours to stimulate gastric motility, and can use Phenergan PR for breakthrough nausea/vomiting.  Recommend small, frequent meals with low-fat and low fiber to start.  Okay to use Ensure, boost, etc. as supplemental caloric intake. ?

## 2021-09-08 NOTE — Telephone Encounter (Signed)
Pt's wife sent the following mychart message:  ?Good Morning, ?Darryl Velazquez is unable to keep anything down and has had vomiting since last Tuesday. We went to [facility] emergency room Saturday, and they did bloodwork, X-ray and CT scan, but was unable to indentify any issues other than elevated Billirubin 5.9%. They gave him nausea medicine and recommended he call his GI doctor. Can someone call him at [phone] and advise what he should do? He is becoming very weak. Thank you. Darryl Velazquez [phone]. ?  ? ?Spoke with pt about nausea and vomiting and he stated that he is able to keep some liquids down but unable to keep down any food. In the last 24 hours he hasn't vomited but has experienced dry heaving because pt states he has nothing in his stomach to throw up. Pt reports that he hasn't taken any of his medications since last Wednesday. Pt denies fever/chills, dizziness, abdominal distension or bloating, or diarrhea. Pt reports he has had a bit of a headache. At the ED pt was prescribed phenergan suppositories and reglan but has been unable to pick these medications up from [facility] yet. Pt's wife stated that she was going to call today to see why these medications were not ready yet. Pt has been taking left over zofran and phenergan PO at home but reports this hasn't helped. Pt states he has abd pain in his sides from throwing up so much.  ?

## 2021-09-08 NOTE — Telephone Encounter (Signed)
Spoke with pt's wife and gave her recommendations. Pt's wife verbalized understanding and had no other concerns at end of call.  ?

## 2021-09-08 NOTE — Telephone Encounter (Signed)
See 09/08/21 telephone encounter. ?

## 2021-09-13 DIAGNOSIS — R1030 Lower abdominal pain, unspecified: Secondary | ICD-10-CM | POA: Diagnosis not present

## 2021-09-13 DIAGNOSIS — Z8249 Family history of ischemic heart disease and other diseases of the circulatory system: Secondary | ICD-10-CM | POA: Diagnosis not present

## 2021-09-13 DIAGNOSIS — K7469 Other cirrhosis of liver: Secondary | ICD-10-CM | POA: Diagnosis not present

## 2021-09-13 DIAGNOSIS — K4090 Unilateral inguinal hernia, without obstruction or gangrene, not specified as recurrent: Secondary | ICD-10-CM | POA: Diagnosis not present

## 2021-09-13 DIAGNOSIS — R1084 Generalized abdominal pain: Secondary | ICD-10-CM | POA: Diagnosis not present

## 2021-09-13 DIAGNOSIS — E7220 Disorder of urea cycle metabolism, unspecified: Secondary | ICD-10-CM | POA: Diagnosis not present

## 2021-09-13 DIAGNOSIS — Z8673 Personal history of transient ischemic attack (TIA), and cerebral infarction without residual deficits: Secondary | ICD-10-CM | POA: Diagnosis not present

## 2021-09-13 DIAGNOSIS — R188 Other ascites: Secondary | ICD-10-CM | POA: Diagnosis not present

## 2021-09-13 DIAGNOSIS — K7460 Unspecified cirrhosis of liver: Secondary | ICD-10-CM | POA: Diagnosis not present

## 2021-09-13 DIAGNOSIS — E8809 Other disorders of plasma-protein metabolism, not elsewhere classified: Secondary | ICD-10-CM | POA: Diagnosis not present

## 2021-09-13 DIAGNOSIS — R109 Unspecified abdominal pain: Secondary | ICD-10-CM | POA: Diagnosis not present

## 2021-09-13 DIAGNOSIS — Z833 Family history of diabetes mellitus: Secondary | ICD-10-CM | POA: Diagnosis not present

## 2021-09-13 DIAGNOSIS — Z6831 Body mass index (BMI) 31.0-31.9, adult: Secondary | ICD-10-CM | POA: Diagnosis not present

## 2021-09-13 DIAGNOSIS — Z8719 Personal history of other diseases of the digestive system: Secondary | ICD-10-CM | POA: Diagnosis not present

## 2021-09-13 DIAGNOSIS — Z9889 Other specified postprocedural states: Secondary | ICD-10-CM | POA: Diagnosis not present

## 2021-09-13 DIAGNOSIS — Z8546 Personal history of malignant neoplasm of prostate: Secondary | ICD-10-CM | POA: Diagnosis not present

## 2021-09-13 DIAGNOSIS — E8770 Fluid overload, unspecified: Secondary | ICD-10-CM | POA: Diagnosis not present

## 2021-09-13 DIAGNOSIS — E669 Obesity, unspecified: Secondary | ICD-10-CM | POA: Diagnosis not present

## 2021-09-13 DIAGNOSIS — K652 Spontaneous bacterial peritonitis: Secondary | ICD-10-CM | POA: Diagnosis not present

## 2021-09-13 DIAGNOSIS — Z79899 Other long term (current) drug therapy: Secondary | ICD-10-CM | POA: Diagnosis not present

## 2021-09-17 ENCOUNTER — Encounter: Payer: Self-pay | Admitting: Gastroenterology

## 2021-09-18 ENCOUNTER — Other Ambulatory Visit: Payer: Self-pay | Admitting: Gastroenterology

## 2021-09-18 ENCOUNTER — Other Ambulatory Visit: Payer: Self-pay

## 2021-09-18 DIAGNOSIS — K7460 Unspecified cirrhosis of liver: Secondary | ICD-10-CM

## 2021-09-18 DIAGNOSIS — K7469 Other cirrhosis of liver: Secondary | ICD-10-CM

## 2021-09-18 DIAGNOSIS — T148XXA Other injury of unspecified body region, initial encounter: Secondary | ICD-10-CM

## 2021-09-18 DIAGNOSIS — R188 Other ascites: Secondary | ICD-10-CM

## 2021-09-18 NOTE — Telephone Encounter (Signed)
Attempted to call pt. Left voicemail for pt to call back. Abdominal US scheduled at [place] on Monday 09/22/21 at 11 am. Pt needs to arrive by 10:45 and be NPO for 6 hours prior to [place]. Follow up appt scheduled with Dr. Bryan Lemma on 09/24/21 at 9:20 am.  ?

## 2021-09-18 NOTE — Telephone Encounter (Signed)
Available hospital records reviewed.  Admitted 4/8-11, 2023 with abdominal pain and ascites.  A paracentesis on 4/10 with 3 L fluid removal and diuresed with IV Lasix, and discharged on oral Lasix at prior home dose.  Paracentesis negative for SBP did have hyperammonemia fluctuated during course of hospitalization, and continued on rifaximin and reinitiated lactulose (patient had previously stopped this due to poor taste).  Recommended follow-up with PCM in 1 week.  Also started on Pepcid 20 mg/day.  Stopped Xanax, aspirin, Protonix, KCl. ? ?Bruising along the bottom of the stomach can be seen after paracentesis may be a sign of a hematoma.  If no abdominal pain, fever, or other concerns, can monitor for now.  If fever or increasing pain or other concerns, would need expedited evaluation in ER. ? ?If he is starting to retain fluid again, then this would be a sign that the ascites is accumulating more rapidly, which could require serial therapeutic paracentesis. ? ?Recommend the following: ?- Continue Lasix, Aldactone as prescribed ?- Continue rifaximin and encouraged to take lactulose as prescribed ?- Abdominal ultrasound to evaluate bruising for hematoma along with evaluation for degree of ascites ?- Schedule follow-up with me or one of the APP's ?

## 2021-09-18 NOTE — Telephone Encounter (Signed)
Pt returned call. Gave pt results and recommendations. Pt requested that I contact his wife and give this information her. Spoke with wife and gave her recommendations. Pt's wife verbalized understanding and had no other questions at end of call. Also told wife that I would send recommendations to pt's mychart.  ?

## 2021-09-19 NOTE — Telephone Encounter (Signed)
Spironolactone 121m sent to the pharmacy. Spoke with Patient's wife. Let her know that he needs to recheck BMP 7 10 days later. Order is placed. Patient has upcoming appointment with Dr. CBryan Lemmaon 09/24/21. She verbalized understanding. ?

## 2021-09-22 ENCOUNTER — Other Ambulatory Visit: Payer: Self-pay | Admitting: Gastroenterology

## 2021-09-22 ENCOUNTER — Ambulatory Visit (HOSPITAL_BASED_OUTPATIENT_CLINIC_OR_DEPARTMENT_OTHER)
Admission: RE | Admit: 2021-09-22 | Discharge: 2021-09-22 | Disposition: A | Discharge status: Home / Self Care | Payer: BC Managed Care – PPO | Source: Ambulatory Visit | Attending: Gastroenterology | Admitting: Gastroenterology

## 2021-09-22 DIAGNOSIS — T148XXA Other injury of unspecified body region, initial encounter: Secondary | ICD-10-CM

## 2021-09-22 DIAGNOSIS — K7469 Other cirrhosis of liver: Secondary | ICD-10-CM

## 2021-09-22 DIAGNOSIS — R188 Other ascites: Secondary | ICD-10-CM

## 2021-09-22 IMAGING — US US ABDOMEN LIMITED
1 series · 14 of 25 positions shown · non-contrast
Comparison: None.

CLINICAL DATA: to evaluate bruising for hematoma along with
evaluation for degree of ascites

EXAM:
LIMITED ABDOMEN ULTRASOUND FOR ASCITES
TECHNIQUE: Limited ultrasound survey for ascites was performed in all four
abdominal quadrants.

[Series 1: us abdomen complete · 35 acquisitions, 14 frames shown]
[im 1/35]
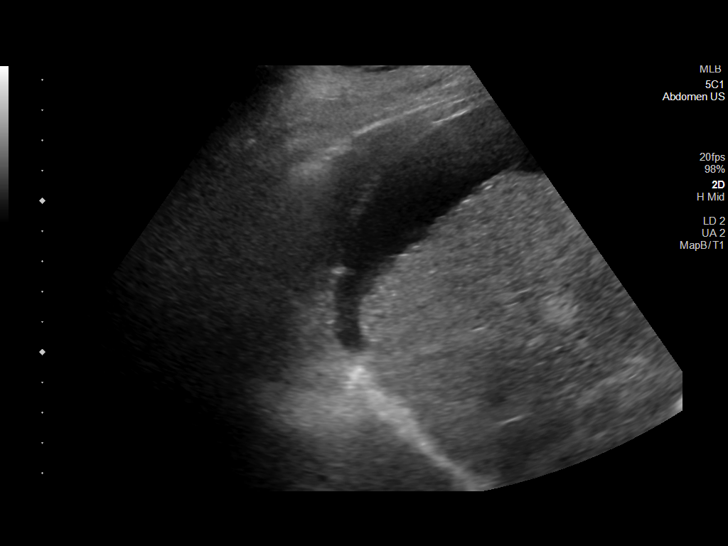
[im 3/35]
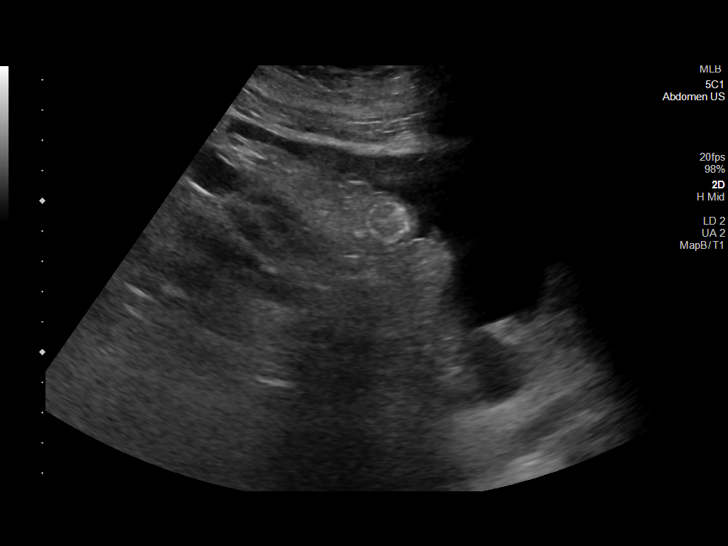
[im 6/35]
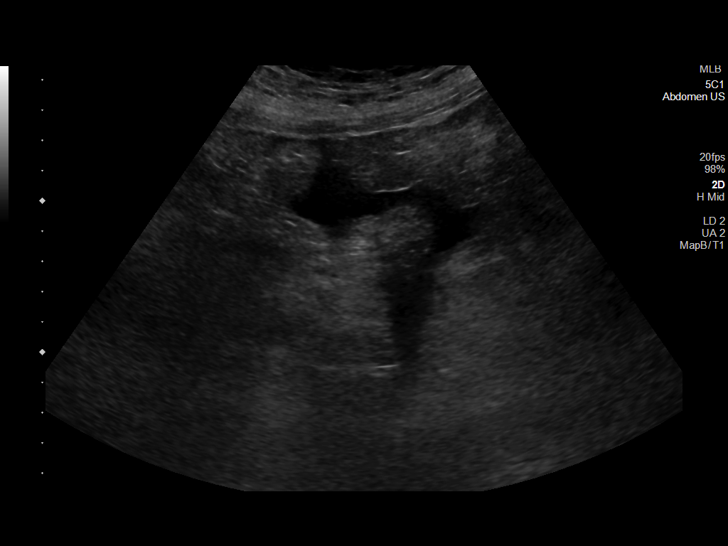
[im 9/35]
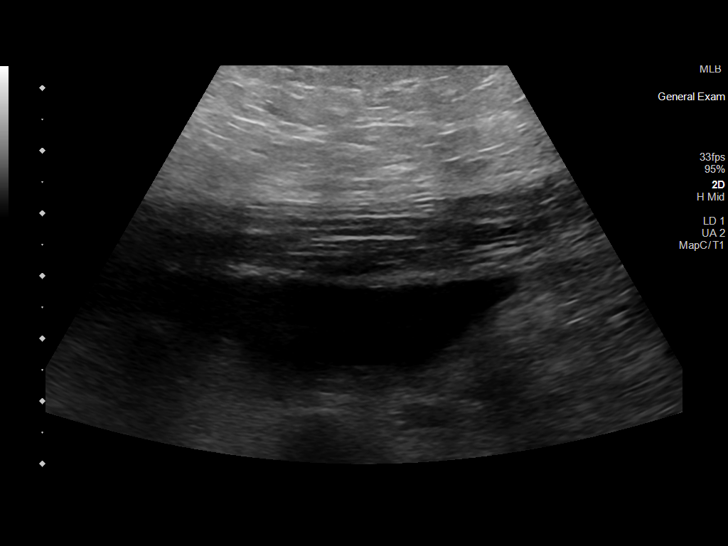
[im 12/35]
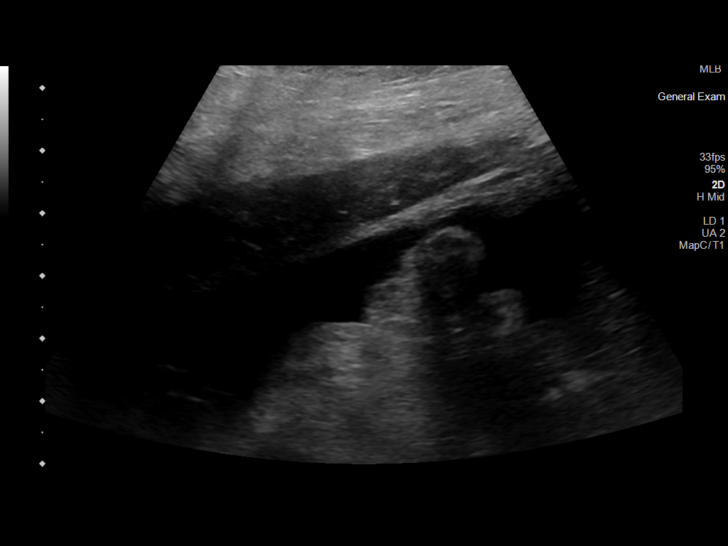
[im 13/35]
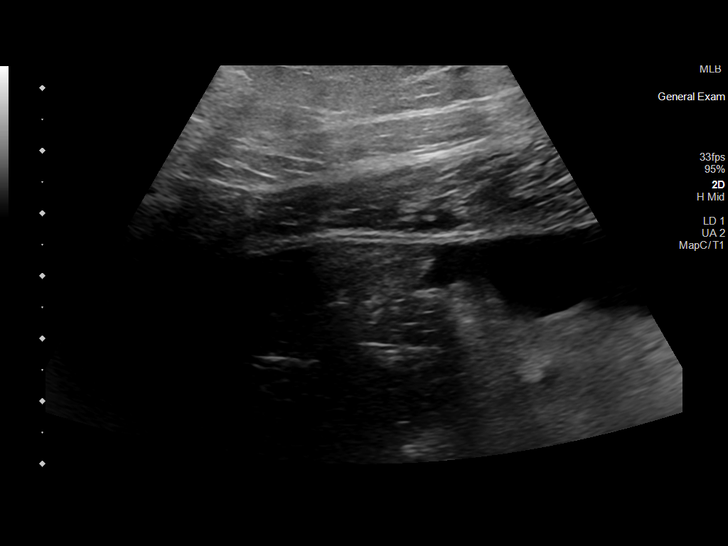
[im 16/35]
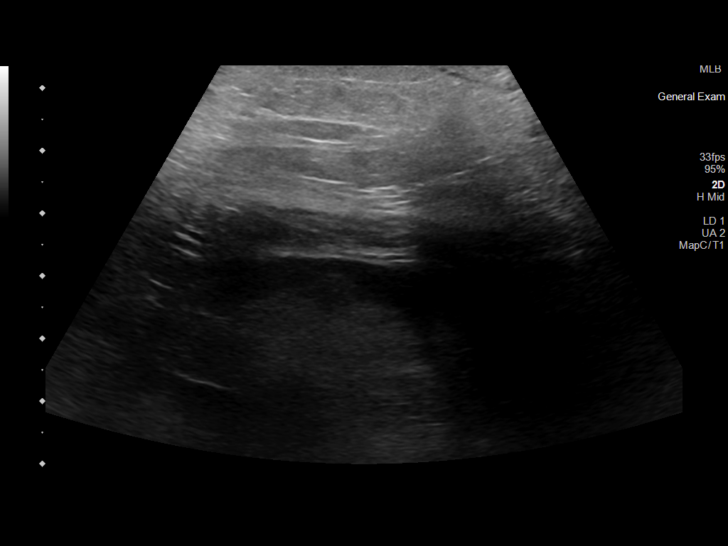
[im 19/35]
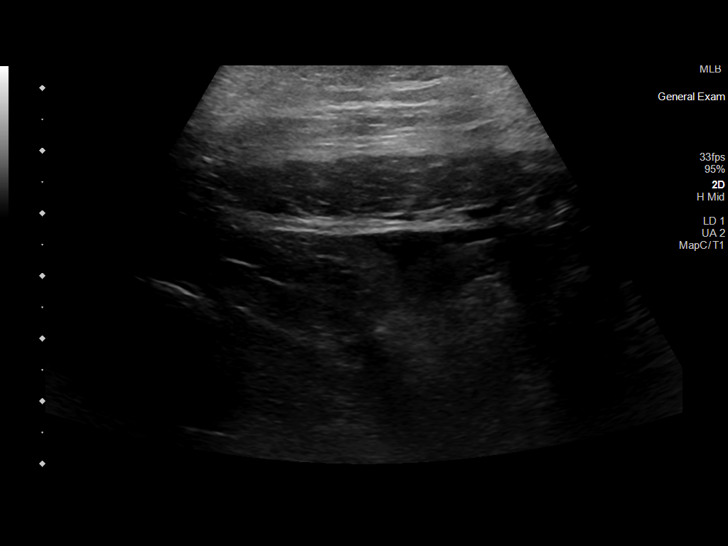
[im 22/35]
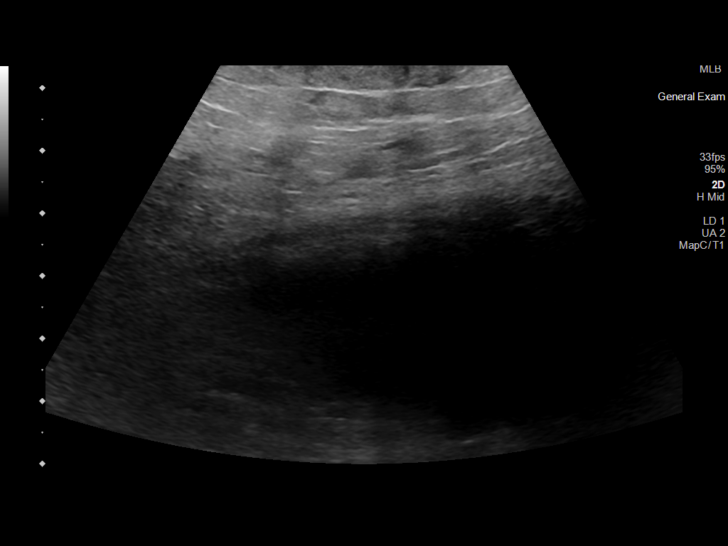
[im 23/35]
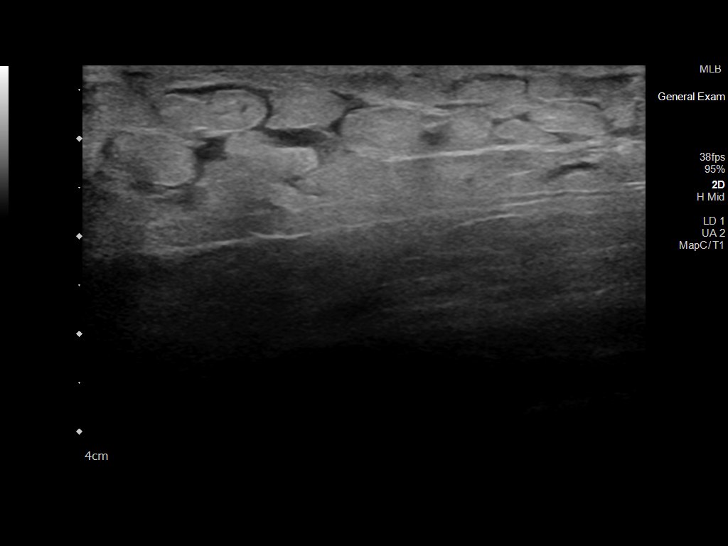
[im 26/35]
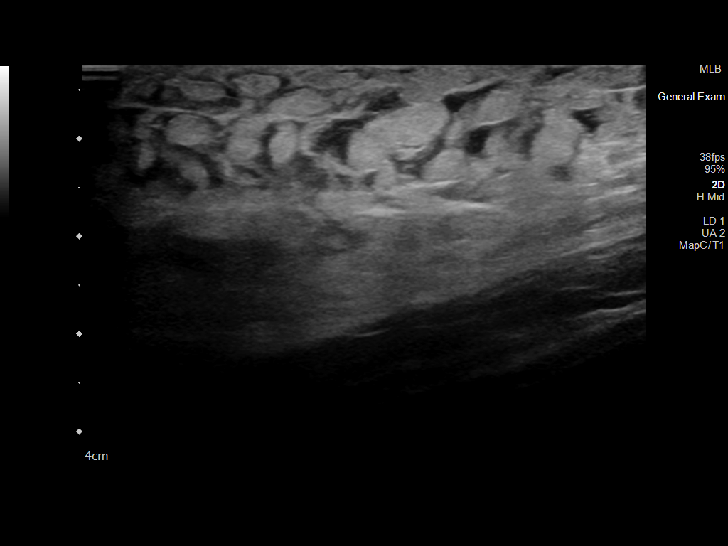
[im 29/35]
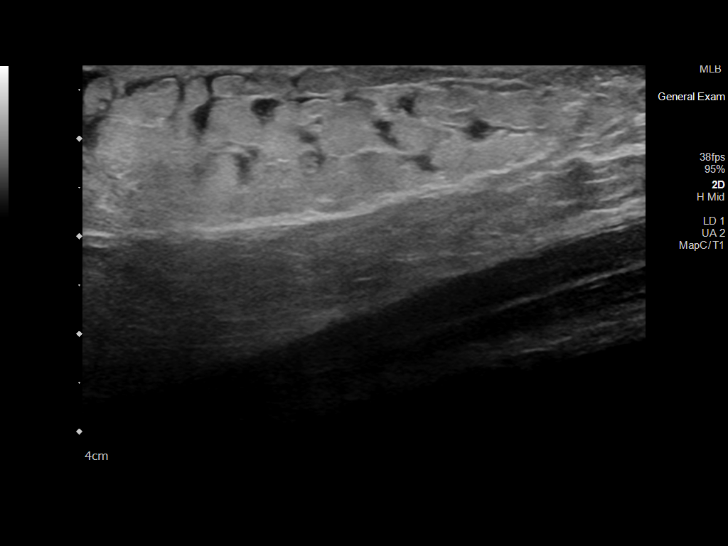
[im 32/35]
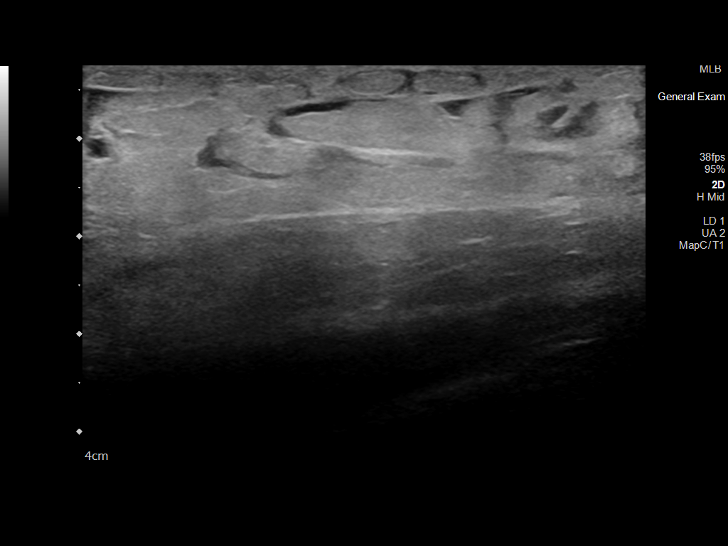
[im 35/35]
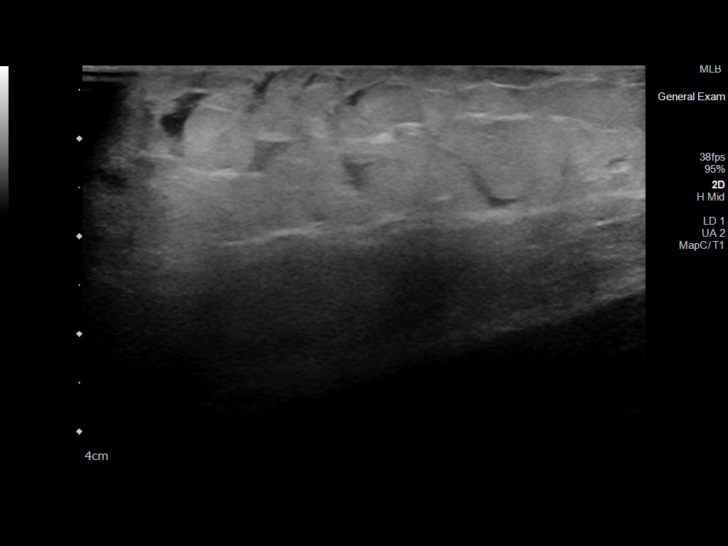

[14 of 25 positions shown; findings below may reference images not displayed]

FINDINGS: Small volume ascites. Additional focused sonographic exam of the
subcutaneous tissues demonstrates subcutaneous edema without
findings of hematoma.
IMPRESSION: 1. Small volume ascites.
2. Subcutaneous edema without findings of hematoma.

## 2021-09-24 ENCOUNTER — Other Ambulatory Visit (INDEPENDENT_AMBULATORY_CARE_PROVIDER_SITE_OTHER): Payer: BC Managed Care – PPO

## 2021-09-24 ENCOUNTER — Ambulatory Visit (INDEPENDENT_AMBULATORY_CARE_PROVIDER_SITE_OTHER): Payer: BC Managed Care – PPO | Admitting: Gastroenterology

## 2021-09-24 ENCOUNTER — Encounter: Payer: Self-pay | Admitting: Gastroenterology

## 2021-09-24 ENCOUNTER — Telehealth: Payer: Self-pay

## 2021-09-24 VITALS — BP 140/72 | HR 76 | Ht 76.0 in | Wt 283.5 lb

## 2021-09-24 DIAGNOSIS — K7682 Hepatic encephalopathy: Secondary | ICD-10-CM | POA: Diagnosis not present

## 2021-09-24 DIAGNOSIS — K7581 Nonalcoholic steatohepatitis (NASH): Secondary | ICD-10-CM | POA: Diagnosis not present

## 2021-09-24 DIAGNOSIS — K7460 Unspecified cirrhosis of liver: Secondary | ICD-10-CM

## 2021-09-24 DIAGNOSIS — R600 Localized edema: Secondary | ICD-10-CM | POA: Diagnosis not present

## 2021-09-24 DIAGNOSIS — R188 Other ascites: Secondary | ICD-10-CM

## 2021-09-24 DIAGNOSIS — K766 Portal hypertension: Secondary | ICD-10-CM

## 2021-09-24 DIAGNOSIS — K3189 Other diseases of stomach and duodenum: Secondary | ICD-10-CM

## 2021-09-24 DIAGNOSIS — R252 Cramp and spasm: Secondary | ICD-10-CM

## 2021-09-24 LAB — COMPREHENSIVE METABOLIC PANEL
ALT: 71 U/L — ABNORMAL HIGH (ref 0–53)
AST: 98 U/L — ABNORMAL HIGH (ref 0–37)
Albumin: 2.9 g/dL — ABNORMAL LOW (ref 3.5–5.2)
Alkaline Phosphatase: 162 U/L — ABNORMAL HIGH (ref 39–117)
BUN: 27 mg/dL — ABNORMAL HIGH (ref 6–23)
CO2: 30 mEq/L (ref 19–32)
Calcium: 8.6 mg/dL (ref 8.4–10.5)
Chloride: 95 mEq/L — ABNORMAL LOW (ref 96–112)
Creatinine, Ser: 1.12 mg/dL (ref 0.40–1.50)
GFR: 71.37 mL/min (ref >60.00)
Glucose, Bld: 143 mg/dL — ABNORMAL HIGH (ref 70–99)
Potassium: 4.6 mEq/L (ref 3.5–5.1)
Sodium: 129 mEq/L — ABNORMAL LOW (ref 135–145)
Total Bilirubin: 3.7 mg/dL — ABNORMAL HIGH (ref 0.2–1.2)
Total Protein: 6.8 g/dL (ref 6.0–8.3)

## 2021-09-24 LAB — CBC
HCT: 36.7 % — ABNORMAL LOW (ref 39.0–52.0)
Hemoglobin: 12.2 g/dL — ABNORMAL LOW (ref 13.0–17.0)
MCHC: 33.4 g/dL (ref 30.0–36.0)
MCV: 88.6 fl (ref 78.0–100.0)
Platelets: 190 10*3/uL (ref 150.0–400.0)
RBC: 4.14 Mil/uL — ABNORMAL LOW (ref 4.22–5.81)
RDW: 16.7 % — ABNORMAL HIGH (ref 11.5–15.5)
WBC: 8.5 10*3/uL (ref 4.0–10.5)

## 2021-09-24 LAB — PROTIME-INR
INR: 1.5 ratio — ABNORMAL HIGH (ref 0.8–1.0)
Prothrombin Time: 16.2 s — ABNORMAL HIGH (ref 9.6–13.1)

## 2021-09-24 NOTE — Telephone Encounter (Signed)
Informed the patient his paracentesis appointment is tomorrow at [facility] at 1:00 pm, and be there by 12:30 pm. Patient voiced understanding. ?

## 2021-09-24 NOTE — Addendum Note (Signed)
Addended by: Manuela Schwartz on: 09/24/2021 10:46 AM ? ? Modules accepted: Orders ? ?

## 2021-09-24 NOTE — Telephone Encounter (Signed)
Informed his follow up appointment with Dr. Bryan Lemma is scheduled on 11/26/21 at 8:40 am. ?

## 2021-09-24 NOTE — Patient Instructions (Addendum)
If you are age 61 or younger, your body mass index should be between 19-25. Your Body mass index is 34.51 kg/m?Marland Kitchen If this is out of the aformentioned range listed, please consider follow up with your Primary Care Provider.  ? ?__________________________________________________________ ? ? ?The [facility] providers would like to encourage you to use [facility] to communicate with providers for non-urgent requests or questions.  Due to long hold times on the telephone, sending your provider a message by [facility] may be a faster and more efficient way to get a response.  Please allow 48 business hours for a response.  Please remember that this is for non-urgent requests.   ? ?Due to recent changes in healthcare laws, you may see the results of your imaging and laboratory studies on MyChart before your provider has had a chance to review them.  We understand that in some cases there may be results that are confusing or concerning to you. Not all laboratory results come back in the same time frame and the provider may be waiting for multiple results in order to interpret others.  Please give [place] 48 hours in order for your provider to thoroughly review all the results before contacting the office for clarification of your results.  ? ?Please go to the lab on the 2nd floor [address] before you leave the office today.  ? ?Increase lasix to 81m every morning, and 419mevery evening. ? ?Please follow up in 2 months. Give [place] call at [phone] schedule an appointment.  ? ?You have been scheduled for an abdominal paracentesis at ________ radiology (1st floor of hospital) on _______ at __________. Please arrive at least 15 minutes prior to your appointment time for registration. Should you need to reschedule this appointment for any reason, please call our office at [phone] ? ?Thank you for choosing me and [facility]. ? ?ViGerrit HeckD.O. ? ? ? ?We want to thank you for trusting [facility] with your care. All of our staff and providers value the relationships we have built with our patients, and it is an honor to care for you.  ? ?We are writing to let you know that [facility] will close on Oct 20, 2021, and we invite you to continue to see Dr. RaCarmell Austriand ViGerrit Heckt the [facility] [place] location. We are consolidating our serices at these [facility] to better provide care. Our office staff will work with you to ensure a seamless transition.  ? ?ViGerrit HeckDO -Dr. CiBryan Lemmaill be movig to [facility] at [address] [address]effective Oct 20, 2021.  Contact [phone] to schedule an appointment with him.  ? ?RaCarmell AustriaMD- Dr. GuLyndel Safeill be movig to [facility] at [address] [id number] Oct 20, 2021.  Contact [phone] to schedule an appointment with him.  ? ?Requesting Medical Records ?If you need to request your medical records, please follow the instructions below. Your medical records are confidential, and a copy can be transferred to another provider or released to you or another person you designate only with your permission. ? ?There are several ways to request your medical records: ?Requests for medical records can be submitted through our practice.   ?You can also request your records electronically, in your MyChart account by selecting the ?Request Health Records? tab.  ?If you need additional information on how to request  records, please go to [email or web address]/, choose Patient Information, then select Request Medical Records. ?To make an appointment or if you have any questions about your health care needs, please contact our office at [phone] and one of our staff members will be glad to assist you. ?[place] is committed to providing exceptional care for you and our community. Thank you for  allowing [place] to serve your health care needs. ?Sincerely, ? ?Windy Canny, Director [facility] ?[facility] also offers convenient virtual care options. Sore throat? Sinus problems? Cold or flu symptoms? Get care from the comfort of home with [facility] Video Visits and e-Visits. Learn more about the non-emergency conditions treated and start your virtual visit at [email or web address] ? ?

## 2021-09-24 NOTE — Progress Notes (Signed)
? ?Chief Complaint:    Cirrhosis, hospital follow-up ? 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 ?  ?-Appears he has had mildly elevated liver enzymes since 03/2016 (AST/ALT 56/67) with normal T bili 0.6 at that time. ?-04/2020: Evaluated by Dr. [name] in Cardiology clinic for new onset LE edema.  TTE unremarkable.  Started on diuretics. ?-Follows in the Hematology clinic for hemochromatosis with heterozygous H63D mutation.  Started weekly phlebotomy in 06/2020. ?  ?Cirrhosis Evaluation: ?- Etiology: NASH.  Hemochromatosis not felt to be contributory ?- Complications: Radiographic ascites, portal hypertensive gastropathy, lower extremity edema, hepatic encephalopathy ?- HCC screening: UTD.  Elevated AFP, but MRI/MRCP 11/2020 without [place] ?- Variceal screening: EGD 10/2020.  No EV ?- Serologic evaluation: Completed 05/2020. Hemochromatosis DNA positive for 1 H63D (carrier of hemochromatosis). -Elevated IgA (856), elevated IgG (2209), elevated/positive antimitochondrial antibody (43.2), and elevated AFP (12.7). Ferritin 419 (previously 968), iron saturation at 62.5% . Normal/negative ANA, ASMA, ceruloplasmin, viral hepatitis panel, IgM, HIV ?- Viral hepatitis vaccination: Negative HBsAb, HAVAb.  Completed vaccine series ?- Flu vaccine: UTD ?- Liver biopsy: 05/16/2020: Chronic minimally active hepatitis with prominent bridging septa and focal nodularity suggestive of cirrhosis (grade 1, stage IV).  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  ?- MELD: 22 ?- Child Pugh score: C ?- Follows with [facility].  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  While he does have positive AMA, no evidence of PBC on biopsy and normal IgM.  Plan for continued observation every 6 months. ?  ?Most recent imaging: ?- 11/2020: MRI: Cirrhotic appearing liver with trace perihepatic ascites.  No HCC ?- 08/2021: CT A/P: Unchanged small cirrhotic liver without focal abnormality or duct dilatation.  Normal pancreas.  Small ascites, increased from prior.  Unchanged right inguinal hernia containing ascites without bowel ?  ?  ?Endoscopic history: ?-Colonoscopy x2, last being ~2019 in [place] and n/f polyps, with plan to repeat 5 years.  No records for review.   ?-EGD (05/2020): Normal esophagus, moderate portal hypertensive gastritis, moderate duodenitis.  Referred to ENT for polypoid lesion of base of tongue.  Started high-dose PPI ?- EGD (10/2020, inpatient at [facility]): No esophageal varices, portal hypertensive gastropathy ?  ?FHx: ?- Father with stomach CA in his 78's ? ?HPI:   ? ? ?Patient is a 61 y.o. male presenting to the Gastroenterology Clinic for follow-up.  Last seen by me on 08/19/2021.  Last seen in [facility] on 07/16/2021.  Was seen by Dr. [name] in the General Surgery clinic on 09/05/2021 for evaluation of right inguinal hernia.  Deemed a poor candidate for hernia repair. ? ?-08/18/2021: CT abdomen/pelvis: Cirrhosis with portal hypertension, no liver mass, normal spleen, GI tract.  Small ascites, increased from prior. ? ? ?Since then, admitted to [facility]11, 2023 with abdominal pain and ascites.  A paracentesis on 4/10 with 3 L fluid removal and diuresed with IV Lasix, and discharged on oral Lasix at prior home dose.  Paracentesis negative for SBP did have hyperammonemia fluctuated during course of hospitalization, and continued  on rifaximin and reinitiated lactulose (patient had previously stopped this due to  poor taste).  Also started on Pepcid 20 mg/day.  Stopped Xanax, aspirin, Protonix, KCl. ?- 09/14/2021: Sodium 128, BUN/creatinine 20/0.7, protein 5.3, albumin 2.2, T. bili 2.6, AST/ALT 59/48, ALP 143.  PT/INR 19.4/1.66.  PLT 129 (MELD 22) ?- 09/16/2021: H/H 11.8/35.3, PLT 120, NA 129, BUN/creatinine 22/0.83.  Ammonia 78 ? ?-09/22/2021: Abdominal ultrasound with small volume ascites, subcutaneous edema without hematoma in the area of bruising. ? ?Continues to have  b/l hand cramping.  Definitely worse feet.  Saw Orthopedics started taurine and Robaxin, but not much relief.  Symptoms are better if she is essentially inactive for the day. ? ? ? ?Review of systems:     No chest pain, no SOB, no fevers, no urinary sx  ? ?Past Medical History:  ?Diagnosis Date  ? Cerebrovascular disease   ? Chronic venous insufficiency   ? Colitis 06/01/2021  ? Dyslipidemia   ? Hemochromatosis, hereditary ([place])   ? Medication intolerance   ? Morbid obesity ([place])   ? Nicotine dependence   ? Nonalcoholic steatohepatitis   ? Peripheral vascular disease ([place])   ? Pneumothorax, traumatic   ? Prostate cancer ([place])   ? 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 Sleep apnea   ? Stroke [facility])   ? 2 prior to age 54.  ? Venous stasis dermatitis of both lower extremities   ? ? ?Patient's surgical history, family medical history, social history, medications and allergies were all reviewed in Epic  ? ? ?Current Outpatient Medications  ?Medication Sig Dispense Refill  ? docusate sodium (COLACE) 100 MG capsule Take 1 capsule (100 mg total) by mouth 2 (two) times daily. (Patient taking differently: Take 100 mg by mouth daily.) 10 capsule 0  ? furosemide (LASIX) 80 MG tablet Take 1 tablet (80 mg total) by mouth every other day. 30 tablet 1  ? ondansetron (ZOFRAN-ODT) 4 MG disintegrating tablet Take 4 mg by mouth every 8 (eight) hours as needed.    ? rifaximin (XIFAXAN) 550 MG TABS tablet Take 1 tablet (550 mg total) by mouth 2 (two) times daily. 60 tablet 3  ?  rosuvastatin (CRESTOR) 5 MG tablet Take 5 mg by mouth every other day.    ? spironolactone (ALDACTONE) 100 MG tablet Take 1 tablet (100 mg total) by mouth daily. 30 tablet 1  ? ?No current facility-administered medications for this visit.  ? ? ?Physical Exam:   ? ? ?BP 140/72 (BP Location: Left Arm, Patient Position: Sitting, Cuff Size: Normal)   Pulse 76   Ht 6' 4"  (1.93 m)   Wt 283 lb 8 oz (128.6 kg)   SpO2 96%   BMI 34.51 kg/m?  ? ?GENERAL:  Pleasant male in NAD ?PSYCH: : Cooperative, normal affect ?CARDIAC:  RRR, no murmur heard, no peripheral edema ?PULM: Normal respiratory effort, lungs CTA bilaterally, no wheezing ?ABDOMEN: Slightly distended with  + fluid wave.  Mild ecchymosis in lower abdomen.  Soft, normal bowel sounds ?SKIN: Slightly jaundiced complexion ?Musculoskeletal:  Normal muscle tone, normal strength ?NEURO: Alert and oriented x 3, no asterixis ? ? ?IMPRESSION and PLAN:   ? ?1) NASH Cirrhosis ?2) Lower extremity edema ?3) Heterozygous carrier for hemochromatosis/Iron overload ?4) Portal hypertensive gastropathy ?5) Ascites ?6) Hepatic encephalopathy ? ?- MELD increased to 22 during recent hospitalization (previously 14) along with ascites requiring paracentesis, all indicating decompensation of his liver.  I discussed that at length today with the patient and his spouse ?-  Set up for therapeutic paracentesis.  No more than 5 bowel removed.  Order placed to treat with albumin 25% during paracentesis if large volume removed ?- Repeat labs today with recalculation of MELD ?- Schedule follow-up appointment with [facility] at [place] given recent decompensating event and reevaluation for transplant candidacy ?- Continue low-sodium diet ?- Will trial adjustment of diuretics as below to see if this helps out with his hand cramping all ?- Start zinc supplementation ?- Continue rifaximin and lactulose ?- Depending on need for repeat therapeutic paracentesis, did discuss possibility of  TIPS.  Some possible contraindications include history of hepatic encephalopathy and MELD > 18 ? ?7) Hand cramping ?- Most likely etiology is cramping related to underlying cirrhosis.  Did discuss possible medication AD

## 2021-09-25 ENCOUNTER — Other Ambulatory Visit: Payer: Self-pay

## 2021-09-25 ENCOUNTER — Ambulatory Visit (HOSPITAL_COMMUNITY)
Admission: RE | Admit: 2021-09-25 | Discharge: 2021-09-25 | Disposition: A | Discharge status: Home / Self Care | Payer: BC Managed Care – PPO | Source: Ambulatory Visit | Attending: Gastroenterology | Admitting: Gastroenterology

## 2021-09-25 DIAGNOSIS — K7460 Unspecified cirrhosis of liver: Secondary | ICD-10-CM | POA: Insufficient documentation

## 2021-09-25 DIAGNOSIS — R188 Other ascites: Secondary | ICD-10-CM | POA: Insufficient documentation

## 2021-09-25 HISTORY — PX: IR PARACENTESIS: IMG2679

## 2021-09-25 LAB — BODY FLUID CELL COUNT WITH DIFFERENTIAL
Eos, Fluid: 0 %
Lymphs, Fluid: 49 %
Monocyte-Macrophage-Serous Fluid: 49 % — ABNORMAL LOW (ref 50–90)
Neutrophil Count, Fluid: 2 % (ref 0–25)
Total Nucleated Cell Count, Fluid: 147 cu mm (ref 0–1000)

## 2021-09-25 LAB — PROTEIN, PLEURAL OR PERITONEAL FLUID: Total protein, fluid: <3 g/dL

## 2021-09-25 LAB — ALBUMIN, PLEURAL OR PERITONEAL FLUID: Albumin, Fluid: <1.5 g/dL

## 2021-09-25 MED ORDER — LIDOCAINE HCL 1 % IJ SOLN
INTRAMUSCULAR | Status: AC
  Filled 2021-09-25: qty 20

## 2021-09-25 MED ORDER — LIDOCAINE HCL (PF) 1 % IJ SOLN
INTRAMUSCULAR | Status: DC | PRN
  Administered 2021-09-25: 5 mL

## 2021-09-25 NOTE — Procedures (Signed)
PROCEDURE SUMMARY: ? ?Successful image-guided paracentesis from the right lower abdomen.  ?Yielded 1.6 liters of cloudy cream colored fluid.  ?No immediate complications.  ?EBL = trace. ?Patient tolerated well.  ? ?Specimen was sent for labs. ? ?Please see imaging section of Epic for full dictation. ? ? ?Bellamy Rubey H Abigael Mogle PA-C ?09/25/2021 ?1:16 PM ? ? ? ?

## 2021-09-26 ENCOUNTER — Telehealth: Payer: Self-pay | Admitting: Gastroenterology

## 2021-09-26 LAB — CYTOLOGY - NON PAP

## 2021-09-26 NOTE — Telephone Encounter (Signed)
Spoke with pt. See 4/19 result note.  ?

## 2021-09-26 NOTE — Telephone Encounter (Signed)
Patient returning your call, please advise.  ?

## 2021-09-30 LAB — AEROBIC/ANAEROBIC CULTURE W GRAM STAIN (SURGICAL/DEEP WOUND)
Culture: NO GROWTH
Gram Stain: NONE SEEN

## 2021-10-02 ENCOUNTER — Encounter: Payer: Self-pay | Admitting: Gastroenterology

## 2021-10-02 DIAGNOSIS — K7460 Unspecified cirrhosis of liver: Secondary | ICD-10-CM | POA: Diagnosis not present

## 2021-10-02 DIAGNOSIS — G4700 Insomnia, unspecified: Secondary | ICD-10-CM | POA: Diagnosis not present

## 2021-10-02 DIAGNOSIS — R188 Other ascites: Secondary | ICD-10-CM | POA: Diagnosis not present

## 2021-10-02 DIAGNOSIS — K7682 Hepatic encephalopathy: Secondary | ICD-10-CM

## 2021-10-03 ENCOUNTER — Other Ambulatory Visit: Payer: Self-pay

## 2021-10-03 NOTE — Telephone Encounter (Signed)
Pt has been scheduled for a paracentesis at [facility] on Tuesday, 10/07/21 at 1 pm. Pt will need to arrive at [facility] by 12:30 pm. Pt notified of appt via [place]. ? ?Pt is scheduled for an appt with Dr. Zollie Scale on 10/21/21. ?

## 2021-10-03 NOTE — Telephone Encounter (Signed)
Please place referral to IR for therapeutic paracentesis.  Limit removal to 5 L.  Treat with albumin 25% 6 g/L removed.  Send fluid for cell count, Gram stain, culture.   ? ?Please inquire with patient whether or not he has been able to schedule an appointment at [place] as previously discussed. ?

## 2021-10-05 ENCOUNTER — Encounter: Payer: Self-pay | Admitting: Gastroenterology

## 2021-10-06 DIAGNOSIS — R609 Edema, unspecified: Secondary | ICD-10-CM | POA: Diagnosis not present

## 2021-10-06 DIAGNOSIS — R188 Other ascites: Secondary | ICD-10-CM | POA: Diagnosis not present

## 2021-10-06 DIAGNOSIS — K7460 Unspecified cirrhosis of liver: Secondary | ICD-10-CM | POA: Diagnosis not present

## 2021-10-06 NOTE — Telephone Encounter (Signed)
Agree with restarting Aldactone.  Continue taking higher dose Lasix for the time being.  The seeping from the legs is from the fluid, and not necessarily indicative of infection.  If concern for infection such as cellulitis, would be better to get this evaluated in person by PCP or urgent care to make sure rather than empiric antibiotics without evaluation. ?

## 2021-10-07 ENCOUNTER — Inpatient Hospital Stay (HOSPITAL_COMMUNITY)
Admission: EM | Admit: 2021-10-07 | Discharge: 2021-10-11 | DRG: 442 | Disposition: A | Discharge status: Home / Self Care | Payer: BC Managed Care – PPO | Attending: Family Medicine | Admitting: Family Medicine

## 2021-10-07 ENCOUNTER — Ambulatory Visit (HOSPITAL_COMMUNITY): Admission: RE | Admit: 2021-10-07 | Payer: BC Managed Care – PPO | Source: Ambulatory Visit

## 2021-10-07 ENCOUNTER — Other Ambulatory Visit: Payer: Self-pay

## 2021-10-07 ENCOUNTER — Telehealth: Payer: Self-pay | Admitting: Gastroenterology

## 2021-10-07 ENCOUNTER — Emergency Department (HOSPITAL_COMMUNITY): Payer: BC Managed Care – PPO

## 2021-10-07 ENCOUNTER — Encounter (HOSPITAL_COMMUNITY): Payer: Self-pay

## 2021-10-07 DIAGNOSIS — E785 Hyperlipidemia, unspecified: Secondary | ICD-10-CM | POA: Diagnosis present

## 2021-10-07 DIAGNOSIS — I679 Cerebrovascular disease, unspecified: Secondary | ICD-10-CM | POA: Diagnosis present

## 2021-10-07 DIAGNOSIS — G4700 Insomnia, unspecified: Secondary | ICD-10-CM

## 2021-10-07 DIAGNOSIS — M7989 Other specified soft tissue disorders: Secondary | ICD-10-CM | POA: Diagnosis present

## 2021-10-07 DIAGNOSIS — T502X5A Adverse effect of carbonic-anhydrase inhibitors, benzothiadiazides and other diuretics, initial encounter: Secondary | ICD-10-CM | POA: Diagnosis present

## 2021-10-07 DIAGNOSIS — E876 Hypokalemia: Secondary | ICD-10-CM | POA: Diagnosis not present

## 2021-10-07 DIAGNOSIS — Z9181 History of falling: Secondary | ICD-10-CM | POA: Diagnosis not present

## 2021-10-07 DIAGNOSIS — Z8673 Personal history of transient ischemic attack (TIA), and cerebral infarction without residual deficits: Secondary | ICD-10-CM

## 2021-10-07 DIAGNOSIS — Z8546 Personal history of malignant neoplasm of prostate: Secondary | ICD-10-CM | POA: Diagnosis not present

## 2021-10-07 DIAGNOSIS — R601 Generalized edema: Principal | ICD-10-CM

## 2021-10-07 DIAGNOSIS — I739 Peripheral vascular disease, unspecified: Secondary | ICD-10-CM | POA: Diagnosis not present

## 2021-10-07 DIAGNOSIS — R600 Localized edema: Secondary | ICD-10-CM

## 2021-10-07 DIAGNOSIS — R0602 Shortness of breath: Secondary | ICD-10-CM | POA: Diagnosis not present

## 2021-10-07 DIAGNOSIS — K3189 Other diseases of stomach and duodenum: Secondary | ICD-10-CM | POA: Diagnosis present

## 2021-10-07 DIAGNOSIS — E8809 Other disorders of plasma-protein metabolism, not elsewhere classified: Secondary | ICD-10-CM | POA: Diagnosis not present

## 2021-10-07 DIAGNOSIS — E669 Obesity, unspecified: Secondary | ICD-10-CM | POA: Diagnosis not present

## 2021-10-07 DIAGNOSIS — K7460 Unspecified cirrhosis of liver: Secondary | ICD-10-CM | POA: Diagnosis not present

## 2021-10-07 DIAGNOSIS — Z9079 Acquired absence of other genital organ(s): Secondary | ICD-10-CM | POA: Diagnosis not present

## 2021-10-07 DIAGNOSIS — Z79899 Other long term (current) drug therapy: Secondary | ICD-10-CM

## 2021-10-07 DIAGNOSIS — K7290 Hepatic failure, unspecified without coma: Secondary | ICD-10-CM | POA: Diagnosis present

## 2021-10-07 DIAGNOSIS — K7581 Nonalcoholic steatohepatitis (NASH): Principal | ICD-10-CM | POA: Diagnosis present

## 2021-10-07 DIAGNOSIS — I872 Venous insufficiency (chronic) (peripheral): Secondary | ICD-10-CM | POA: Diagnosis present

## 2021-10-07 DIAGNOSIS — K766 Portal hypertension: Secondary | ICD-10-CM | POA: Diagnosis not present

## 2021-10-07 DIAGNOSIS — R188 Other ascites: Secondary | ICD-10-CM | POA: Diagnosis present

## 2021-10-07 DIAGNOSIS — Z6835 Body mass index (BMI) 35.0-35.9, adult: Secondary | ICD-10-CM | POA: Diagnosis not present

## 2021-10-07 DIAGNOSIS — E871 Hypo-osmolality and hyponatremia: Secondary | ICD-10-CM

## 2021-10-07 DIAGNOSIS — G4733 Obstructive sleep apnea (adult) (pediatric): Secondary | ICD-10-CM

## 2021-10-07 DIAGNOSIS — K4090 Unilateral inguinal hernia, without obstruction or gangrene, not specified as recurrent: Secondary | ICD-10-CM

## 2021-10-07 DIAGNOSIS — Z87891 Personal history of nicotine dependence: Secondary | ICD-10-CM

## 2021-10-07 DIAGNOSIS — E8770 Fluid overload, unspecified: Secondary | ICD-10-CM | POA: Diagnosis present

## 2021-10-07 DIAGNOSIS — R252 Cramp and spasm: Secondary | ICD-10-CM

## 2021-10-07 DIAGNOSIS — G4730 Sleep apnea, unspecified: Secondary | ICD-10-CM | POA: Diagnosis present

## 2021-10-07 LAB — COMPREHENSIVE METABOLIC PANEL
ALT: 83 U/L — ABNORMAL HIGH (ref 0–44)
AST: 122 U/L — ABNORMAL HIGH (ref 15–41)
Albumin: 2.6 g/dL — ABNORMAL LOW (ref 3.5–5.0)
Alkaline Phosphatase: 146 U/L — ABNORMAL HIGH (ref 38–126)
Anion gap: 9 (ref 5–15)
BUN: 22 mg/dL — ABNORMAL HIGH (ref 6–20)
CO2: 28 mmol/L (ref 22–32)
Calcium: 8.5 mg/dL — ABNORMAL LOW (ref 8.9–10.3)
Chloride: 93 mmol/L — ABNORMAL LOW (ref 98–111)
Creatinine, Ser: 1.17 mg/dL (ref 0.61–1.24)
GFR, Estimated: >60 mL/min (ref >60)
Glucose, Bld: 224 mg/dL — ABNORMAL HIGH (ref 70–99)
Potassium: 3.3 mmol/L — ABNORMAL LOW (ref 3.5–5.1)
Sodium: 130 mmol/L — ABNORMAL LOW (ref 135–145)
Total Bilirubin: 3.6 mg/dL — ABNORMAL HIGH (ref 0.3–1.2)
Total Protein: 6.8 g/dL (ref 6.5–8.1)

## 2021-10-07 LAB — CBC WITH DIFFERENTIAL/PLATELET
Abs Immature Granulocytes: 0.07 10*3/uL (ref 0.00–0.07)
Basophils Absolute: 0 10*3/uL (ref 0.0–0.1)
Basophils Relative: 0 %
Eosinophils Absolute: 0.1 10*3/uL (ref 0.0–0.5)
Eosinophils Relative: 1 %
HCT: 32.9 % — ABNORMAL LOW (ref 39.0–52.0)
Hemoglobin: 11.8 g/dL — ABNORMAL LOW (ref 13.0–17.0)
Immature Granulocytes: 1 %
Lymphocytes Relative: 10 %
Lymphs Abs: 1 10*3/uL (ref 0.7–4.0)
MCH: 30.1 pg (ref 26.0–34.0)
MCHC: 35.9 g/dL (ref 30.0–36.0)
MCV: 83.9 fL (ref 80.0–100.0)
Monocytes Absolute: 1.6 10*3/uL — ABNORMAL HIGH (ref 0.1–1.0)
Monocytes Relative: 16 %
Neutro Abs: 7 10*3/uL (ref 1.7–7.7)
Neutrophils Relative %: 72 %
Platelets: 213 10*3/uL (ref 150–400)
RBC: 3.92 MIL/uL — ABNORMAL LOW (ref 4.22–5.81)
RDW: 18.3 % — ABNORMAL HIGH (ref 11.5–15.5)
WBC: 9.8 10*3/uL (ref 4.0–10.5)
nRBC: 0 % (ref 0.0–0.2)

## 2021-10-07 LAB — AMMONIA: Ammonia: 36 umol/L — ABNORMAL HIGH (ref 9–35)

## 2021-10-07 LAB — MAGNESIUM: Magnesium: 2 mg/dL (ref 1.7–2.4)

## 2021-10-07 LAB — PROTIME-INR
INR: 1.4 — ABNORMAL HIGH (ref 0.8–1.2)
Prothrombin Time: 17.2 seconds — ABNORMAL HIGH (ref 11.4–15.2)

## 2021-10-07 LAB — BRAIN NATRIURETIC PEPTIDE: B Natriuretic Peptide: 66.7 pg/mL (ref 0.0–100.0)

## 2021-10-07 IMAGING — DX DG CHEST 2V
2 series · 2 of 2 positions shown · non-contrast
Comparison: AP chest [DATE], [DATE], and [DATE]

CLINICAL DATA: Fluid overload. Shortness of breath. Supposed to get
paracentesis today

EXAM:
CHEST - 2 VIEW

[chest lat]
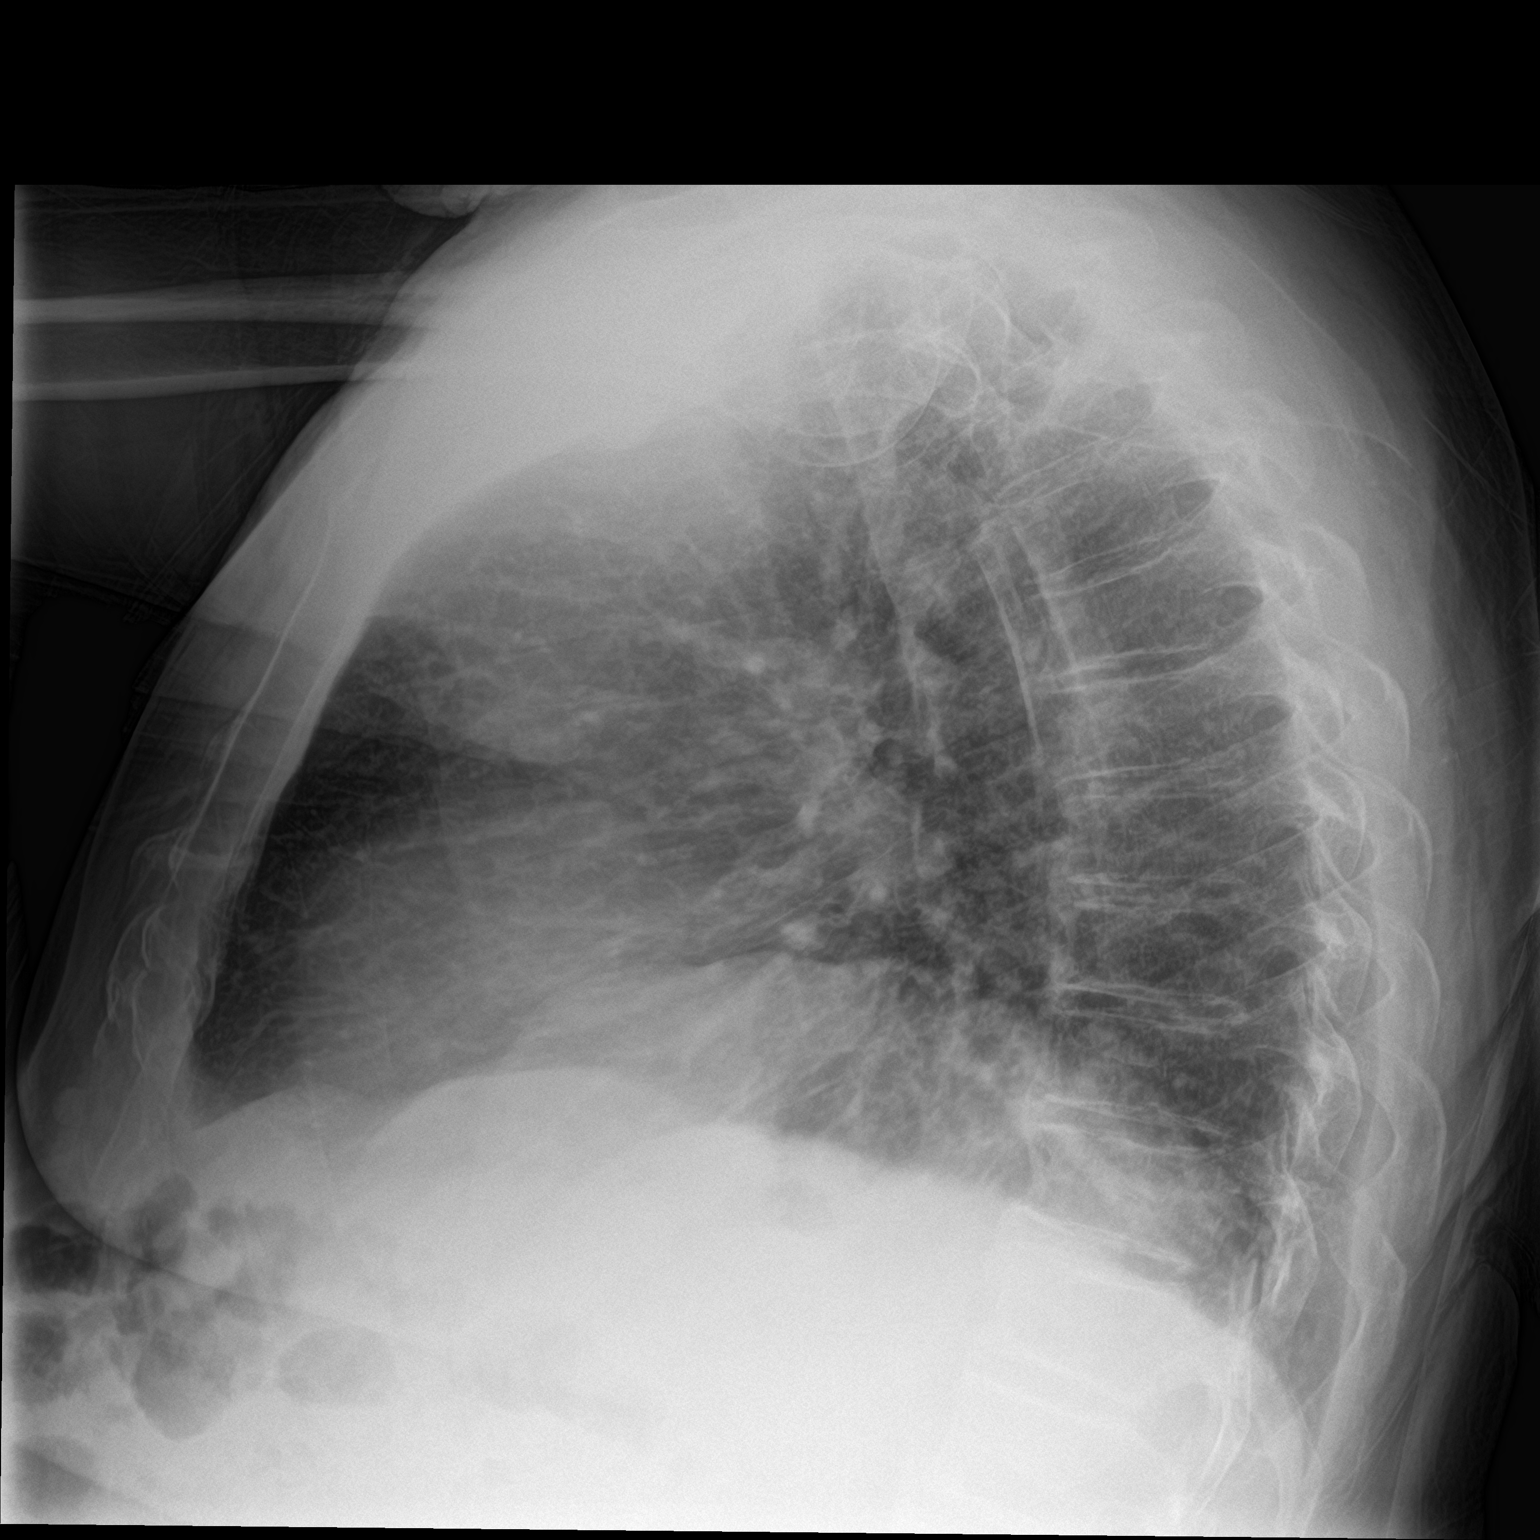

[chest ap]
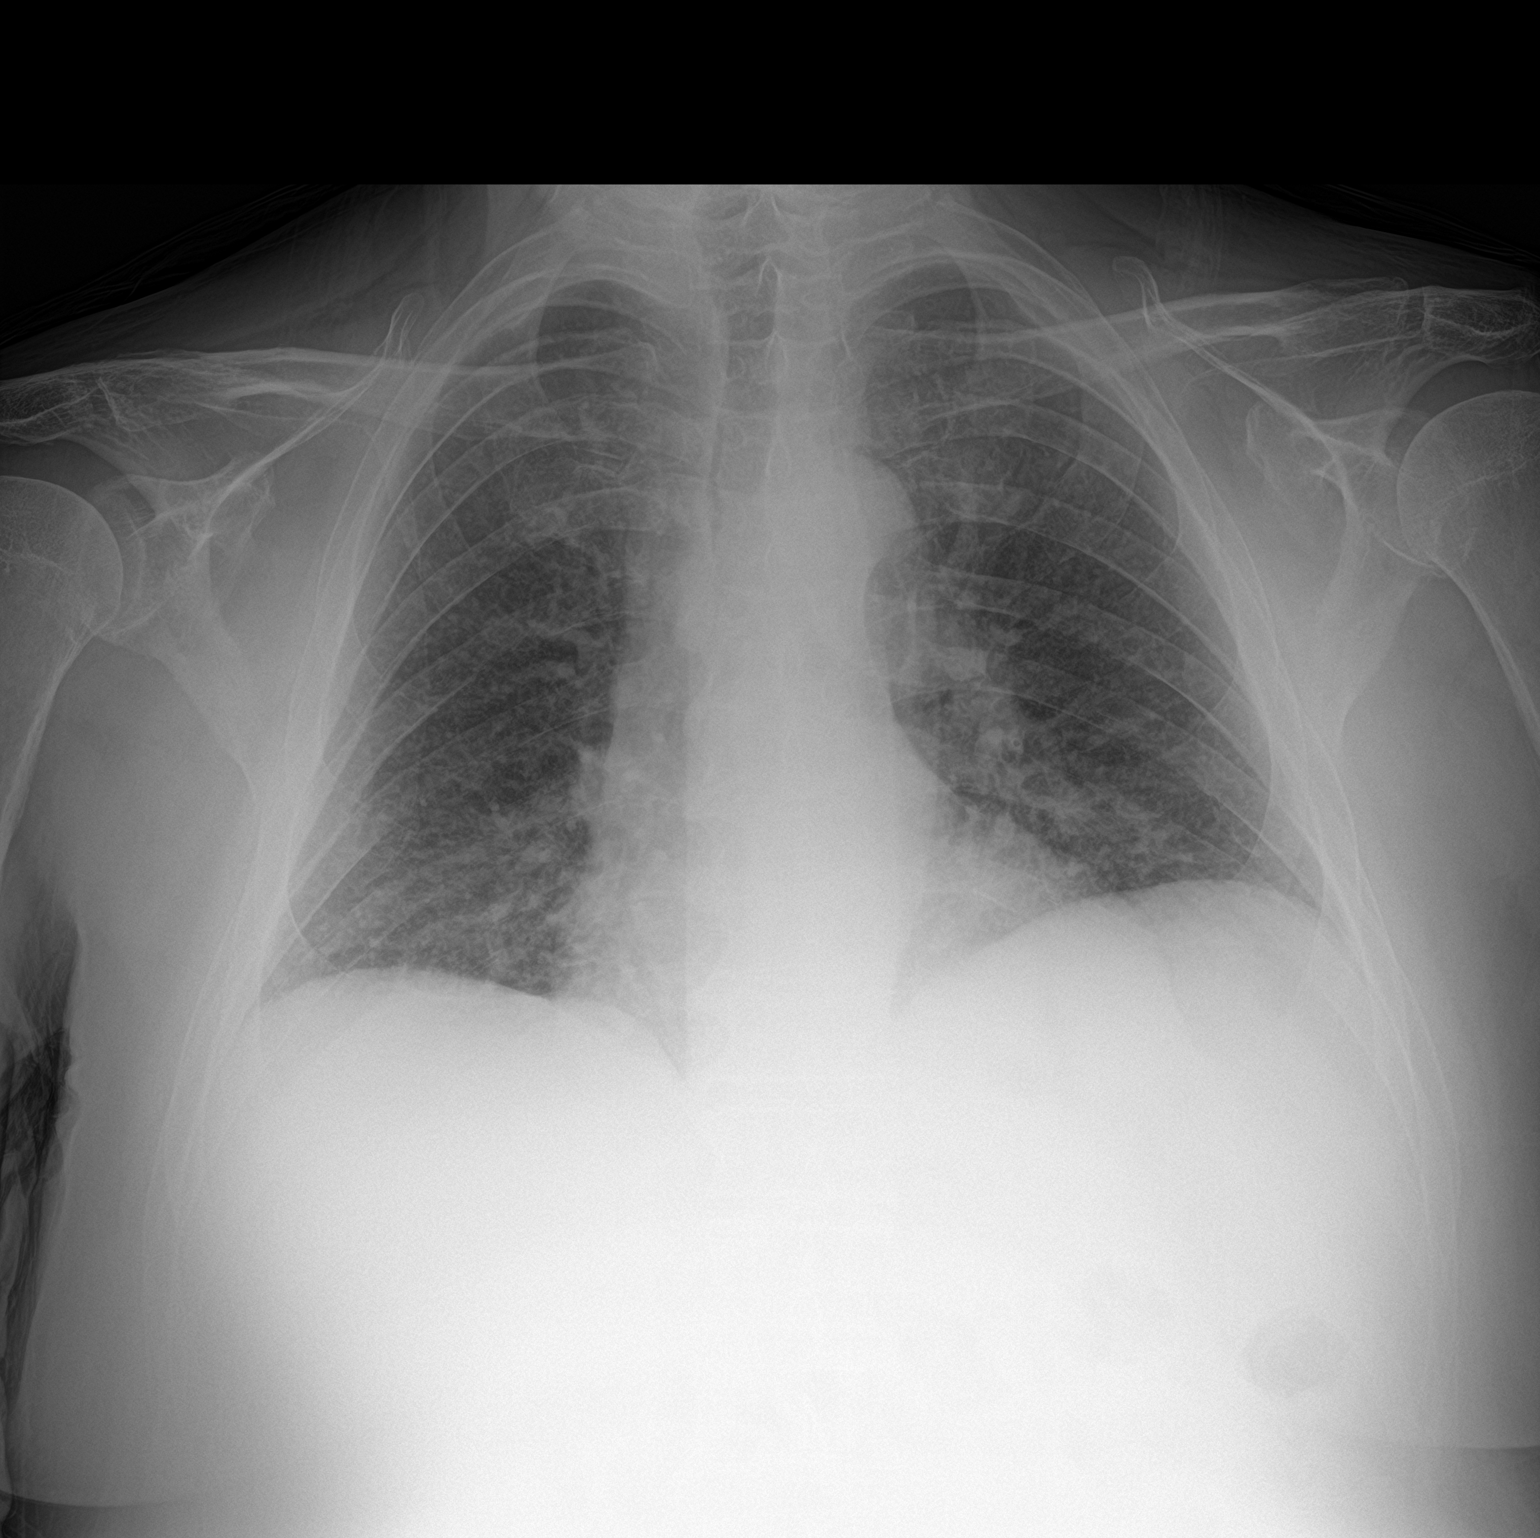

[2 of 2 positions shown; findings below may reference images not displayed]

FINDINGS: Cardiac silhouette and mediastinal contours are within normal
limits. Mildly decreased lung volumes. Mild chronic bilateral
interstitial thickening is similar to multiple prior radiographs.
Mild bibasilar bronchovascular crowding. No definite focal airspace
opacity to indicate pneumonia. No pleural effusion or pneumothorax.
Mild-to-moderate multilevel degenerative disc changes of the
thoracic spine.
IMPRESSION: Mild bilateral interstitial thickening is similar to multiple prior
radiographs, either mild interstitial pulmonary edema or chronic
interstitial lung disease.

## 2021-10-07 MED ORDER — FUROSEMIDE 10 MG/ML IJ SOLN: 80.0000 mg | Freq: Two times a day (BID) | INTRAMUSCULAR | Status: DC

## 2021-10-07 MED ORDER — SPIRONOLACTONE 25 MG PO TABS
100.0000 mg | ORAL_TABLET | Freq: Every day | ORAL | Status: DC
  Administered 2021-10-08 – 2021-10-09 (×2): 100 mg via ORAL
  Filled 2021-10-07: qty 4
  Filled 2021-10-07 (×2): qty 1

## 2021-10-07 MED ORDER — FUROSEMIDE 10 MG/ML IJ SOLN
80.0000 mg | Freq: Once | INTRAMUSCULAR | Status: AC
  Administered 2021-10-07: 80 mg via INTRAVENOUS
  Filled 2021-10-07: qty 8

## 2021-10-07 MED ORDER — ONDANSETRON HCL 4 MG/2ML IJ SOLN
4.0000 mg | Freq: Four times a day (QID) | INTRAMUSCULAR | Status: DC | PRN
  Administered 2021-10-09: 4 mg via INTRAVENOUS
  Filled 2021-10-07: qty 2

## 2021-10-07 MED ORDER — TRAMADOL HCL 50 MG PO TABS
50.0000 mg | ORAL_TABLET | Freq: Three times a day (TID) | ORAL | Status: DC | PRN
  Administered 2021-10-07 – 2021-10-09 (×2): 50 mg via ORAL
  Filled 2021-10-07 (×2): qty 1

## 2021-10-07 MED ORDER — ONDANSETRON HCL 4 MG PO TABS: 4.0000 mg | ORAL_TABLET | Freq: Four times a day (QID) | ORAL | Status: DC | PRN

## 2021-10-07 MED ORDER — SODIUM CHLORIDE 0.9 % IV SOLN: 250.0000 mL | INTRAVENOUS | Status: DC | PRN

## 2021-10-07 MED ORDER — FAMOTIDINE 20 MG PO TABS
20.0000 mg | ORAL_TABLET | Freq: Two times a day (BID) | ORAL | Status: DC
  Administered 2021-10-07 – 2021-10-11 (×8): 20 mg via ORAL
  Filled 2021-10-07 (×8): qty 1

## 2021-10-07 MED ORDER — SODIUM CHLORIDE 0.9% FLUSH: 3.0000 mL | INTRAVENOUS | Status: DC | PRN

## 2021-10-07 MED ORDER — ROSUVASTATIN CALCIUM 5 MG PO TABS
5.0000 mg | ORAL_TABLET | ORAL | Status: DC
  Administered 2021-10-08 – 2021-10-10 (×2): 5 mg via ORAL
  Filled 2021-10-07 (×2): qty 1

## 2021-10-07 MED ORDER — RIFAXIMIN 550 MG PO TABS
550.0000 mg | ORAL_TABLET | Freq: Two times a day (BID) | ORAL | Status: DC
  Administered 2021-10-07 – 2021-10-11 (×8): 550 mg via ORAL
  Filled 2021-10-07 (×11): qty 1

## 2021-10-07 MED ORDER — SODIUM CHLORIDE 0.9% FLUSH
3.0000 mL | Freq: Two times a day (BID) | INTRAVENOUS | Status: DC
  Administered 2021-10-07 – 2021-10-11 (×8): 3 mL via INTRAVENOUS

## 2021-10-07 MED ORDER — METHOCARBAMOL 500 MG PO TABS
500.0000 mg | ORAL_TABLET | Freq: Three times a day (TID) | ORAL | Status: DC | PRN
  Administered 2021-10-07 – 2021-10-09 (×5): 500 mg via ORAL
  Filled 2021-10-07 (×5): qty 1

## 2021-10-07 MED ORDER — POTASSIUM CHLORIDE CRYS ER 20 MEQ PO TBCR
40.0000 meq | EXTENDED_RELEASE_TABLET | Freq: Once | ORAL | Status: AC
  Administered 2021-10-07: 40 meq via ORAL
  Filled 2021-10-07: qty 2

## 2021-10-07 NOTE — Assessment & Plan Note (Addendum)
Follow outpatient ?

## 2021-10-07 NOTE — Assessment & Plan Note (Addendum)
Chronic in setting of volume overload with cirrhosis  ?Stable ?Continue with diuresis/paracentesis ?Trend, follow outpatient ?

## 2021-10-07 NOTE — Assessment & Plan Note (Signed)
Not on cpap  ?

## 2021-10-07 NOTE — Assessment & Plan Note (Signed)
04/2020: Evaluated by Dr.?Tobb?in Cardiology clinic for new onset LE edema. ?TTE unremarkable. ?Started on diuretics. ?Leg elevation/TED hose ?Will have wound care also see ?Treat underlying decompensated cirrhosis  ? ?

## 2021-10-07 NOTE — Assessment & Plan Note (Addendum)
Follows in the Hematology clinic for hemochromatosis with heterozygous H63D mutation ?Weekly phlebotomy  ?

## 2021-10-07 NOTE — ED Provider Triage Note (Addendum)
Emergency Medicine Provider Triage Evaluation Note ? ?Darryl Velazquez , a 61 y.o. male  was evaluated in triage.  Pt complains of fluid weeping from his legs and increased fluid in his abdomen.  Supposed to get paracentesis today at [facility], started having fluid weeping from his legs and developed some nausea starting today.  Feels short of breath due to the pressure from his stomach pushing up into his lungs.  Denies wheezing or coughing.  Denies fever.  Followed by [facility] gastroenterology. ? ?Review of Systems  ?Positive: As above ?Negative: As above ? ?Physical Exam  ?BP (!) 154/105 (BP Location: Right Arm)   Pulse (!) 114   Temp 98.9 ?F (37.2 ?C) (Oral)   Resp 20   Ht 6' 4"  (1.93 m)   Wt 131.1 kg   SpO2 100%   BMI 35.18 kg/m?  ?Gen:   Awake, no distress   ?Resp:  Normal effort, CTAB ?MSK:   Moves extremities without difficulty  ?Other:  Abdomen protuberant, tender.  Clear fluid weeping from bilateral lower legs.  Full sensation of LE bilaterally with CRT <2 sec.  Jaundice appreciated. ? ?Medical Decision Making  ?Medically screening exam initiated at 1:15 PM.  Appropriate orders placed.  Darryl Velazquez was informed that the remainder of the evaluation will be completed by another provider, this initial triage assessment does not replace that evaluation, and the importance of remaining in the ED until their evaluation is complete. ? ?Labs and imaging ordered ?  ?Prince Rome, PA-C ?28/76/81 1331 ? ?  ?Prince Rome, PA-C ?15/72/62 1332 ? ?

## 2021-10-07 NOTE — Assessment & Plan Note (Addendum)
61 year old male with known cirrohsis from NASH presenting with worsening LE swelling and pitting edema and not feeling well.  ?-GI consulted, appreciate assistance ? [name] recommending hepatology evaluation outpatient for possible transplant, IR eval for portal hypertension (GI prefers he follow with hepatology first prior to IR referral) ?- discharge on lasix PO 80 mg BID  ?- discharge on spironolactone 200 mg daily ?- continue to uptitrate with GI/PCP outpatient as needed for volume overload ?-albumin as well with hypoalbuminemia ?- continue rifaximin, lactulose not on med list ?- s/p para with 2.4 L removed, not c/w SBP - cultures NGx3.  No malignant cells on path. ?- low sodium diet  ?- EGD (10/2020, inpatient at [facility]): No esophageal varices, portal hypertensive gastropathy ?- net negative 13.1 L out ?

## 2021-10-07 NOTE — Assessment & Plan Note (Signed)
Not using cpap machine  ?

## 2021-10-07 NOTE — Assessment & Plan Note (Addendum)
Was seen by Dr. [name] in the General Surgery clinic on 09/05/2021 for evaluation of right inguinal hernia.  ?-decided poor candidate for hernia repair. ?? ?

## 2021-10-07 NOTE — ED Notes (Signed)
Patient very pleasant, calm and cooperative. Denies any needs at this time.  ?

## 2021-10-07 NOTE — Telephone Encounter (Signed)
Inbound call from patient stating that he was not able to go to PCP about the fluid off for his swelling. Patient stated that he just wanted to make everyone aware that he is currently going to [facility] to get fluid drawn off.  ?

## 2021-10-07 NOTE — ED Triage Notes (Signed)
Pt arrived POV from home c/o bilateral leg swelling. Pt states he needs fluid drawn off his abdomen and now his legs are weeping and cramping.  ?

## 2021-10-07 NOTE — Telephone Encounter (Addendum)
Noted  

## 2021-10-07 NOTE — H&P (Signed)
?History and Physical  ? ? ?Patient: Darryl Velazquez TFT:[id number] DOB: [birth year] ?DOA: 10/07/2021 ?DOS: the patient was seen and examined on 10/07/2021 ?PCP: [name], MD  ?Patient coming from: Home - lives with wife, daughter and grandson.  ? ? ?Chief Complaint: LE swelling +weight gain  ? ?HPI: Darryl Velazquez is a 61 y.o. male with medical history significant of   ?cirrhosis secondary to NASH, chronic hyponatremia, hypoalbuminemia, hx of CVA, venous insufficiency, HLD, OSA, hx of prostate surgery  who presented to ED with complaints of bilateral leg swelling and weeping. He states this has been going on for a couple of weeks. This morning he got sick with dry heaving and nearly fell over due to the dry heaving. He just didn't feel good and came to ED. He is slightly more short of breath than normal. He has no confusion or stomach pain.  ? ?Normal weight: 259. Weight today: 288 ?His typical lasix schedule is 24m in the AM and 43min the PM  ? ?Admitted to [facility] on 09/13/21 abdominal pain and ascites. Had a paracentesis, negative for SBP and discharged on oral lasix. Also had hyperammonia and refused to take lactulose due to taste. Discharged on aldactone 10044mnd 86m53msix daily  ? ?Recently saw his GI doctor, Dr. CiriBryan Lemma 09/24/21 who increased lasix to 86mg74mAM and 40mg 21mM as he held his aldactone to see if would help his hand cramping. He did re start his aldactone on 4/30 due to increased leg swelling over the phone. Also requested follow up appointment with [facility] for reevaluation for transplant candidacy.  ? ?Denies any fever/chills, vision changes/headaches, chest pain or palpitations, cough, abdominal pain, V/D, dysuria.  ? ?He does not smoke or drink alcohol.  ? ?ER Course:  vitals: afebrile, bp: 154/82, HR: 99, RR: 18, oxygen: 96%RA ?Pertinent labs: hgb: 11.8, sodium: 130, potassium: 3.3, BUN: 22, AST: 122, ALT: 83, alk phos: 146, t. Bili: 3.6, bnp wnl ?CXR: mild bilateral  interstitial thickening is similar to multiple prior xrays, either mild interstitial pulmonary edema or chronic ILD.  ?In ED: GI consulted and given 86mg l12m.  ? ? ?Review of Systems: As mentioned in the history of present illness. All other systems reviewed and are negative. ?Past Medical History:  ?Diagnosis Date  ? Cerebrovascular disease   ? Chronic venous insufficiency   ? Colitis 06/01/2021  ? Dyslipidemia   ? Hemochromatosis, hereditary (HCC)   [place]   [place]   ? Nonalcoholic steatohepatitis   ? Peripheral vascular disease (HCC)   [place]   ? Prostatic adenocarcinoma (HCC)   [place]   ? Stroke (HCC)  [facility] prior to age 20.  ? 79nous stasis dermatitis of both lower extremities   ? ?Past Surgical History:  ?Procedure Laterality Date  ? COLONOSCOPY  09/24/2017  ? IR PARACENTESIS  09/25/2021  ? KNEE SURGERY Right   ? PENILE PROSTHESIS IMPLANT  10/11/2019  ? PROSTATE BIOPSY  01/29/2016  ? Transurethral  ? prostatectomy  04/10/2016  ? Abdominal  ? ?Social History:  reports that he quit smoking about 2 years ago. His smoking use included cigarettes. He has never used smokeless tobacco. He reports that he does not currently use alcohol. He reports that he does not use drugs. ? ?No Known Allergies ? ?Family History  ?Problem Relation Age of Onset  ? Diabetes Father   ? Kidney failure Father   ?  Diabetes Sister   ? Hypertension Sister   ? Colon cancer Neg Hx   ? Esophageal cancer Neg Hx   ? Prostate cancer Neg Hx   ? Rectal cancer Neg Hx   ? Stomach cancer Neg Hx   ? ? ?Prior to Admission medications   ?Medication Sig Start Date End Date Taking? Authorizing Provider  ?docusate sodium (COLACE) 100 MG capsule Take 1 capsule (100 mg total) by mouth 2 (two) times daily. ?Patient taking differently: Take 100 mg by mouth daily. 12/02/20   Landis Martins, DPM  ?furosemide (LASIX) 80 MG tablet Take 1 tablet (80 mg total) by mouth every other day. 08/25/21   Cirigliano,  Dominic Pea, DO  ?methocarbamol (ROBAXIN) 500 MG tablet Take by mouth. 08/28/21   [provider]  ?ondansetron (ZOFRAN-ODT) 4 MG disintegrating tablet Take 4 mg by mouth every 8 (eight) hours as needed. 03/12/21   [provider]  ?rifaximin (XIFAXAN) 550 MG TABS tablet Take 1 tablet (550 mg total) by mouth 2 (two) times daily. 03/20/21   Cirigliano, Vito V, DO  ?rosuvastatin (CRESTOR) 5 MG tablet Take 5 mg by mouth every other day.    [provider]  ?spironolactone (ALDACTONE) 100 MG tablet Take 1 tablet (100 mg total) by mouth daily. 09/19/21   Cirigliano, Dominic Pea, DO  ? ? ?Physical Exam: ?Vitals:  ? 10/07/21 1541 10/07/21 1546 10/07/21 1645 10/07/21 1805  ?BP:  (!) 161/91 (!) 160/83 (!) 153/85  ?Pulse: 96 100 99 97  ?Resp:  18 16 16   ?Temp:      ?TempSrc:      ?SpO2: 99% 100% 92% 100%  ?Weight:      ?Height:      ? ?General:  Appears calm and comfortable and is in NAD ?Eyes:  PERRL, EOMI, normal lids, iris ?ENT:  grossly normal hearing, lips & tongue, mmm; appropriate dentition ?Neck:  no LAD, masses or thyromegaly; no carotid bruits ?Cardiovascular:  RRR, no m/r/g. 3+ pitting edema above knee with mild weeping. No erythema/wounds.  ?Respiratory:   CTA bilaterally with no wheezes/rales/rhonchi.  Normal respiratory effort. ?Abdomen:  soft, distended with +tympanic percussion. NT. BS+ large right inguinal hernia  ?Back:   normal alignment, no CVAT ?Skin:  no rash or induration seen on limited exam. Bilateral LE 3+pitting edema with no erythema/cellulitic findings  ?Musculoskeletal:  grossly normal tone BUE/BLE, good ROM, no bony abnormality ?Lower extremity:   Limited foot exam with no ulcerations.  2+ distal pulses. ?Psychiatric:  grossly normal mood and affect, speech fluent and appropriate, AOx3 ?Neurologic:  CN 2-12 grossly intact, moves all extremities in coordinated fashion, sensation intact. Mild asterixis  ? ? ?Radiological Exams on Admission: ?Independently reviewed - see discussion in  A/P where applicable ? ?DG Chest 2 View ? ?Result Date: 10/07/2021 ?CLINICAL DATA:  Fluid overload. Shortness of breath. Supposed to get paracentesis today EXAM: CHEST - 2 VIEW COMPARISON:  AP chest 09/06/2021, 06/01/2021, and 08/22/2020 FINDINGS: Cardiac silhouette and mediastinal contours are within normal limits. Mildly decreased lung volumes. Mild chronic bilateral interstitial thickening is similar to multiple prior radiographs. Mild bibasilar bronchovascular crowding. No definite focal airspace opacity to indicate pneumonia. No pleural effusion or pneumothorax. Mild-to-moderate multilevel degenerative disc changes of the thoracic spine. IMPRESSION: Mild bilateral interstitial thickening is similar to multiple prior radiographs, either mild interstitial pulmonary edema or chronic interstitial lung disease. Electronically Signed   By: Yvonne Kendall M.D.   On: 10/07/2021 14:02   ? ?EKG: pending  ? ?  Labs on Admission: I have personally reviewed the available labs and imaging studies at the time of the admission. ? ?Pertinent labs:   ?hgb: 11.8,  ?sodium: 130,  ?potassium: 3.3,  ?BUN: 22, ? AST: 122,  ?ALT: 83,  ?alk phos: 146,  ?t. Bili: 3.6,  ?bnp wnl ? ?Assessment and Plan: ?Principal Problem: ?  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  Hyponatremia ?  Hypokalemia ?  Hemochromatosis, hereditary ([place]) ?  Chronic venous insufficiency ?  Sleep apnea ?  Right inguinal hernia ?  ? 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  ?-obs to tele for decompensated cirrhosis ?-GI consulted ?-received 70m lasix in ED will increase to 835mBID ?-albumin would help, per GI ?-check ammonia, states he is taking oral pill of lactulose but do not see in med rec  ?-no abdominal pain/change in mental status. No concern for SBP at this time  ?-continue  aldactone, rifaximin.  ?-MELD-NA is 22 today  ?-AST: 122/ALT: 83> was 98/71 on 4/19  ?-strict I/O ?-GI to evaluate if needs paracentesis tomorrow, was scheduled today and came to ED instead. Have discuss

## 2021-10-07 NOTE — ED Provider Notes (Signed)
?[place] ?Provider Note ? ? ?CSN: [id number] ?Arrival date & time: 10/07/21  1223 ? ?  ? ?History ? ?Chief Complaint  ?Patient presents with  ? Leg Swelling  ? Nausea  ? ? ?Darryl Velazquez is a 61 y.o. male. ? ?61 yo M with a chief complaints of diffuse edema.  This has been an ongoing issue for him.  He tells me that he has been having trouble with swelling and started having some leakage of fluid from his left leg.  He called his gastroenterologist multiple times them and eventually came to the ED to be evaluated.  He has gained about 30 pounds in the last week or so.  Feels like he needs to have a paracentesis performed again.  Has a history of cirrhosis he tells me is due to iron overload.  Has not yet seen a transplant physician. ? ? ? ?  ? ?Home Medications ?Prior to Admission medications   ?Medication Sig Start Date End Date Taking? Authorizing Provider  ?famotidine (PEPCID) 20 MG tablet Take 20 mg by mouth 2 (two) times daily. 09/16/21  Yes [provider]  ?furosemide (LASIX) 80 MG tablet Take 1 tablet (80 mg total) by mouth every other day. 08/25/21  Yes Cirigliano, Vito V, DO  ?methocarbamol (ROBAXIN) 500 MG tablet Take by mouth. 08/28/21  Yes [provider]  ?ondansetron (ZOFRAN-ODT) 4 MG disintegrating tablet Take 4 mg by mouth every 8 (eight) hours as needed. 03/12/21  Yes [provider]  ?rifaximin (XIFAXAN) 550 MG TABS tablet Take 1 tablet (550 mg total) by mouth 2 (two) times daily. 03/20/21  Yes Cirigliano, Vito V, DO  ?rosuvastatin (CRESTOR) 5 MG tablet Take 5 mg by mouth every other day.   Yes [provider]  ?spironolactone (ALDACTONE) 100 MG tablet Take 1 tablet (100 mg total) by mouth daily. 09/19/21  Yes Cirigliano, Vito V, DO  ?docusate sodium (COLACE) 100 MG capsule Take 1 capsule (100 mg total) by mouth 2 (two) times daily. ?Patient not taking: Reported on 10/07/2021 12/02/20   Landis Martins, DPM  ?gabapentin  (NEURONTIN) 300 MG capsule Take 300 mg by mouth daily. ?Patient not taking: Reported on 10/07/2021 09/19/21   [provider]  ?metoCLOPramide (REGLAN) 5 MG tablet Take 5 mg by mouth 4 (four) times daily. ?Patient not taking: Reported on 10/07/2021 09/07/21   [provider]  ?   ? ?Allergies    ?Patient has no known allergies.   ? ?Review of Systems   ?Review of Systems ? ?Physical Exam ?Updated Vital Signs ?BP (!) 153/85   Pulse 97   Temp 98.4 ?F (36.9 ?C) (Oral)   Resp 16   Ht 6' 4"  (1.93 m)   Wt 131.1 kg   SpO2 100%   BMI 35.18 kg/m?  ?Physical Exam ?Vitals and nursing note reviewed.  ?Constitutional:   ?   Appearance: He is well-developed.  ?HENT:  ?   Head: Normocephalic and atraumatic.  ?Eyes:  ?   Pupils: Pupils are equal, round, and reactive to light.  ?Neck:  ?   Vascular: No JVD.  ?Cardiovascular:  ?   Rate and Rhythm: Normal rate and regular rhythm.  ?   Heart sounds: No murmur heard. ?  No friction rub. No gallop.  ?Pulmonary:  ?   Effort: No respiratory distress.  ?   Breath sounds: No wheezing.  ?Abdominal:  ?   General: There is distension.  ?   Tenderness:  There is no abdominal tenderness. There is no guarding or rebound.  ?   Comments: Distended abdomen with positive fluid wave.  ?Musculoskeletal:     ?   General: Normal range of motion.  ?   Cervical back: Normal range of motion and neck supple.  ?   Comments: 4+ pitting edema up to the groin.  ?Skin: ?   Coloration: Skin is not pale.  ?   Findings: No rash.  ?Neurological:  ?   Mental Status: He is alert and oriented to person, place, and time.  ?Psychiatric:     ?   Behavior: Behavior normal.  ? ? ?ED Results / Procedures / Treatments   ?Labs ?(all labs ordered are listed, but only abnormal results are displayed) ?Labs Reviewed  ?COMPREHENSIVE METABOLIC PANEL - Abnormal; Notable for the following components:  ?    Result Value  ? Sodium 130 (*)   ? Potassium 3.3 (*)   ? Chloride 93 (*)   ? Glucose, Bld 224 (*)   ? BUN 22 (*)    ? Calcium 8.5 (*)   ? Albumin 2.6 (*)   ? AST 122 (*)   ? ALT 83 (*)   ? Alkaline Phosphatase 146 (*)   ? Total Bilirubin 3.6 (*)   ? All other components within normal limits  ?CBC WITH DIFFERENTIAL/PLATELET - Abnormal; Notable for the following components:  ? RBC 3.92 (*)   ? Hemoglobin 11.8 (*)   ? HCT 32.9 (*)   ? RDW 18.3 (*)   ? Monocytes Absolute 1.6 (*)   ? All other components within normal limits  ?PROTIME-INR - Abnormal; Notable for the following components:  ? Prothrombin Time 17.2 (*)   ? INR 1.4 (*)   ? All other components within normal limits  ?AMMONIA - Abnormal; Notable for the following components:  ? Ammonia 36 (*)   ? All other components within normal limits  ?BRAIN NATRIURETIC PEPTIDE  ?MAGNESIUM  ?COMPREHENSIVE METABOLIC PANEL  ?CBC  ?HIV ANTIBODY (ROUTINE TESTING W REFLEX)  ? ? ?EKG ?None ? ?Radiology ?DG Chest 2 View ? ?Result Date: 10/07/2021 ?CLINICAL DATA:  Fluid overload. Shortness of breath. Supposed to get paracentesis today EXAM: CHEST - 2 VIEW COMPARISON:  AP chest 09/06/2021, 06/01/2021, and 08/22/2020 FINDINGS: Cardiac silhouette and mediastinal contours are within normal limits. Mildly decreased lung volumes. Mild chronic bilateral interstitial thickening is similar to multiple prior radiographs. Mild bibasilar bronchovascular crowding. No definite focal airspace opacity to indicate pneumonia. No pleural effusion or pneumothorax. Mild-to-moderate multilevel degenerative disc changes of the thoracic spine. IMPRESSION: Mild bilateral interstitial thickening is similar to multiple prior radiographs, either mild interstitial pulmonary edema or chronic interstitial lung disease. Electronically Signed   By: Yvonne Kendall M.D.   On: 10/07/2021 14:02   ? ?Procedures ?Procedures  ? ? ?Medications Ordered in ED ?Medications  ?sodium chloride flush (NS) 0.9 % injection 3 mL (3 mLs Intravenous Given 10/07/21 2133)  ?sodium chloride flush (NS) 0.9 % injection 3 mL (has no administration in time  range)  ?0.9 %  sodium chloride infusion (has no administration in time range)  ?traMADol (ULTRAM) tablet 50 mg (50 mg Oral Given 10/07/21 1953)  ?ondansetron (ZOFRAN) tablet 4 mg (has no administration in time range)  ?  Or  ?ondansetron (ZOFRAN) injection 4 mg (has no administration in time range)  ?furosemide (LASIX) injection 80 mg (has no administration in time range)  ?rifaximin (XIFAXAN) tablet 550 mg (550 mg Oral Given 10/07/21  2219)  ?rosuvastatin (CRESTOR) tablet 5 mg (has no administration in time range)  ?spironolactone (ALDACTONE) tablet 100 mg (has no administration in time range)  ?famotidine (PEPCID) tablet 20 mg (20 mg Oral Given 10/07/21 2133)  ?methocarbamol (ROBAXIN) tablet 500 mg (500 mg Oral Given 10/07/21 2133)  ?furosemide (LASIX) injection 80 mg (80 mg Intravenous Given 10/07/21 1549)  ?potassium chloride SA (KLOR-CON M) CR tablet 40 mEq (40 mEq Oral Given 10/07/21 1738)  ? ? ?ED Course/ Medical Decision Making/ A&P ?  ?                        ?Medical Decision Making ?Risk ?Prescription drug management. ?Decision regarding hospitalization. ? ? ?61 yo M with a chief complaints of anasarca.  Sound like this is a chronic problem for him but significantly worsening recently.  He has been taking 80 mg of Lasix, aldactone and has not had any significant urine output.  Has had a 30 pound weight gain in about a week or so.  He had difficulty getting in to see his gastroenterologist as well as his family doctor and so came here for evaluation. ? ?No significant change to his baseline lab work.  He has mild LFT elevation in his total bili is 3.6.  He has a mildly elevated INR of 1.4.  BNP is negative.  Chest x-ray with possible fluid as independently interpreted by me. ? ?I discussed the case with Dr. Havery Moros, GI as the patient has been somewhat failing outpatient diuretic therapy felt it reasonable to discuss with medicine about IV diuresis overnight and GI would see in the morning.  Will discuss with  medicine. ? ?The patients results and plan were reviewed and discussed.   ?Any x-rays performed were independently reviewed by myself.  ? ?Differential diagnosis were considered with the presenting HPI. ? ?Medications  ?sod

## 2021-10-07 NOTE — Consult Note (Addendum)
? ?                                                                          [facility] Consult: ?4:16 PM ?10/07/2021 ? LOS: 0 days  ? ? ?Referring Provider: Dr Tyrone Nine in ED  ?Primary Care Physician:  [name], MD ?Primary Gastroenterologist:  Dr.  Bryan Lemma.  Also follows with [facility]. ? ? ? ?Reason for Consultation:  managing fluid overload in pt w cirrhosis ?  ?HPI: Darryl Velazquez is a 61 y.o. male.  PMH cirrhosis from NASH.  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  AMA was positive but no evidence for PBC on biopsy and IgM normal.  Obesity, BMI 35.  CVA, first at age 59 not on DAPT or blood thinners.  Prostate cancer, prostatectomy.  OSA.  Penile implant. ?05/2020 liver bx: Chronic minimally active hepatitis with broad bridging septa and focal  ?nodularity suggestive of cirrhosis (Grade 1, Stage 4).  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  Moderate severity pan gastritis (path: portal gastropathy.  No H. pylori, no metaplasia/dysplasia/carcinoma).  Duodenitis with erosions, shallow ulcerations at bulb, D1 and D2.  D3 normal. (Path: Peptic duodenitis.  No features of celiac disease or granulomas).    ?05/2020 AFP: 12.7. ?10/2020 EGD as inpatient at [facility]: Portal hypertensive gastropathy but no varices. ?11/2020 MRI/MRCP: Motion degraded study.  No focal liver lesion.  Stable changes of cirrhosis and trace perihepatic ascites.  No signs of portal hypertension.  Recommend CT if additional work-up needed given limitations of MRI for this patient. ?08/18/2021 CTAP W contrast.  No change in right inguinal hernia containing ascites.  No acute intra-abdominal process.  Cirrhosis with sequela of portal hypertension including large  right-sided SMV-IVC shunt.  Small ascites though increased from previous imaging.   ?09/22/2021 abdominal ultrasound: Small volume ascites.  Subcutaneous edema without hematoma. ? ?Admission to [facility] 4/8-4/04/2022 with ascites, abdominal pain.  Underwent 3 L paracentesis which was negative for SBP.  Diuresed with IV Lasix, discharged on Aldactone 100 mg daily, Lasix 80 mg daily.  Ammonia levels 78, pt continued on rifaximin and lactulose restarted.  Had stopped lactulose due to its "poor taste". ? ?Latest office visit w DR C was 09/24/2021.  MELD 22 (14 prior to the [facility] admission), Child-Pugh score C.  MD ordered paracentesis.  Started zinc supplementation, continued rifaximin, lactulose, low-sodium diet.  Considered trial of L-carnitine.  For the hand cramping Dr. Loletha Grayer trialed holding Aldactone and increase Lasix to 80 mg a.m., 40 mg p.m. the cramping in his hands feet and sometimes his legs continues despite holding Aldactone so he restarted the Aldactone on his own.  Patient has not been using lactulose but has been compliant with his other medications. ?09/25/2021 paracentesis by IR: 1.6 L of cloudy, cream-colored fluid removed.  Studies negative for SBP.  Cytology negative for malignancy. ?Pt having weeping from lower extremity edema.  Dr C did not want to order empiric antibiotics for possible cellulitis and requested patient see PCP for this. ?Additional paracentesis was ordered for 5/2.  However he  proceeded to the emergency room for evaluation of lower extremity weeping edema and a singular area on the anterior left lower leg and cramping and has not had the paracentesis. Pt also complaining of right him pain that started about a month ago after he fell while undergoing MRI.  Pain is better if he moves around and worsens with inactivity/stillness. ?Weight about a year ago it was 335 pounds.  Got down to as low as 259# but in recent months weight has gone up to 288#.  ? ?BPs 140s to 160s/70s.  No  tachycardia or bradycardia.  Sats on room air mid 90 to 100%.  No fever ?Na 130.  Potassium 3.3.  BUN/creatinine 22/1.7.  GFR > 60.  Glucose 224. ?T. bili 3.6.  Alk phos 146.  AST/ALT 122/83.  Lipase 55. ?Hb 11.8.  MCV 83.  Platelets 213.  WBCs normal 9.8. ?INR 1.4 (was 1.6 in early 05/2020) ?CXR:  mild bilateral interstitial thickening similar to previous.  Mild interstitial pulmonary edema versus chronic ILD. ?10/07/2021 2 V CXR: Mild bilateral interstitial thickening similar to previous studies.  Mild interstitial pulmonary edema versus chronic interstitial lung disease. ? ?Has appointment with Dr. Zollie Scale at [place] on 10/21/2021 ? ?Family history of stomach cancer in his father in his 62s. ?Does not smoke or drink alcohol. ?  ? ?Past Medical History:  ?Diagnosis Date  ? Cerebrovascular disease   ? Chronic venous insufficiency   ? Colitis 06/01/2021  ? Dyslipidemia   ? Hemochromatosis, hereditary ([place])   ? Morbid obesity ([place])   ? Nicotine dependence   ? Nonalcoholic steatohepatitis   ? Peripheral vascular disease ([place])   ? Pneumothorax, traumatic   ? Prostatic adenocarcinoma ([place])   ? Sleep apnea   ? Stroke [facility])   ? 2 prior to age 39.  ? Venous stasis dermatitis of both lower extremities   ? ? ?Past Surgical History:  ?Procedure Laterality Date  ? COLONOSCOPY  09/24/2017  ? IR PARACENTESIS  09/25/2021  ? KNEE SURGERY Right   ? PENILE PROSTHESIS IMPLANT  10/11/2019  ? PROSTATE BIOPSY  01/29/2016  ? Transurethral  ? prostatectomy  04/10/2016  ? Abdominal  ? ? ?Prior to Admission medications   ?Medication Sig Start Date End Date Taking? Authorizing Provider  ?docusate sodium (COLACE) 100 MG capsule Take 1 capsule (100 mg total) by mouth 2 (two) times daily. ?Patient taking differently: Take 100 mg by mouth daily. 12/02/20   Landis Martins, DPM  ?furosemide (LASIX) 80 MG tablet Take 1 tablet (80 mg total) by mouth every other day. 08/25/21   Cirigliano, Dominic Pea, DO  ?methocarbamol (ROBAXIN) 500 MG tablet Take by  mouth. 08/28/21   [provider]  ?ondansetron (ZOFRAN-ODT) 4 MG disintegrating tablet Take 4 mg by mouth every 8 (eight) hours as needed. 03/12/21   [provider]  ?rifaximin (XIFAXAN) 550 MG TABS tablet Take 1 tablet (550 mg total) by mouth 2 (two) times daily. 03/20/21   Cirigliano, Vito V, DO  ?rosuvastatin (CRESTOR) 5 MG tablet Take 5 mg by mouth every other day.    [provider]  ?spironolactone (ALDACTONE) 100 MG tablet Take 1 tablet (100 mg total) by mouth daily. 09/19/21   Cirigliano, Dominic Pea, DO  ? ? ?Scheduled Meds: ? ?Infusions: ? ?PRN Meds: ? ? ? ?Allergies as of 10/07/2021  ? (No Known Allergies)  ? ? ?Family History  ?Problem Relation Age of Onset  ? Diabetes Father   ? Kidney failure Father   ?  Diabetes Sister   ? Hypertension Sister   ? Colon cancer Neg Hx   ? Esophageal cancer Neg Hx   ? Prostate cancer Neg Hx   ? Rectal cancer Neg Hx   ? Stomach cancer Neg Hx   ? ? ?Social History  ? ?Socioeconomic History  ? Marital status: Married  ?  Spouse name: Not on file  ? Number of children: Not on file  ? Years of education: Not on file  ? Highest education level: Not on file  ?Occupational History  ? Not on file  ?Tobacco Use  ? Smoking status: Former  ?  Types: Cigarettes  ?  Quit date: 11/2018  ?  Years since quitting: 2.9  ? Smokeless tobacco: Never  ?Vaping Use  ? Vaping Use: Never used  ?Substance and Sexual Activity  ? Alcohol use: Not Currently  ? Drug use: Never  ? Sexual activity: Not on file  ?Other Topics Concern  ? Not on file  ?Social History Narrative  ? Not on file  ? ?Social Determinants of Health  ? ?Financial Resource Strain: Not on file  ?Food Insecurity: Not on file  ?Transportation Needs: Not on file  ?Physical Activity: Not on file  ?Stress: Not on file  ?Social Connections: Not on file  ?Intimate Partner Violence: Not on file  ? ? ?REVIEW OF SYSTEMS: ?Constitutional: Feels awful.  Weak and tired. ?ENT:  No nose bleeds ?Pulm: Dyspnea on exertion has not  accelerated.  He is fairly inactive so does not challenge his respiratory status much.  No cough. ?CV:  No palpitations, no angina.  Weeping fluid from left lower leg as per HPI. ?GU:  No hematuria, no f

## 2021-10-08 ENCOUNTER — Observation Stay (HOSPITAL_COMMUNITY): Payer: BC Managed Care – PPO

## 2021-10-08 DIAGNOSIS — K3189 Other diseases of stomach and duodenum: Secondary | ICD-10-CM | POA: Diagnosis present

## 2021-10-08 DIAGNOSIS — E785 Hyperlipidemia, unspecified: Secondary | ICD-10-CM | POA: Diagnosis present

## 2021-10-08 DIAGNOSIS — E871 Hypo-osmolality and hyponatremia: Secondary | ICD-10-CM | POA: Diagnosis present

## 2021-10-08 DIAGNOSIS — I739 Peripheral vascular disease, unspecified: Secondary | ICD-10-CM | POA: Diagnosis present

## 2021-10-08 DIAGNOSIS — Z8546 Personal history of malignant neoplasm of prostate: Secondary | ICD-10-CM | POA: Diagnosis not present

## 2021-10-08 DIAGNOSIS — I679 Cerebrovascular disease, unspecified: Secondary | ICD-10-CM | POA: Diagnosis present

## 2021-10-08 DIAGNOSIS — Z6835 Body mass index (BMI) 35.0-35.9, adult: Secondary | ICD-10-CM | POA: Diagnosis not present

## 2021-10-08 DIAGNOSIS — M7989 Other specified soft tissue disorders: Secondary | ICD-10-CM | POA: Diagnosis present

## 2021-10-08 DIAGNOSIS — G4733 Obstructive sleep apnea (adult) (pediatric): Secondary | ICD-10-CM | POA: Diagnosis present

## 2021-10-08 DIAGNOSIS — R600 Localized edema: Secondary | ICD-10-CM | POA: Diagnosis present

## 2021-10-08 DIAGNOSIS — I872 Venous insufficiency (chronic) (peripheral): Secondary | ICD-10-CM | POA: Diagnosis present

## 2021-10-08 DIAGNOSIS — E876 Hypokalemia: Secondary | ICD-10-CM | POA: Diagnosis present

## 2021-10-08 DIAGNOSIS — Z9079 Acquired absence of other genital organ(s): Secondary | ICD-10-CM | POA: Diagnosis not present

## 2021-10-08 DIAGNOSIS — K4090 Unilateral inguinal hernia, without obstruction or gangrene, not specified as recurrent: Secondary | ICD-10-CM | POA: Diagnosis present

## 2021-10-08 DIAGNOSIS — R252 Cramp and spasm: Secondary | ICD-10-CM

## 2021-10-08 DIAGNOSIS — K7290 Hepatic failure, unspecified without coma: Secondary | ICD-10-CM | POA: Diagnosis not present

## 2021-10-08 DIAGNOSIS — G4700 Insomnia, unspecified: Secondary | ICD-10-CM | POA: Diagnosis present

## 2021-10-08 DIAGNOSIS — Z9181 History of falling: Secondary | ICD-10-CM | POA: Diagnosis not present

## 2021-10-08 DIAGNOSIS — K766 Portal hypertension: Secondary | ICD-10-CM | POA: Diagnosis present

## 2021-10-08 DIAGNOSIS — E669 Obesity, unspecified: Secondary | ICD-10-CM | POA: Diagnosis present

## 2021-10-08 DIAGNOSIS — K7460 Unspecified cirrhosis of liver: Secondary | ICD-10-CM | POA: Diagnosis not present

## 2021-10-08 DIAGNOSIS — E8770 Fluid overload, unspecified: Secondary | ICD-10-CM | POA: Diagnosis present

## 2021-10-08 DIAGNOSIS — R601 Generalized edema: Secondary | ICD-10-CM | POA: Diagnosis not present

## 2021-10-08 DIAGNOSIS — T502X5A Adverse effect of carbonic-anhydrase inhibitors, benzothiadiazides and other diuretics, initial encounter: Secondary | ICD-10-CM | POA: Diagnosis present

## 2021-10-08 DIAGNOSIS — R188 Other ascites: Secondary | ICD-10-CM | POA: Diagnosis present

## 2021-10-08 DIAGNOSIS — E8809 Other disorders of plasma-protein metabolism, not elsewhere classified: Secondary | ICD-10-CM | POA: Diagnosis present

## 2021-10-08 DIAGNOSIS — K7581 Nonalcoholic steatohepatitis (NASH): Secondary | ICD-10-CM | POA: Diagnosis present

## 2021-10-08 HISTORY — PX: IR PARACENTESIS: IMG2679

## 2021-10-08 LAB — CBC
HCT: 32.3 % — ABNORMAL LOW (ref 39.0–52.0)
Hemoglobin: 11.6 g/dL — ABNORMAL LOW (ref 13.0–17.0)
MCH: 29.7 pg (ref 26.0–34.0)
MCHC: 35.9 g/dL (ref 30.0–36.0)
MCV: 82.8 fL (ref 80.0–100.0)
Platelets: 198 10*3/uL (ref 150–400)
RBC: 3.9 MIL/uL — ABNORMAL LOW (ref 4.22–5.81)
RDW: 18.2 % — ABNORMAL HIGH (ref 11.5–15.5)
WBC: 10.4 10*3/uL (ref 4.0–10.5)
nRBC: 0 % (ref 0.0–0.2)

## 2021-10-08 LAB — COMPREHENSIVE METABOLIC PANEL
ALT: 80 U/L — ABNORMAL HIGH (ref 0–44)
AST: 112 U/L — ABNORMAL HIGH (ref 15–41)
Albumin: 2.4 g/dL — ABNORMAL LOW (ref 3.5–5.0)
Alkaline Phosphatase: 122 U/L (ref 38–126)
Anion gap: 8 (ref 5–15)
BUN: 20 mg/dL (ref 6–20)
CO2: 27 mmol/L (ref 22–32)
Calcium: 8.5 mg/dL — ABNORMAL LOW (ref 8.9–10.3)
Chloride: 95 mmol/L — ABNORMAL LOW (ref 98–111)
Creatinine, Ser: 1.05 mg/dL (ref 0.61–1.24)
GFR, Estimated: >60 mL/min (ref >60)
Glucose, Bld: 117 mg/dL — ABNORMAL HIGH (ref 70–99)
Potassium: 3.5 mmol/L (ref 3.5–5.1)
Sodium: 130 mmol/L — ABNORMAL LOW (ref 135–145)
Total Bilirubin: 4.4 mg/dL — ABNORMAL HIGH (ref 0.3–1.2)
Total Protein: 6.7 g/dL (ref 6.5–8.1)

## 2021-10-08 LAB — HIV ANTIBODY (ROUTINE TESTING W REFLEX): HIV Screen 4th Generation wRfx: NONREACTIVE

## 2021-10-08 LAB — MAGNESIUM: Magnesium: 2 mg/dL (ref 1.7–2.4)

## 2021-10-08 LAB — PHOSPHORUS: Phosphorus: 3.1 mg/dL (ref 2.5–4.6)

## 2021-10-08 LAB — BODY FLUID CELL COUNT WITH DIFFERENTIAL
Eos, Fluid: 0 %
Lymphs, Fluid: 68 %
Monocyte-Macrophage-Serous Fluid: 23 % — ABNORMAL LOW (ref 50–90)
Neutrophil Count, Fluid: 9 % (ref 0–25)
Total Nucleated Cell Count, Fluid: 129 cu mm (ref 0–1000)

## 2021-10-08 LAB — GRAM STAIN

## 2021-10-08 IMAGING — US IR PARACENTESIS
1 series · 1 of 1 positions shown · non-contrast
Comparison: none

INDICATION: Patient history of NASH cirrhosis, hemochromatosis, recurrent
ascites. Request IR for diagnostic and therapeutic paracentesis.

[Series 1: ir (person_name)/(person_name) · 1 of 1 slices shown]
[im 1/1]
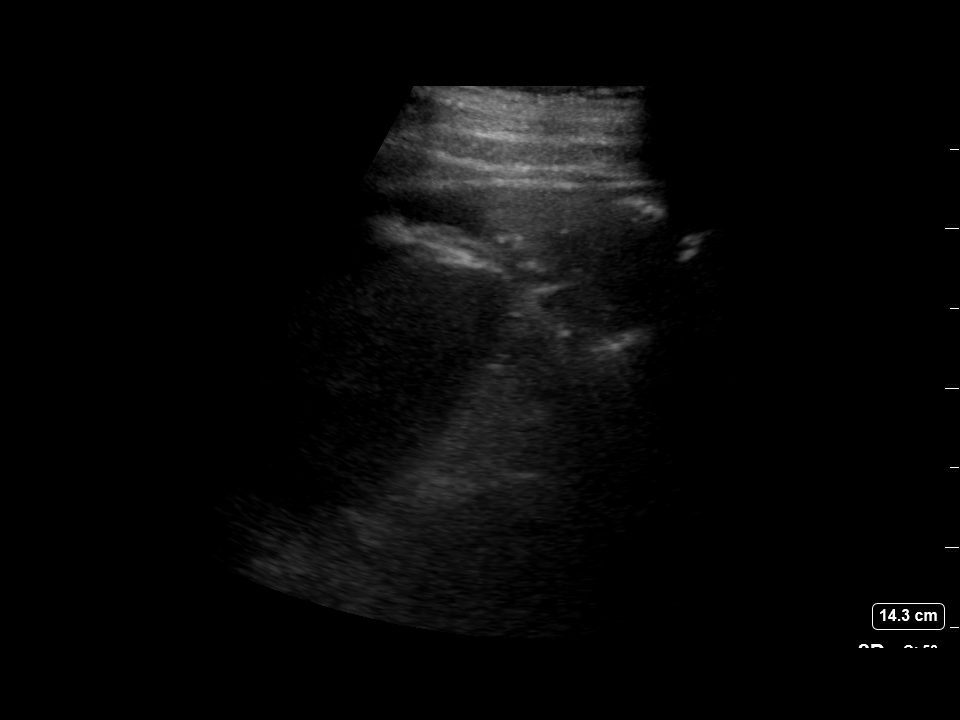

[1 of 1 positions shown; findings below may reference images not displayed]

EXAM:
ULTRASOUND GUIDED DIAGNOSTIC AND THERAPEUTIC PARACENTESIS

MEDICATIONS:
10 mL 1% lidocaine

COMPLICATIONS:
None immediate.

PROCEDURE:
Informed written consent was obtained from the patient after a
discussion of the risks, benefits and alternatives to treatment. A
timeout was performed prior to the initiation of the procedure.

Initial ultrasound scanning demonstrates a moderate amount of
ascites within the right upper abdominal quadrant. The right upper
abdomen was prepped and draped in the usual sterile fashion. 1%
lidocaine was used for local anesthesia.

Following this, a 19 gauge, 7-cm, Yueh catheter was introduced. An
ultrasound image was saved for documentation purposes. The
paracentesis was performed. The catheter was removed and a dressing
was applied. The patient tolerated the procedure well without
immediate post procedural complication.
FINDINGS: A total of approximately 2.4 L of cloudy yellow fluid was removed.
Samples were sent to the laboratory as requested by the clinical
team.
IMPRESSION: Successful ultrasound-guided paracentesis yielding 2.4 liters of
peritoneal fluid.

Read by NICHE

## 2021-10-08 MED ORDER — ALBUMIN HUMAN 25 % IV SOLN
25.0000 g | Freq: Four times a day (QID) | INTRAVENOUS | Status: AC
  Administered 2021-10-08 (×2): 25 g via INTRAVENOUS
  Filled 2021-10-08 (×3): qty 100

## 2021-10-08 MED ORDER — LIDOCAINE HCL (PF) 1 % IJ SOLN
INTRAMUSCULAR | Status: DC | PRN
  Administered 2021-10-08: 5 mL

## 2021-10-08 MED ORDER — ZINC SULFATE 220 (50 ZN) MG PO CAPS
220.0000 mg | ORAL_CAPSULE | Freq: Every day | ORAL | Status: DC
  Administered 2021-10-08 – 2021-10-11 (×4): 220 mg via ORAL
  Filled 2021-10-08 (×4): qty 1

## 2021-10-08 MED ORDER — ALBUMIN HUMAN 25 % IV SOLN
25.0000 g | Freq: Four times a day (QID) | INTRAVENOUS | Status: DC
  Administered 2021-10-08: 25 g via INTRAVENOUS
  Filled 2021-10-08 (×2): qty 100

## 2021-10-08 MED ORDER — POTASSIUM CHLORIDE CRYS ER 20 MEQ PO TBCR
40.0000 meq | EXTENDED_RELEASE_TABLET | ORAL | Status: AC
  Administered 2021-10-08 (×2): 40 meq via ORAL
  Filled 2021-10-08 (×2): qty 2

## 2021-10-08 MED ORDER — FUROSEMIDE 10 MG/ML IJ SOLN
80.0000 mg | Freq: Two times a day (BID) | INTRAMUSCULAR | Status: DC
  Administered 2021-10-08 – 2021-10-11 (×8): 80 mg via INTRAVENOUS
  Filled 2021-10-08 (×8): qty 8

## 2021-10-08 MED ORDER — GABAPENTIN 100 MG PO CAPS
100.0000 mg | ORAL_CAPSULE | Freq: Every day | ORAL | Status: DC
  Administered 2021-10-08 – 2021-10-10 (×3): 100 mg via ORAL
  Filled 2021-10-08 (×3): qty 1

## 2021-10-08 MED ORDER — TRAZODONE HCL 50 MG PO TABS
50.0000 mg | ORAL_TABLET | Freq: Every evening | ORAL | Status: DC | PRN
  Administered 2021-10-08 – 2021-10-09 (×2): 50 mg via ORAL
  Filled 2021-10-08 (×2): qty 1

## 2021-10-08 MED ORDER — LIDOCAINE HCL 1 % IJ SOLN
INTRAMUSCULAR | Status: AC
  Filled 2021-10-08: qty 20

## 2021-10-08 MED ORDER — TRAZODONE HCL 50 MG PO TABS: 50.0000 mg | ORAL_TABLET | Freq: Every day | ORAL | Status: DC

## 2021-10-08 NOTE — Hospital Course (Addendum)
Darryl Velazquez is Darryl Velazquez 61 y.o. male with medical history significant of   ?cirrhosis secondary to NASH, chronic hyponatremia, hypoalbuminemia, hx of CVA, venous insufficiency, HLD, OSA, hx of prostate surgery  who presented to ED with complaints of bilateral leg swelling and weeping.  He's been admitted with worsening volume overload. ? ?He's improved after paracentesis and IV lasix with albumin.  His spironolactone was increased.  He was discharged on 5/6 with increased oral diuretic regimen.  It was recommended he have right lower extremity US prior to discharge, but he refused this and said he would leave AMA if he weren't discharged.  Education provided regarding what we were looking for and he notes understanding.  Recommended follow up with outpatient providers and pursuing outpatient [place] if unilateral leg swelling doesn't improve. ? ?See below for additional details ?

## 2021-10-08 NOTE — Procedures (Signed)
PROCEDURE SUMMARY: ? ?Successful ultrasound guided paracentesis from the right upper quadrant.  ?Yielded 2.4 L of cloudy yellow fluid.  ?No immediate complications.  ?The patient tolerated the procedure well.  ? ?Specimen was sent for labs. ? ?EBL < 42m ? ?The patient has required >/=2 paracenteses in a 30 day period and a screening evaluation by the [facility] has been arranged. ? ?SCandiss Norse PA-C ? ? ?

## 2021-10-08 NOTE — Assessment & Plan Note (Addendum)
Improved ?Increase doses of lasix/spironolactone at discharge as above ?

## 2021-10-08 NOTE — Progress Notes (Signed)
? ?  Portal Hypertension Clinic Screening Evaluation ? ? ?Indication for evaluation: ?Darryl Velazquez is a 61 y.o. male undergoing preliminary evaluation in the [facility] Inter[facility] due to recurrent ascites. ? ?Referring Physician/Established Gastroenterologist: ? ?Etiology of cirrhosis: NASH ?Initially diagnosed: 2021 ?# of paracentesis in last month: 3 ?# of paracentesis in last 2 months: 3 ?History of hepatic hydrothorax:  no ?History of hepatic encephalopathy:yes ? ?Prior evaluation for liver transplant: yes (patient of Dawn Drazek, no pending listing) ?History of hepatocellular carcinoma: no ? ?Prior esophagogastroduodenoscopy/intervention: 05/20/20 (Dr. Bryan Lemma) ?Current esophageal varices: no ?Current gastric varices: no ?History of hematemesis: no ? ?Current diuretic regimen: furosemide 80 mg BID, spironolactone 100 mg QD ?Current pharmacologic encephalopathy prophylaxis/treatment: rifaximin 550 mg BID ? ?History of renal dysfunction: no ?History of hemodialysis: no ? ?History of cardiac dysfunction: no ? ?Other pertinent past medical history: History of stroke, hereditary hemochromatosis, peripheral artery disease, hyperlipidemia ? ? ?Imaging: ?Prior cross sectional imaging of portal system: ?09/13/21 ? ?Patent portal system.  Diminutive intrahepatic portal veins.  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   ? ?Echocardiogram: None ? ? ?Labs: ?10/08/21 ?Creatinine: 1.05 ?Total Bilirubin: 4.4 ?INR: 1.4 ?Sodium: 130 ?Albumin: 2.4 ?Ammonia: 36 ?AST/ALT: 112/80 ? ?Child-Pugh = 11 points, class C ?MELD = 16 (6% estimated 3 month mortality) ?[place] Index of Post-TIPS Survival (FIPS) = 1.25 (Overall survival estimated at 1 month 85.1%, 3 months 60.4%, and 6 months 47.9%) ? ? ? ?Assessment: ?Darryl Velazquez is a 60 y.o. male with history of NASH cirrhosis (Child Pugh C, MELD 16) with recurrent ascites.  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  The largest diameter varix is 1.8 cm.  My suspicion is prior hepatic encephalopathy/persistently elevated ammonia levels are due to this shunting.  It is also possible that this ectopic varix could be etiology of recent abdominal pain.  After preliminary evaluation, this patient would likely benefit from variceal embolization/obliteration and possibly concomitant TIPS creation.  Given diminutive intrahepatic portal veins, TIPS creation/approach to embolization would be difficult.  Retrograde approach to the varix via the gonadal vein is feasible, however central control/access near the SMV side would be technically very challenging/impossible.  Trans-splenic access remains available and likely would be needed in this case.  If variceal embolization alone is pursued, portal hypertension/ascites could worsen.  Varix obliteration + TIPS would allow for improved antegrade portal flow with overall likely reduction in volume of portosystemic shunting. ? ?Recommendation: ?Formal consult for portal venous intervention could be considered, either inpatient or outpatient, pending clinical course/fluid status.  The patient's Gastroenterologists, Drs. Armbruster and Cirigliano, will be contacted for further discussion. ? ? ? ?Electronically Signed: ?Suzette Battiest, MD ?10/08/2021, 2:36 PM ? ? ? ?

## 2021-10-08 NOTE — Progress Notes (Addendum)
?PROGRESS NOTE ? ? ? ?Darryl Velazquez  UMP:[id number] DOB: Dec 02, 1960 DOA: 10/07/2021 ?PCP: [name], MD  ?Chief Complaint  ?Patient presents with  ? Leg Swelling  ? Nausea  ? ? ?Brief Narrative:  ? Darryl Velazquez is Darryl Velazquez 61 y.o. male with medical history significant of   ?cirrhosis secondary to NASH, chronic hyponatremia, hypoalbuminemia, hx of CVA, venous insufficiency, HLD, OSA, hx of prostate surgery  who presented to ED with complaints of bilateral leg swelling and weeping.  He's been admitted with worsening volume overload. ? ?See below for additional details  ? ? ?Assessment & Plan: ?  ?Principal Problem: ?  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  Hyponatremia ?  Anasarca ?  Hypokalemia ?  Hemochromatosis, hereditary ([place]) ?  Cramping of upper and lower extremities ?  Chronic venous insufficiency ?  Right inguinal hernia ?  Sleep apnea ? ? 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  ?-GI consulted, appreciate assistance ?-continue IV lasix BID, continue spironolactone - will need uptitration of outpatient oral dosing (taking lasix 80 qam and 40 qpm as well as spironolactone 1000 mg daily) ?-albumin as well with hypoalbuminemia ?- continue rifaximin, lactulose not on med list ?- decision regarding para deferred to GI ?- low sodium diet  ?- EGD (10/2020, inpatient at [facility]): No esophageal varices, portal hypertensive gastropathy ? ?Anasarca ?Diuresis with lasix + albumin given hypoalbuminemia  ?spironolactone ? ?Hyponatremia ?Chronic in setting of volume overload with cirrhosis  ?Stable ?Continue with diuresis/paracentesis ?Trend   ? ?Hypokalemia ?Replace and follow ?Normal mag ? ?Cramping of upper and lower extremities ?Suspect this is related to his cirrhosis ?Will replace lytes ?Will discuss with  GI ? ? ?Hemochromatosis, hereditary ([place]) ?Follows in the Hematology clinic for hemochromatosis with heterozygous H63D mutation ?Weekly phlebotomy  ? ?Chronic venous insufficiency ?04/2020: Evaluated by Dr. [name] in Cardiology clinic for new onset LE edema.  TTE unremarkable.  Started on diuretics. ?Leg elevation/TED hose ?Will have wound care also see ?Treat underlying decompensated cirrhosis  ? ? ?Right inguinal hernia ?Was seen by Dr. [name] in the General Surgery clinic on 09/05/2021 for evaluation of right inguinal hernia.  ?-decided poor candidate for hernia repair. ?  ? ?Sleep apnea ?Not using cpap machine  ? ?OSA on CPAP-resolved as of 10/07/2021 ?Not on cpap  ? ? ?DVT prophylaxis: lovenox ?Code Status: full ?Family Communication: none ?Disposition:  ? ?Status is: Observation ?The patient remains OBS appropriate and will d/c before 2 midnights. ?  ?Consultants:  ?GI ? ?Procedures:  ?none ? ?Antimicrobials:  ?Anti-infectives (From admission, onward)  ? ? Start     Dose/Rate Route Frequency Ordered Stop  ? 10/07/21 2200  rifaximin (XIFAXAN) tablet 550 mg       ? 550 mg Oral 2 times daily 10/07/21 1926    ? ?  ? ? ?Subjective: ?Has difficulty sleeping here ?Cramping and size of bed ? ?Objective: ?Vitals:  ? 10/08/21 1015 10/08/21 1100 10/08/21 1200 10/08/21 1228  ?BP: (!) 146/75 (!) 151/74 (!) 152/77   ?Pulse: 86 84 92   ?Resp: 13 17 18    ?Temp:    98.2 ?F (36.8 ?C)  ?TempSrc:    Oral  ?SpO2: 100% 95% 94%   ?Weight:      ?Height:      ? ? ?Intake/Output Summary (Last 24 hours)  at 10/08/2021 1253 ?Last data filed at 10/08/2021 1235 ?Gross per 24 hour  ?Intake 200 ml  ?Output 2200 ml  ?Net -2000 ml  ? ?Filed Weights  ? 10/07/21 1249  ?Weight: 131.1 kg  ? ? ?Examination: ? ?General exam: Appears calm and comfortable  ?Respiratory system: unlabored ?Cardiovascular system: RRR ?Gastrointestinal system: Abdomen is distended ?Central nervous system: Alert and oriented. No focal neurological deficits. ?Extremities: bilateral  LE edema ? ? ? ?Data Reviewed: I have personally reviewed following labs and imaging studies ? ?CBC: ?Recent Labs  ?Lab 10/07/21 ?1336 10/08/21 ?7673  ?WBC 9.8 10.4  ?NEUTROABS 7.0  --   ?HGB 11.8* 11.6*  ?HCT 32.9* 32.3*  ?MCV 83.9 82.8  ?PLT 213 198  ? ? ?Basic Metabolic Panel: ?Recent Labs  ?Lab 10/07/21 ?1336 10/07/21 ?1725 10/08/21 ?4193  ?NA 130*  --  130*  ?K 3.3*  --  3.5  ?CL 93*  --  95*  ?CO2 28  --  27  ?GLUCOSE 224*  --  117*  ?BUN 22*  --  20  ?CREATININE 1.17  --  1.05  ?CALCIUM 8.5*  --  8.5*  ?MG  --  2.0  --   ? ? ?GFR: ?Estimated Creatinine Clearance: 110.6 mL/min (by C-G formula based on SCr of 1.05 mg/dL). ? ?Liver Function Tests: ?Recent Labs  ?Lab 10/07/21 ?1336 10/08/21 ?7902  ?AST 122* 112*  ?ALT 83* 80*  ?ALKPHOS 146* 122  ?BILITOT 3.6* 4.4*  ?PROT 6.8 6.7  ?ALBUMIN 2.6* 2.4*  ? ? ?CBG: ?No results for input(s): GLUCAP in the last 168 hours. ? ? ?No results found for this or any previous visit (from the past 240 hour(s)).  ? ? ? ? ? ?Radiology Studies: ?DG Chest 2 View ? ?Result Date: 10/07/2021 ?CLINICAL DATA:  Fluid overload. Shortness of breath. Supposed to get paracentesis today EXAM: CHEST - 2 VIEW COMPARISON:  AP chest 09/06/2021, 06/01/2021, and 08/22/2020 FINDINGS: Cardiac silhouette and mediastinal contours are within normal limits. Mildly decreased lung volumes. Mild chronic bilateral interstitial thickening is similar to multiple prior radiographs. Mild bibasilar bronchovascular crowding. No definite focal airspace opacity to indicate pneumonia. No pleural effusion or pneumothorax. Mild-to-moderate multilevel degenerative disc changes of the thoracic spine. IMPRESSION: Mild bilateral interstitial thickening is similar to multiple prior radiographs, either mild interstitial pulmonary edema or chronic interstitial lung disease. Electronically Signed   By: Yvonne Kendall M.D.   On: 10/07/2021 14:02   ? ? ? ? ? ?Scheduled Meds: ? famotidine  20 mg Oral BID  ? furosemide  80 mg  Intravenous BID  ? potassium chloride  40 mEq Oral Q4H  ? rifaximin  550 mg Oral BID  ? rosuvastatin  5 mg Oral QODAY  ? sodium chloride flush  3 mL Intravenous Q12H  ? spironolactone  100 mg Oral Daily  ? traZODone  50 mg Oral QHS  ? ?Continuous Infusions: ? sodium chloride    ? albumin human    ? ? ? LOS: 0 days  ? ? ?Time spent: over 30 min ? ? ? ?Darryl Helper, MD ?[facility] ? ? ?To contact the attending provider between 7A-7P or the covering provider during after hours 7P-7A, please log into the web site [email or web address] and access using universal [place] password for that web site. If you do not have the password, please call the hospital operator. ? ?10/08/2021, 12:53 PM  ? ? ?

## 2021-10-08 NOTE — Assessment & Plan Note (Addendum)
Suspect this is related to his cirrhosis ?Seems improved with gabapentin ?Can try pickle juice as well outpatient ?

## 2021-10-08 NOTE — ED Notes (Signed)
Taken to IR by transporter for paracentesis.  ?

## 2021-10-08 NOTE — ED Notes (Signed)
Breakfast order placed ?

## 2021-10-08 NOTE — Consult Note (Signed)
WOC Nurse Consult Note: ?Reason for Consult: pint point opening on LLE at pretibial area weeping serous fluid ?Wound type: venous insufficiency, lymphedema ?Pressure Injury POA: N/A ?Measurement: ?Wound bed: ?Drainage (amount, consistency, odor)  ?Periwound: ?Dressing procedure/placement/frequency: ?Complex medical patient with NASH and bilateral LE edema. He reports this is better since initiation of Lasix therapy years ago, but has continued. He has tired bilateral compression hosiery without success and has lymphedema pumps at home that he no longer uses due to the edema relocating to his perineal area and abdomen.WOC is consulted to manage small area with weeping in the left pretibial area, so surrounding erythema, induration of fluctuance.Patient's main concern is infection as he has been reading about cellulitis on line. I reassure him that at this time there is no indication of infection. ?I will provide guidance for Nursing top the topical care of this lesion which will include covering the area with a silver hydrofiber and topping with dry gauze, securing with a silicone foam. The LE is to be wrapped from toe to knee, heel inclusive with a Kerlix roll gauze bandage and this topped with a 6-inch ACE bandage wrapped in a similar manner. LE elevation will continue. ? ?Patient to return to the care and oversight of his POC with his PCP and specialist team at [facility] and elsewhere upon discharge as he continues to be optimistic for a liver transplant.  ? ?[place] nursing team will not follow, but will remain available to this patient, the nursing and medical teams.  Please re-consult if needed. ?Thanks, ?Maudie Flakes, MSN, RN, [place], [place], [facility]ON-AP, [place]  ?Pager# [phone]  ? ? ?  ?

## 2021-10-09 ENCOUNTER — Encounter (HOSPITAL_COMMUNITY): Payer: Self-pay | Admitting: Family Medicine

## 2021-10-09 LAB — CBC WITH DIFFERENTIAL/PLATELET
Abs Immature Granulocytes: 0.03 10*3/uL (ref 0.00–0.07)
Basophils Absolute: 0 10*3/uL (ref 0.0–0.1)
Basophils Relative: 1 %
Eosinophils Absolute: 0.1 10*3/uL (ref 0.0–0.5)
Eosinophils Relative: 1 %
HCT: 26.9 % — ABNORMAL LOW (ref 39.0–52.0)
Hemoglobin: 9.4 g/dL — ABNORMAL LOW (ref 13.0–17.0)
Immature Granulocytes: 0 %
Lymphocytes Relative: 15 %
Lymphs Abs: 1.1 10*3/uL (ref 0.7–4.0)
MCH: 29.6 pg (ref 26.0–34.0)
MCHC: 34.9 g/dL (ref 30.0–36.0)
MCV: 84.6 fL (ref 80.0–100.0)
Monocytes Absolute: 1.7 10*3/uL — ABNORMAL HIGH (ref 0.1–1.0)
Monocytes Relative: 23 %
Neutro Abs: 4.6 10*3/uL (ref 1.7–7.7)
Neutrophils Relative %: 60 %
Platelets: 160 10*3/uL (ref 150–400)
RBC: 3.18 MIL/uL — ABNORMAL LOW (ref 4.22–5.81)
RDW: 18.1 % — ABNORMAL HIGH (ref 11.5–15.5)
WBC: 7.6 10*3/uL (ref 4.0–10.5)
nRBC: 0 % (ref 0.0–0.2)

## 2021-10-09 LAB — COMPREHENSIVE METABOLIC PANEL
ALT: 57 U/L — ABNORMAL HIGH (ref 0–44)
AST: 87 U/L — ABNORMAL HIGH (ref 15–41)
Albumin: 2.5 g/dL — ABNORMAL LOW (ref 3.5–5.0)
Alkaline Phosphatase: 102 U/L (ref 38–126)
Anion gap: 7 (ref 5–15)
BUN: 19 mg/dL (ref 6–20)
CO2: 27 mmol/L (ref 22–32)
Calcium: 8.6 mg/dL — ABNORMAL LOW (ref 8.9–10.3)
Chloride: 97 mmol/L — ABNORMAL LOW (ref 98–111)
Creatinine, Ser: 1 mg/dL (ref 0.61–1.24)
GFR, Estimated: >60 mL/min (ref >60)
Glucose, Bld: 128 mg/dL — ABNORMAL HIGH (ref 70–99)
Potassium: 3.9 mmol/L (ref 3.5–5.1)
Sodium: 131 mmol/L — ABNORMAL LOW (ref 135–145)
Total Bilirubin: 2.9 mg/dL — ABNORMAL HIGH (ref 0.3–1.2)
Total Protein: 5.7 g/dL — ABNORMAL LOW (ref 6.5–8.1)

## 2021-10-09 LAB — PATHOLOGIST SMEAR REVIEW

## 2021-10-09 LAB — HEMOGLOBIN AND HEMATOCRIT, BLOOD
HCT: 30.2 % — ABNORMAL LOW (ref 39.0–52.0)
Hemoglobin: 10.4 g/dL — ABNORMAL LOW (ref 13.0–17.0)

## 2021-10-09 LAB — PHOSPHORUS: Phosphorus: 2.6 mg/dL (ref 2.5–4.6)

## 2021-10-09 LAB — MAGNESIUM: Magnesium: 1.9 mg/dL (ref 1.7–2.4)

## 2021-10-09 MED ORDER — ALBUMIN HUMAN 25 % IV SOLN
12.5000 g | Freq: Once | INTRAVENOUS | Status: AC
  Administered 2021-10-09: 12.5 g via INTRAVENOUS
  Filled 2021-10-09: qty 50

## 2021-10-09 MED ORDER — SPIRONOLACTONE 25 MG PO TABS
50.0000 mg | ORAL_TABLET | Freq: Once | ORAL | Status: AC
  Administered 2021-10-09: 50 mg via ORAL
  Filled 2021-10-09: qty 2

## 2021-10-09 MED ORDER — SPIRONOLACTONE 25 MG PO TABS
200.0000 mg | ORAL_TABLET | Freq: Every day | ORAL | Status: DC
  Administered 2021-10-10 – 2021-10-11 (×2): 200 mg via ORAL
  Filled 2021-10-09 (×2): qty 8

## 2021-10-09 MED ORDER — SPIRONOLACTONE 25 MG PO TABS: 150.0000 mg | ORAL_TABLET | Freq: Every day | ORAL | Status: DC

## 2021-10-09 MED ORDER — ALBUMIN HUMAN 25 % IV SOLN
25.0000 g | Freq: Four times a day (QID) | INTRAVENOUS | Status: AC
  Administered 2021-10-09 (×2): 25 g via INTRAVENOUS
  Filled 2021-10-09 (×2): qty 100

## 2021-10-09 MED ORDER — ALBUMIN HUMAN 25 % IV SOLN: 12.5000 g | Freq: Four times a day (QID) | INTRAVENOUS | Status: DC

## 2021-10-09 NOTE — Progress Notes (Signed)
?PROGRESS NOTE ? ? ? ?NAS WAFER  OFB:[id number] DOB: 1960-11-18 DOA: 10/07/2021 ?PCP: [name], MD  ?Chief Complaint  ?Patient presents with  ? Leg Swelling  ? Nausea  ? ? ?Brief Narrative:  ? Darryl Velazquez is Darryl Velazquez 61 y.o. male with medical history significant of   ?cirrhosis secondary to NASH, chronic hyponatremia, hypoalbuminemia, hx of CVA, venous insufficiency, HLD, OSA, hx of prostate surgery  who presented to ED with complaints of bilateral leg swelling and weeping.  He's been admitted with worsening volume overload. ? ?See below for additional details  ? ? ?Assessment & Plan: ?  ?Principal Problem: ?  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  Hyponatremia ?  Anasarca ?  Hypokalemia ?  Hemochromatosis, hereditary ([place]) ?  Cramping of upper and lower extremities ?  Chronic venous insufficiency ?  Right inguinal hernia ?  Sleep apnea ? ? 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  ?-GI consulted, appreciate assistance ? [name] recommending hepatology evaluation outpatient for possible transplant, IR eval for portal hypertension ?-continue IV lasix BID, continue spironolactone - will need uptitration of outpatient oral dosing (taking lasix 80 qam and 40 qpm at home as well as spironolactone 100 mg daily) ?- increase spironolactone to 200 mg daily ?-albumin as well with hypoalbuminemia ?- continue rifaximin, lactulose not on med list ?- s/p para with 2.4 L removed, not c/w SBP - cultures pending ?- low sodium diet  ?- EGD (10/2020, inpatient at [facility]): No esophageal varices, portal hypertensive gastropathy ? ?Anasarca ?Diuresis with lasix + albumin given hypoalbuminemia  ?spironolactone ? ?Hyponatremia ?Chronic in setting of volume overload with cirrhosis  ?Stable ?Continue with diuresis/paracentesis ?Trend    ? ?Hypokalemia ?Replace and follow ?Normal mag ? ?Cramping of upper and lower extremities ?Suspect this is related to his cirrhosis ?Seems improved with gabapentin ? ? ?Hemochromatosis, hereditary ([place]) ?Follows in the Hematology clinic for hemochromatosis with heterozygous H63D mutation ?Weekly phlebotomy  ? ?Chronic venous insufficiency ?04/2020: Evaluated by Dr. [name] in Cardiology clinic for new onset LE edema.  TTE unremarkable.  Started on diuretics. ?Leg elevation/TED hose ?Will have wound care also see ?Treat underlying decompensated cirrhosis  ? ? ?Right inguinal hernia ?Was seen by Dr. [name] in the General Surgery clinic on 09/05/2021 for evaluation of right inguinal hernia.  ?-decided poor candidate for hernia repair. ?  ? ?Sleep apnea ?Not using cpap machine  ? ?OSA on CPAP-resolved as of 10/07/2021 ?Not on cpap  ? ? ?DVT prophylaxis: lovenox ?Code Status: full ?Family Communication: none ?Disposition:  ? ?Status is: inpatient ?The patient will require care spanning > 2 midnights and should be moved to inpatient because: additional diuresis ?  ?Consultants:  ?GI ? ?Procedures:  ?none ? ?Antimicrobials:  ?Anti-infectives (From admission, onward)  ? ? Start     Dose/Rate Route Frequency Ordered Stop  ? 10/07/21 2200  rifaximin (XIFAXAN) tablet 550 mg       ? 550 mg Oral 2 times daily 10/07/21 1926    ? ?  ? ? ?Subjective: ?Slept better last night ? ?Objective: ?Vitals:  ? 10/09/21 0238 10/09/21 7824 10/09/21 0856 10/09/21 1656  ?BP: (!) 142/68 134/87 139/72 138/75  ?Pulse: 89 87 76 72  ?Resp: 19 18 19 17   ?Temp: 98.4 ?F (36.9 ?C) 98.3 ?F (36.8 ?C) 98.4 ?F (36.9 ?C)  98.3 ?F (36.8 ?C)  ?TempSrc: Oral Oral Oral Oral  ?SpO2: 97% 98% 98% 100%  ?Weight:      ?Height:      ? ? ?Intake/Output Summary (Last 24 hours) at 10/09/2021 2006 ?Last data filed at 10/09/2021 1843 ?Gross per 24 hour  ?Intake 1259.98 ml  ?Output 4200 ml  ?Net -2940.02 ml  ? ?Filed Weights  ? 10/07/21 1249 10/08/21 2216  ?Weight: 131.1 kg 124.6 kg   ? ? ?Examination: ? ?General: No acute distress. ?Cardiovascular: RRR ?Lungs: unlabored ?Abdomen: Soft, nontender, distended  ?Neurological: Alert and oriented ?3. Moves all extremities ?4. Cranial nerves II through XII grossly intact. ?Extremities: anasarca, bilateral LE edema ? ?Data Reviewed: I have personally reviewed following labs and imaging studies ? ?CBC: ?Recent Labs  ?Lab 10/07/21 ?1336 10/08/21 ?8242 10/09/21 ?0358 10/09/21 ?1359  ?WBC 9.8 10.4 7.6  --   ?NEUTROABS 7.0  --  4.6  --   ?HGB 11.8* 11.6* 9.4* 10.4*  ?HCT 32.9* 32.3* 26.9* 30.2*  ?MCV 83.9 82.8 84.6  --   ?PLT 213 198 160  --   ? ? ?Basic Metabolic Panel: ?Recent Labs  ?Lab 10/07/21 ?1336 10/07/21 ?1725 10/08/21 ?3536 10/09/21 ?0358  ?NA 130*  --  130* 131*  ?K 3.3*  --  3.5 3.9  ?CL 93*  --  95* 97*  ?CO2 28  --  27 27  ?GLUCOSE 224*  --  117* 128*  ?BUN 22*  --  20 19  ?CREATININE 1.17  --  1.05 1.00  ?CALCIUM 8.5*  --  8.5* 8.6*  ?MG  --  2.0 2.0 1.9  ?PHOS  --   --  3.1 2.6  ? ? ?GFR: ?Estimated Creatinine Clearance: 113.2 mL/min (by C-G formula based on SCr of 1 mg/dL). ? ?Liver Function Tests: ?Recent Labs  ?Lab 10/07/21 ?1336 10/08/21 ?1443 10/09/21 ?0358  ?AST 122* 112* 87*  ?ALT 83* 80* 57*  ?ALKPHOS 146* 122 102  ?BILITOT 3.6* 4.4* 2.9*  ?PROT 6.8 6.7 5.7*  ?ALBUMIN 2.6* 2.4* 2.5*  ? ? ?CBG: ?No results for input(s): GLUCAP in the last 168 hours. ? ? ?Recent Results (from the past 240 hour(s))  ?Gram stain     Status: None  ? Collection Time: 10/08/21  1:42 PM  ? Specimen: Abdomen; Peritoneal Fluid  ?Result Value Ref Range Status  ? Specimen Description PERITONEAL FLUID  Final  ? Special Requests ABDOMEN  Final  ? Gram Stain   Final  ?  WBC PRESENT,BOTH PMN AND MONONUCLEAR ?NO ORGANISMS SEEN ?CYTOSPIN SMEAR ?Performed at [facility], [place] [address], [place], [place] [address] ?  ? Report Status 10/08/2021 FINAL  Final  ?Culture, body fluid w Gram Stain-bottle     Status: None (Preliminary result)  ? Collection Time: 10/08/21   1:42 PM  ? Specimen: Peritoneal Washings  ?Result Value Ref Range Status  ? Specimen Description PERITONEAL FLUID  Final  ? Special Requests ABDOMEN  Final  ? Culture   Final  ?  NO GROWTH < 24 HOURS ?Performed at [facility], [place] [address] [place], [place] [address] ?  ? Report Status PENDING  Incomplete  ?  ? ? ? ? ? ?Radiology Studies: ?IR Paracentesis ? ?Result Date: 10/08/2021 ?INDICATION: Patient history of NASH cirrhosis, hemochromatosis, recurrent ascites. Request IR for diagnostic and therapeutic paracentesis. EXAM: ULTRASOUND GUIDED DIAGNOSTIC AND THERAPEUTIC PARACENTESIS MEDICATIONS: 10 mL 1% lidocaine COMPLICATIONS: None immediate. PROCEDURE: Informed written consent was obtained from the  patient after Shakora Nordquist discussion of the risks, benefits and alternatives to treatment. Risa Auman timeout was performed prior to the initiation of the procedure. Initial ultrasound scanning demonstrates Jeannelle Wiens moderate amount of ascites within the right upper abdominal quadrant. The right upper abdomen was prepped and draped in the usual sterile fashion. 1% lidocaine was used for local anesthesia. Following this, Rashod Gougeon 19 gauge, 7-cm, Yueh catheter was introduced. An ultrasound image was saved for documentation purposes. The paracentesis was performed. The catheter was removed and Nijah Tejera dressing was applied. The patient tolerated the procedure well without immediate post procedural complication. FINDINGS: Cannon Quinton total of approximately 2.4 L of cloudy yellow fluid was removed. Samples were sent to the laboratory as requested by the clinical team. IMPRESSION: Successful ultrasound-guided paracentesis yielding 2.4 liters of peritoneal fluid. Read by Candiss Norse, PA-C Electronically Signed   By: Lucrezia Europe M.D.   On: 10/08/2021 14:10   ? ? ? ? ? ?Scheduled Meds: ? famotidine  20 mg Oral BID  ? furosemide  80 mg Intravenous BID  ? gabapentin  100 mg Oral QHS  ? rifaximin  550 mg Oral BID  ? rosuvastatin  5 mg Oral QODAY  ? sodium chloride  flush  3 mL Intravenous Q12H  ? [START ON 10/10/2021] spironolactone  200 mg Oral Daily  ? zinc sulfate  220 mg Oral Daily  ? ?Continuous Infusions: ? sodium chloride    ? ? ? LOS: 1 day  ? ? ?Time spent: over 30 m

## 2021-10-09 NOTE — Progress Notes (Signed)
? ? Progress Note ? ? Subjective  ?Chief Complaint: Darryl Velazquez ? ?Today, the patient tells me that he is feeling some better.  He discusses that he had trouble actually getting into a room yesterday evening which I just did not happen till around 9 PM.  Tells me that he really came in because his left leg started "oozing" fluid.  Explains that he would prefer not to go home until things are situated with his diuretics and he knows what to do about his legs.  Does tell me that he has not been sleeping but they gave him some really good medicine yesterday that relaxed him and he slept for about 3 hours and tells me he must of been really "sedated", because he urinated in his sleep. ? ? Objective  ? ?Vital signs in last 24 hours: ?Temp:  [98 ?F (36.7 ?C)-98.6 ?F (37 ?C)] 98.4 ?F (36.9 ?C) (05/04 2458) ?Pulse Rate:  [76-97] 76 (05/04 0856) ?Resp:  [15-19] 19 (05/04 0856) ?BP: (134-154)/(67-116) 139/72 (05/04 0856) ?SpO2:  [96 %-100 %] 98 % (05/04 0856) ?Weight:  [124.6 kg] 124.6 kg (05/03 2216) ?Last BM Date : 10/08/21 ?General:   male in NAD ?Heart:  Regular rate and rhythm; no murmurs ?Lungs: Respirations even and unlabored, lungs CTA bilaterally ?Abdomen:  Soft, nontender and nondistended. Normal bowel sounds. ?Extremities: Bandage left lower leg, pitting edema in the right leg to the level of the knee ?Neurologic:  Alert and oriented,  grossly normal neurologically. ?Psych:  Cooperative. Normal mood and affect. ? ?Intake/Output from previous day: ?05/03 0701 - 05/04 0700 ?In: 800 [P.O.:600; IV Piggyback:200] ?Out: 5850 [Urine:5850] ?Intake/Output this shift: ?Total I/O ?In: 220 [P.O.:220] ?Out: 800 [Urine:800] ? ?Lab Results: ?Recent Labs  ?  10/07/21 ?1336 10/08/21 ?0998 10/09/21 ?0358  ?WBC 9.8 10.4 7.6  ?HGB 11.8* 11.6* 9.4*  ?HCT 32.9* 32.3* 26.9*  ?PLT 213 198 160  ? ?BMET ?Recent Labs  ?  10/07/21 ?1336 10/08/21 ?3382 10/09/21 ?0358  ?NA 130* 130* 131*  ?K 3.3* 3.5 3.9  ?CL 93* 95* 97*  ?CO2 28  27 27   ?GLUCOSE 224* 117* 128*  ?BUN 22* 20 19  ?CREATININE 1.17 1.05 1.00  ?CALCIUM 8.5* 8.5* 8.6*  ? ? ?  Latest Ref Rng & Units 10/09/2021  ?  3:58 AM 10/08/2021  ?  3:43 AM 10/07/2021  ?  1:36 PM  ?Hepatic Function  ?Total Protein 6.5 - 8.1 g/dL 5.7   6.7   6.8    ?Albumin 3.5 - 5.0 g/dL 2.5   2.4   2.6    ?AST 15 - 41 U/L 87   112   122    ?ALT 0 - 44 U/L 57   80   83    ?Alk Phosphatase 38 - 126 U/L 102   122   146    ?Total Bilirubin 0.3 - 1.2 mg/dL 2.9   4.4   3.6    ?  ? ?PT/INR ?Recent Labs  ?  10/07/21 ?1336  ?LABPROT 17.2*  ?INR 1.4*  ? ? ?Studies/Results: ?DG Chest 2 View ? ?Result Date: 10/07/2021 ?CLINICAL DATA:  Fluid overload. Shortness of breath. Supposed to get paracentesis today EXAM: CHEST - 2 VIEW COMPARISON:  AP chest 09/06/2021, 06/01/2021, and 08/22/2020 FINDINGS: Cardiac silhouette and mediastinal contours are within normal limits. Mildly decreased lung volumes. Mild chronic bilateral interstitial thickening is similar to multiple prior radiographs. Mild bibasilar bronchovascular crowding. No definite focal airspace opacity to indicate pneumonia. No pleural  effusion or pneumothorax. Mild-to-moderate multilevel degenerative disc changes of the thoracic spine. IMPRESSION: Mild bilateral interstitial thickening is similar to multiple prior radiographs, either mild interstitial pulmonary edema or chronic interstitial lung disease. Electronically Signed   By: Yvonne Kendall M.D.   On: 10/07/2021 14:02  ? ?IR Paracentesis ? ?Result Date: 10/08/2021 ?INDICATION: Patient history of NASH Velazquez, hemochromatosis, recurrent ascites. Request IR for diagnostic and therapeutic paracentesis. EXAM: ULTRASOUND GUIDED DIAGNOSTIC AND THERAPEUTIC PARACENTESIS MEDICATIONS: 10 mL 1% lidocaine COMPLICATIONS: None immediate. PROCEDURE: Informed written consent was obtained from the patient after a discussion of the risks, benefits and alternatives to treatment. A timeout was performed prior to the initiation of the  procedure. Initial ultrasound scanning demonstrates a moderate amount of ascites within the right upper abdominal quadrant. The right upper abdomen was prepped and draped in the usual sterile fashion. 1% lidocaine was used for local anesthesia. Following this, a 19 gauge, 7-cm, Yueh catheter was introduced. An ultrasound image was saved for documentation purposes. The paracentesis was performed. The catheter was removed and a dressing was applied. The patient tolerated the procedure well without immediate post procedural complication. FINDINGS: A total of approximately 2.4 L of cloudy yellow fluid was removed. Samples were sent to the laboratory as requested by the clinical team. IMPRESSION: Successful ultrasound-guided paracentesis yielding 2.4 liters of peritoneal fluid. Read by Candiss Norse, PA-C Electronically Signed   By: Lucrezia Europe M.D.   On: 10/08/2021 14:10   ? ? ? ? Assessment / Plan:   ?Assessment: ?1.  Darryl Velazquez: Increased lower extremity edema with weeping, recent adjustment of diuretics with discontinuation of Aldactone 100 mg daily and uptitrating of Lasix to 80 mg a.m. and 40 mg p.m. as of 10/07/2021, plan was to restart Aldactone to address lower extremity edema, status post 2.4 L paracentesis 10/08/2021 with no sign of SBP at the moment, status post every 6 hours IV albumin infusions x3 with no real improvement ?2.  Severe cramping especially in the legs but also involves the hands and abdomen ?3.  Hyponatremia and hypokalemia: are improving ? ?Plan: ?1.  Continue current dosing of diuretics. ?2.  Continue to monitor renal function ?3.  As per Dr. Havery Moros would recommend pursuing hepatology evaluation as outpatient for possible transplant given his MELD and fluid issues, apparently scheduled to see Dr. Zollie Scale next week ?4.  Patient is also being evaluated by IR for portal hypertension clinic ?5.  Continue low-sodium diet ? ?Thank you for your kind consultation, we will continue  to follow. ? ? ? LOS: 1 day  ? ?Lavone Nian Zamyia Gowell  10/09/2021, 12:23 PM ? ?  ?

## 2021-10-10 DIAGNOSIS — G4700 Insomnia, unspecified: Secondary | ICD-10-CM

## 2021-10-10 LAB — COMPREHENSIVE METABOLIC PANEL
ALT: 53 U/L — ABNORMAL HIGH (ref 0–44)
AST: 70 U/L — ABNORMAL HIGH (ref 15–41)
Albumin: 2.7 g/dL — ABNORMAL LOW (ref 3.5–5.0)
Alkaline Phosphatase: 112 U/L (ref 38–126)
Anion gap: 6 (ref 5–15)
BUN: 18 mg/dL (ref 6–20)
CO2: 28 mmol/L (ref 22–32)
Calcium: 8.7 mg/dL — ABNORMAL LOW (ref 8.9–10.3)
Chloride: 100 mmol/L (ref 98–111)
Creatinine, Ser: 0.99 mg/dL (ref 0.61–1.24)
GFR, Estimated: >60 mL/min (ref >60)
Glucose, Bld: 148 mg/dL — ABNORMAL HIGH (ref 70–99)
Potassium: 4 mmol/L (ref 3.5–5.1)
Sodium: 134 mmol/L — ABNORMAL LOW (ref 135–145)
Total Bilirubin: 2.8 mg/dL — ABNORMAL HIGH (ref 0.3–1.2)
Total Protein: 5.5 g/dL — ABNORMAL LOW (ref 6.5–8.1)

## 2021-10-10 LAB — CBC WITH DIFFERENTIAL/PLATELET
Abs Immature Granulocytes: 0.04 10*3/uL (ref 0.00–0.07)
Basophils Absolute: 0 10*3/uL (ref 0.0–0.1)
Basophils Relative: 1 %
Eosinophils Absolute: 0.2 10*3/uL (ref 0.0–0.5)
Eosinophils Relative: 2 %
HCT: 26.8 % — ABNORMAL LOW (ref 39.0–52.0)
Hemoglobin: 9.5 g/dL — ABNORMAL LOW (ref 13.0–17.0)
Immature Granulocytes: 1 %
Lymphocytes Relative: 16 %
Lymphs Abs: 1.2 10*3/uL (ref 0.7–4.0)
MCH: 29.9 pg (ref 26.0–34.0)
MCHC: 35.4 g/dL (ref 30.0–36.0)
MCV: 84.3 fL (ref 80.0–100.0)
Monocytes Absolute: 1.5 10*3/uL — ABNORMAL HIGH (ref 0.1–1.0)
Monocytes Relative: 20 %
Neutro Abs: 4.3 10*3/uL (ref 1.7–7.7)
Neutrophils Relative %: 60 %
Platelets: 140 10*3/uL — ABNORMAL LOW (ref 150–400)
RBC: 3.18 MIL/uL — ABNORMAL LOW (ref 4.22–5.81)
RDW: 18.5 % — ABNORMAL HIGH (ref 11.5–15.5)
WBC: 7.2 10*3/uL (ref 4.0–10.5)
nRBC: 0 % (ref 0.0–0.2)

## 2021-10-10 LAB — MAGNESIUM: Magnesium: 1.8 mg/dL (ref 1.7–2.4)

## 2021-10-10 LAB — PHOSPHORUS: Phosphorus: 3 mg/dL (ref 2.5–4.6)

## 2021-10-10 MED ORDER — ALBUMIN HUMAN 25 % IV SOLN
25.0000 g | Freq: Four times a day (QID) | INTRAVENOUS | Status: AC
  Administered 2021-10-10 (×2): 25 g via INTRAVENOUS
  Filled 2021-10-10 (×2): qty 100

## 2021-10-10 NOTE — Progress Notes (Signed)
? ? Progress Note ? ? Subjective  ?Chief Complaint: Decompensated cirrhosis ? ?Today, patient tells me that he feels about the same but he has noticed that the swelling everywhere has been getting some better.  Tells me he has been using Trazodone at night and feels like this is actually helping him get some sleep.  He wonders if he can get a prescription for this when he leaves.  He denies any new complaints or concerns. ? ? Objective  ? ?Vital signs in last 24 hours: ?Temp:  [97.7 ?F (36.5 ?C)-98.3 ?F (36.8 ?C)] 97.8 ?F (36.6 ?C) (05/05 2836) ?Pulse Rate:  [63-75] 66 (05/05 0834) ?Resp:  [17-18] 17 (05/05 0834) ?BP: (127-146)/(64-75) 127/67 (05/05 6294) ?SpO2:  [97 %-100 %] 98 % (05/05 0834) ?Weight:  [117.6 kg] 117.6 kg (05/05 0500) ?Last BM Date : 10/08/21 ?General:    male in NAD ?Heart:  Regular rate and rhythm; no murmurs ?Lungs: Respirations even and unlabored, lungs CTA bilaterally ?Abdomen:  Soft, nontender and nondistended. Normal bowel sounds. ?Extremities: Pitting edema in the right lower leg to the level of the shin ?Psych:  Cooperative. Normal mood and affect. ? ?Intake/Output from previous day: ?05/04 0701 - 05/05 0700 ?In: 903 [P.O.:820; I.V.:3; IV Piggyback:80] ?Out: 3200 [Urine:3200] ?Intake/Output this shift: ?Total I/O ?In: 72 [P.O.:460] ?Out: -  ? ?Lab Results: ?Recent Labs  ?  10/08/21 ?7654 10/09/21 ?6503 10/09/21 ?1359 10/10/21 ?0446  ?WBC 10.4 7.6  --  7.2  ?HGB 11.6* 9.4* 10.4* 9.5*  ?HCT 32.3* 26.9* 30.2* 26.8*  ?PLT 198 160  --  140*  ? ?BMET ?Recent Labs  ?  10/08/21 ?5465 10/09/21 ?6812 10/10/21 ?0446  ?NA 130* 131* 134*  ?K 3.5 3.9 4.0  ?CL 95* 97* 100  ?CO2 27 27 28   ?GLUCOSE 117* 128* 148*  ?BUN 20 19 18   ?CREATININE 1.05 1.00 0.99  ?CALCIUM 8.5* 8.6* 8.7*  ? ?LFT ?Recent Labs  ?  10/10/21 ?0446  ?PROT 5.5*  ?ALBUMIN 2.7*  ?AST 70*  ?ALT 53*  ?ALKPHOS 112  ?BILITOT 2.8*  ? ?PT/INR ?Recent Labs  ?  10/07/21 ?1336  ?LABPROT 17.2*  ?INR 1.4*  ? ? ?Studies/Results: ?IR  Paracentesis ? ?Result Date: 10/08/2021 ?INDICATION: Patient history of NASH cirrhosis, hemochromatosis, recurrent ascites. Request IR for diagnostic and therapeutic paracentesis. EXAM: ULTRASOUND GUIDED DIAGNOSTIC AND THERAPEUTIC PARACENTESIS MEDICATIONS: 10 mL 1% lidocaine COMPLICATIONS: None immediate. PROCEDURE: Informed written consent was obtained from the patient after a discussion of the risks, benefits and alternatives to treatment. A timeout was performed prior to the initiation of the procedure. Initial ultrasound scanning demonstrates a moderate amount of ascites within the right upper abdominal quadrant. The right upper abdomen was prepped and draped in the usual sterile fashion. 1% lidocaine was used for local anesthesia. Following this, a 19 gauge, 7-cm, Yueh catheter was introduced. An ultrasound image was saved for documentation purposes. The paracentesis was performed. The catheter was removed and a dressing was applied. The patient tolerated the procedure well without immediate post procedural complication. FINDINGS: A total of approximately 2.4 L of cloudy yellow fluid was removed. Samples were sent to the laboratory as requested by the clinical team. IMPRESSION: Successful ultrasound-guided paracentesis yielding 2.4 liters of peritoneal fluid. Read by Candiss Norse, PA-C Electronically Signed   By: Lucrezia Europe M.D.   On: 10/08/2021 14:10   ? ? ? ? Assessment / Plan:   ?Assessment: ?1.  Decompensated [place] cirrhosis: Increased lower extremity edema with weeping, recent adjustment of  diuretics, status post 2.4 L paracentesis 11/04/2021, has received IV albumin infusions and renal function remains stable with increase in diuretics since hospitalization ?2.  Severe cramping especially in the legs ?3.  Hyponatremia and hypokalemia: Improved ? ?Plan: ?1.  Continue current dose of diuretics including Aldactone 200 mg which was added yesterday ?2.  Continue to monitor renal function ?3.  Continue  low-sodium diet ?4.  Adjusting diuretics as a slow process and patient will need to stay until we make sure he is on the right regimen. ? ?Thank you for your kind consultation, we will continue to follow. ? ? ? LOS: 2 days  ? ?Lavone Nian Gunner Iodice  10/10/2021, 11:03 AM ? ?  ?

## 2021-10-10 NOTE — Plan of Care (Signed)
?  Problem: Clinical Measurements: ?Goal: Respiratory complications will improve ?Outcome: Progressing ?  ?Problem: Activity: ?Goal: Risk for activity intolerance will decrease ?Outcome: Progressing ?  ?Problem: Coping: ?Goal: Level of anxiety will decrease ?Outcome: Progressing ?  ?Problem: Elimination: ?Goal: Will not experience complications related to urinary retention ?Outcome: Progressing ?  ?

## 2021-10-10 NOTE — Assessment & Plan Note (Signed)
trazodone ?

## 2021-10-10 NOTE — Progress Notes (Signed)
?PROGRESS NOTE ? ? ? ?[name]  [name] DOB: 1960-08-11 DOA: 10/07/2021 ?PCP: [name], MD  ?Chief Complaint  ?Patient presents with  ? Leg Swelling  ? Nausea  ? ? ?Brief Narrative:  ? Darryl Velazquez is Darryl Velazquez 62 y.o. male with medical history significant of   ?cirrhosis secondary to NASH, chronic hyponatremia, hypoalbuminemia, hx of CVA, venous insufficiency, HLD, OSA, hx of prostate surgery  who presented to ED with complaints of bilateral leg swelling and weeping.  He's been admitted with worsening volume overload. ? ?See below for additional details  ? ? ?Assessment & Plan: ?  ?Principal Problem: ?  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  Hyponatremia ?  Anasarca ?  Hypokalemia ?  Hemochromatosis, hereditary ([place]) ?  Cramping of upper and lower extremities ?  Chronic venous insufficiency ?  Right inguinal hernia ?  Sleep apnea ?  Insomnia ? ? 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  ?-GI consulted, appreciate assistance ? [name] recommending hepatology evaluation outpatient for possible transplant, IR eval for portal hypertension (GI prefers he follow with hepatology first prior to IR referral) ?-continue IV lasix BID, continue spironolactone - will need continued uptitration of outpatient oral dosing outpatient (taking lasix 80 qam and 40 qpm at home as well as spironolactone 100 mg daily) ?- increase spironolactone to 200 mg daily ?-albumin as well with hypoalbuminemia ?- continue rifaximin, lactulose not on med list ?- s/p para with 2.4 L removed, not c/w SBP - cultures NGx2 ?- low sodium diet  ?- EGD (10/2020, inpatient at [facility]): No esophageal varices, portal hypertensive gastropathy ?- net negative 11.1 L out ? ?Anasarca ?Diuresis with lasix + albumin given hypoalbuminemia   ?spironolactone ? ?Hyponatremia ?Chronic in setting of volume overload with cirrhosis  ?Stable ?Continue with diuresis/paracentesis ?Trend   ? ?Hypokalemia ?Replace and follow ?Normal mag ? ?Cramping of upper and lower extremities ?Suspect this is related to his cirrhosis ?Seems improved with gabapentin ?Can try pickle juice as well ? 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 ?Chronic venous insufficiency ?04/2020: Evaluated by Dr. [name] in Cardiology clinic for new onset LE edema.  TTE unremarkable.  Started on diuretics. ?Leg elevation/TED hose ?Will have wound care also see ?Treat underlying decompensated cirrhosis  ? ? ?Right inguinal hernia ?Was seen by Dr. [name] in the General Surgery clinic on 09/05/2021 for evaluation of right inguinal hernia.  ?-decided poor candidate for hernia repair. ?  ? ?Sleep apnea ?Not using cpap machine  ? ?OSA on CPAP-resolved as of 10/07/2021 ?Not on cpap  ? ?Insomnia ?trazodone ? ? ?DVT prophylaxis: lovenox ?Code Status: full ?Family Communication: none ?Disposition:  ? ?Status is: inpatient ?The patient will require care spanning > 2 midnights and should be moved to inpatient because: additional diuresis ?  ?Consultants:  ?GI ? ?Procedures:  ?none ? ?Antimicrobials:  ?Anti-infectives (From admission, onward)  ? ? Start     Dose/Rate Route Frequency Ordered Stop  ? 10/07/21 2200  rifaximin (XIFAXAN) tablet 550 mg       ? 550 mg Oral 2 times daily 10/07/21 1926    ? ?  ? ? ?Subjective: ?No new complaints ? ?Objective: ?Vitals:  ? 10/10/21 0500 10/10/21 0513 10/10/21 0834 10/10/21 1659  ?BP:  128/64  127/67 137/69  ?Pulse:  63 66 70  ?Resp:  18 17 20   ?Temp:  97.7 ?F (36.5 ?C) 97.8 ?F (36.6 ?C) 98.5 ?F (36.9 ?C)  ?TempSrc:  Oral Oral Oral  ?SpO2:  97% 98% 100%  ?Weight: 117.6 kg     ?Height:      ? ? ?Intake/Output Summary (Last 24 hours) at 10/10/2021 1803 ?Last data filed at 10/10/2021  1716 ?Gross per 24 hour  ?Intake 940 ml  ?Output 5350 ml  ?Net -4410 ml  ? ?Filed Weights  ? 10/07/21 1249 10/08/21 2216 10/10/21 0500  ?Weight: 131.1 kg 124.6 kg 117.6 kg  ? ? ?Examination: ? ?General: No acute distress. ?Cardiovascular: RRR ?Lungs: unlabored ?Abdomen: Soft, nontender, distended  ?Neurological: Alert and oriented ?3. Moves all extremities ?4 . Cranial nerves II through XII grossly intact. ?Skin: Warm and dry. No rashes or lesions. ?Extremities: bilateral LE edema, dressing to LLE ? ? ?Data Reviewed: I have personally reviewed following labs and imaging studies ? ?CBC: ?Recent Labs  ?Lab 10/07/21 ?1336 10/08/21 ?4315 10/09/21 ?4008 10/09/21 ?1359 10/10/21 ?0446  ?WBC 9.8 10.4 7.6  --  7.2  ?NEUTROABS 7.0  --  4.6  --  4.3  ?HGB 11.8* 11.6* 9.4* 10.4* 9.5*  ?HCT 32.9* 32.3* 26.9* 30.2* 26.8*  ?MCV 83.9 82.8 84.6  --  84.3  ?PLT 213 198 160  --  140*  ? ? ?Basic Metabolic Panel: ?Recent Labs  ?Lab 10/07/21 ?1336 10/07/21 ?1725 10/08/21 ?6761 10/09/21 ?9509 10/10/21 ?0446  ?NA 130*  --  130* 131* 134*  ?K 3.3*  --  3.5 3.9 4.0  ?CL 93*  --  95* 97* 100  ?CO2 28  --  27 27 28   ?GLUCOSE 224*  --  117* 128* 148*  ?BUN 22*  --  20 19 18   ?CREATININE 1.17  --  1.05 1.00 0.99  ?CALCIUM 8.5*  --  8.5* 8.6* 8.7*  ?MG  --  2.0 2.0 1.9 1.8  ?PHOS  --   --  3.1 2.6 3.0  ? ? ?GFR: ?Estimated Creatinine Clearance: 111.2 mL/min (by C-G formula based on SCr of 0.99 mg/dL). ? ?Liver Function Tests: ?Recent Labs  ?Lab 10/07/21 ?1336 10/08/21 ?3267 10/09/21 ?1245 10/10/21 ?0446  ?AST 122* 112* 87* 70*  ?ALT 83* 80* 57* 53*  ?ALKPHOS 146* 122 102 112  ?BILITOT 3.6* 4.4* 2.9* 2.8*  ?PROT 6.8 6.7 5.7* 5.5*  ?ALBUMIN 2.6* 2.4* 2.5* 2.7*  ? ? ?CBG: ?No results for input(s): GLUCAP in the last 168 hours. ? ? ?Recent Results (from the past 240 hour(s))  ?Gram stain     Status: None  ? Collection Time: 10/08/21  1:42 PM  ? Specimen: Abdomen; Peritoneal Fluid  ?Result Value Ref Range Status  ? Specimen Description PERITONEAL  FLUID  Final  ? Special Requests ABDOMEN  Final  ? Gram Stain   Final  ?  WBC PRESENT,BOTH PMN AND MONONUCLEAR ?NO ORGANISMS SEEN ?CYTOSPIN SMEAR ?Performed at [facility], [place] [address] [place], [place] [address] ?  ? Report Status 10/08/2021 FINAL  Final  ?Culture, body fluid w Gram Stain-bottle     Status: None (Preliminary result)  ? Collection Time: 10/08/21  1:42 PM  ? Specimen: Peritoneal Washings  ?Result Value Ref Range Status  ? Specimen Description PERITONEAL FLUID  Final  ? Special Requests ABDOMEN  Final  ? Culture   Final  ?  NO GROWTH 2 DAYS ?Performed at [facility], [address] [place],  [address] ?  ? Report Status PENDING  Incomplete  ?  ? ? ? ? ? ?Radiology Studies: ?No results found. ? ? ? ? ? ?Scheduled Meds: ? famotidine  20 mg Oral BID  ? furosemide  80 mg Intravenous BID  ? gabapentin  100 mg Oral QHS  ? rifaximin  550 mg Oral BID  ? rosuvastatin  5 mg Oral QODAY  ? sodium chloride flush  3 mL Intravenous Q12H  ? spironolactone  200 mg Oral Daily  ? zinc sulfate  220 mg Oral Daily  ? ?Continuous Infusions: ? sodium chloride    ? albumin human 25 g (10/10/21 1715)  ? ? ? LOS: 2 days  ? ? ?Time spent: over 30 min ? ? ? ?Fayrene Helper, MD ?[facility] ? ? ?To contact the attending provider between 7A-7P or the covering provider during after hours 7P-7A, please log into the web site [email or web address] and access using universal [place] password for that web site. If you do not have the password, please call the hospital operator. ? ?10/10/2021, 6:03 PM  ? ? ?

## 2021-10-10 NOTE — Progress Notes (Addendum)
?  Transition of Care (TOC) Screening Note ? ? ?Patient Details  ?Name: Darryl Velazquez ?Date of Birth: 1961-03-26 ? ? ?Transition of Care (TOC) CM/SW Contact:    ?Tom-Johnson, Renea Ee, RN ?Phone Number: ?10/10/2021, 1:24 PM ? ?Patient is admitted for Decompensated Hepatic Cirrhosis 2/2 NASH. Patient states it's from [place] overload.  ?From home with wife, had two supportive children. Independent with care and drive self prior to admission. States he drove himself to the ED when his leg got severely swollen and weeping.  ?Patient states he is unemployed at this time due to his illness and has applied for disability, awaiting response.  ?[name], MD and uses [facility] in [place].  ? ?Transition of Care Department [facility]) has reviewed patient and no TOC needs or recommendations have been identified at this time. TOC will continue to monitor patient advancement through interdisciplinary progression rounds. If new patient transition needs arise, please place a TOC consult. ?  ?

## 2021-10-11 DIAGNOSIS — R600 Localized edema: Secondary | ICD-10-CM

## 2021-10-11 LAB — CBC WITH DIFFERENTIAL/PLATELET
Abs Immature Granulocytes: 0.03 10*3/uL (ref 0.00–0.07)
Basophils Absolute: 0 10*3/uL (ref 0.0–0.1)
Basophils Relative: 1 %
Eosinophils Absolute: 0.2 10*3/uL (ref 0.0–0.5)
Eosinophils Relative: 2 %
HCT: 27.3 % — ABNORMAL LOW (ref 39.0–52.0)
Hemoglobin: 9.4 g/dL — ABNORMAL LOW (ref 13.0–17.0)
Immature Granulocytes: 0 %
Lymphocytes Relative: 16 %
Lymphs Abs: 1.2 10*3/uL (ref 0.7–4.0)
MCH: 29.2 pg (ref 26.0–34.0)
MCHC: 34.4 g/dL (ref 30.0–36.0)
MCV: 84.8 fL (ref 80.0–100.0)
Monocytes Absolute: 1.5 10*3/uL — ABNORMAL HIGH (ref 0.1–1.0)
Monocytes Relative: 21 %
Neutro Abs: 4.4 10*3/uL (ref 1.7–7.7)
Neutrophils Relative %: 60 %
Platelets: 143 10*3/uL — ABNORMAL LOW (ref 150–400)
RBC: 3.22 MIL/uL — ABNORMAL LOW (ref 4.22–5.81)
RDW: 18.4 % — ABNORMAL HIGH (ref 11.5–15.5)
WBC: 7.3 10*3/uL (ref 4.0–10.5)
nRBC: 0 % (ref 0.0–0.2)

## 2021-10-11 LAB — COMPREHENSIVE METABOLIC PANEL
ALT: 51 U/L — ABNORMAL HIGH (ref 0–44)
AST: 65 U/L — ABNORMAL HIGH (ref 15–41)
Albumin: 3.1 g/dL — ABNORMAL LOW (ref 3.5–5.0)
Alkaline Phosphatase: 91 U/L (ref 38–126)
Anion gap: 5 (ref 5–15)
BUN: 20 mg/dL (ref 6–20)
CO2: 28 mmol/L (ref 22–32)
Calcium: 8.9 mg/dL (ref 8.9–10.3)
Chloride: 99 mmol/L (ref 98–111)
Creatinine, Ser: 0.98 mg/dL (ref 0.61–1.24)
GFR, Estimated: >60 mL/min (ref >60)
Glucose, Bld: 141 mg/dL — ABNORMAL HIGH (ref 70–99)
Potassium: 3.8 mmol/L (ref 3.5–5.1)
Sodium: 132 mmol/L — ABNORMAL LOW (ref 135–145)
Total Bilirubin: 3.4 mg/dL — ABNORMAL HIGH (ref 0.3–1.2)
Total Protein: 5.9 g/dL — ABNORMAL LOW (ref 6.5–8.1)

## 2021-10-11 LAB — PHOSPHORUS: Phosphorus: 3.3 mg/dL (ref 2.5–4.6)

## 2021-10-11 LAB — MAGNESIUM: Magnesium: 1.8 mg/dL (ref 1.7–2.4)

## 2021-10-11 MED ORDER — SPIRONOLACTONE 100 MG PO TABS: 200.0000 mg | ORAL_TABLET | Freq: Every day | ORAL | 1 refills | Status: AC

## 2021-10-11 MED ORDER — FUROSEMIDE 80 MG PO TABS
80.0000 mg | ORAL_TABLET | Freq: Two times a day (BID) | ORAL | 1 refills | Status: DC
Start: 2021-10-11 — End: 2021-11-24

## 2021-10-11 MED ORDER — TRAZODONE HCL 50 MG PO TABS: 50.0000 mg | ORAL_TABLET | Freq: Every evening | ORAL | 1 refills | Status: AC | PRN

## 2021-10-11 MED ORDER — GABAPENTIN 100 MG PO CAPS: 100.0000 mg | ORAL_CAPSULE | Freq: Every day | ORAL | 1 refills | Status: AC

## 2021-10-11 NOTE — Discharge Summary (Signed)
Physician Discharge Summary  ?MARKELLE NAJARIAN ZOX:[id number] DOB: Oct 23, 1960 DOA: 10/07/2021 ? ?PCP: [name], MD ? ?Admit date: 10/07/2021 ?Discharge date: 10/11/2021 ? ?Time spent: 40 minutes ? ?Recommendations for Outpatient Follow-up:  ?Follow outpatient CBC/CMP  ?Repeat labs in 1-2 weeks, follow renal function and lytes with adjustment of diuretics ?Refused to stay for RLE [place], would follow R>LLE edema outpatient, follow up ultrasound to r/o DVT ?Follow with hepatology as planned ?Consider IR follow up as recommended by GI ?Follow cramping, discharging with gabapentin, instructions for pickle juice  ? ?Discharge Diagnoses:  ?Principal Problem: ?  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  Hyponatremia ?  Anasarca ?  Hypokalemia ?  Edema of right lower extremity ?  Hemochromatosis, hereditary ([place]) ?  Cramping of upper and lower extremities ?  Chronic venous insufficiency ?  Right inguinal hernia ?  Sleep apnea ?  Insomnia ? ? ?Discharge Condition: satble ? ?Diet recommendation: low sodium ? ?Filed Weights  ? 10/08/21 2216 10/10/21 0500 10/11/21 0413  ?Weight: 124.6 kg 117.6 kg 117.2 kg  ? ? ?History of present illness:  ? Darryl Velazquez is Darryl Velazquez 61 y.o. male with medical history significant of   ?cirrhosis secondary to NASH, chronic hyponatremia, hypoalbuminemia, hx of CVA, venous insufficiency, HLD, OSA, hx of prostate surgery  who presented to ED with complaints of bilateral leg swelling and weeping.  He's been admitted with worsening volume overload. ? ?He's improved after paracentesis and IV lasix with albumin.  His spironolactone was increased.  He was discharged on 5/6 with increased oral diuretic regimen.  It was recommended he have right lower extremity US prior to discharge, but he refused this and said he would leave AMA if he weren't discharged.  Education provided regarding what we were looking for and he notes understanding.  Recommended follow up with outpatient providers  and pursuing outpatient [place] if unilateral leg swelling doesn't improve. ? ?See below for additional details ? 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  ?-GI consulted, appreciate assistance ? [name] recommending hepatology evaluation outpatient for possible transplant, IR eval for portal hypertension (GI prefers he follow with hepatology first prior to IR referral) ?- discharge on lasix PO 80 mg BID  ?- discharge on spironolactone 200 mg daily ?- continue to uptitrate with GI/PCP outpatient as needed for volume overload ?-albumin as well with hypoalbuminemia ?- continue rifaximin, lactulose not on med list ?- s/p para with 2.4 L removed, not c/w SBP - cultures NGx3.  No malignant cells on path. ?- low sodium diet  ?- EGD (10/2020, inpatient at [facility]): No esophageal varices, portal hypertensive gastropathy ?- net negative 13.1 L out ? ?Anasarca ?Improved ?Increase doses of lasix/spironolactone at discharge as above ? ?Hyponatremia ?Chronic in setting of volume overload with cirrhosis  ?Stable ?Continue with diuresis/paracentesis ?Trend, follow outpatient ? ?Edema of right lower extremity ?Follow ultrasound, suspect this is related to LLE being under compression with wrap and RLE not being wrapped.  [place] not done prior to discharge, I recommended he stay for the completion of this, but he refused.  Discussed need to follow up with outpatient providers for [place] and return precautions given. ?Continue diuretics as above, can wrap if desired, but I don't think this is absolutely necessary ? ?Hypokalemia ?Follow outpatient ? ?Cramping of upper and lower  extremities ?Suspect this is related to his cirrhosis ?Seems improved with gabapentin ?Can try pickle juice as well outpatient ? ?Hemochromatosis, hereditary ([place]) ?Follows in the Hematology clinic  for hemochromatosis with heterozygous H63D mutation ?Weekly phlebotomy  ? ?Chronic venous insufficiency ?04/2020: Evaluated by Dr. [name] in Cardiology clinic for new onset LE edema.  TTE unremarkable.  Started on diuretics. ?Leg elevation/TED hose ?Will have wound care also see ?Treat underlying decompensated cirrhosis  ? ? ?Right inguinal hernia ?Was seen by Dr. [name] in the General Surgery clinic on 09/05/2021 for evaluation of right inguinal hernia.  ?-decided poor candidate for hernia repair. ?  ? ?Sleep apnea ?Not using cpap machine  ? ?OSA on CPAP-resolved as of 10/07/2021 ?Not on cpap  ? ?Insomnia ?trazodone ? ? ?Procedures: ?paracentesis  ? ?Consultations: ?IR ?GI ? ?Discharge Exam: ?Vitals:  ? 10/11/21 1011 10/11/21 1718  ?BP: 136/82 (!) 156/69  ?Pulse: 67 78  ?Resp: 18 19  ?Temp: 98.4 ?F (36.9 ?C) 98.5 ?F (36.9 ?C)  ?SpO2: 99% 100%  ? ?Eager to discharge ?Adamant about not staying for lower extremity ultrasound.  Encouraged to stay, but he refused and noted he'd leave AMA.  Encouraged to follow outpatient [place]. ? ?General: No acute distress. ?Cardiovascular: RRR ?Lungs: unlabored ?Abdomen: Soft, nontender, ?Neurological: Alert and oriented ?3. Moves all extremities ?4 . Cranial nerves II through XII grossly intact. ?Skin: Warm and dry. No rashes or lesions. ?Extremities: RLE edema > left ? ?Discharge Instructions ? ? ?Discharge Instructions   ? ? (HEART FAILURE PATIENTS) Call MD:  Anytime you have any of the following symptoms: 1) 3 pound weight gain in 24 hours or 5 pounds in 1 week 2) shortness of breath, with or without Angelette Ganus dry hacking cough 3) swelling in the hands, feet or stomach 4) if you have to sleep on extra pillows at night in order to breathe.   Complete by: As directed ?  ? Call MD for:  difficulty breathing, headache or visual disturbances   Complete by: As directed ?  ? Call MD for:  extreme fatigue   Complete by: As directed ?  ? Call MD for:  hives   Complete by: As directed ?  ? Call MD for:   persistant dizziness or light-headedness   Complete by: As directed ?  ? Call MD for:  persistant nausea and vomiting   Complete by: As directed ?  ? Call MD for:  redness, tenderness, or signs of infection (pain, swelling, redness, odor or green/yellow discharge around incision site)   Complete by: As directed ?  ? Call MD for:  severe uncontrolled pain   Complete by: As directed ?  ? Call MD for:  temperature >100.4   Complete by: As directed ?  ? Diet - low sodium heart healthy   Complete by: As directed ?  ? Discharge instructions   Complete by: As directed ?  ? You were seen for swelling in your legs. ? ?You've improved with diuretics.  I want to rule out Dearra Myhand blood clot to your right leg because the swelling on that side is worse than the left, but you have requested your discharge.  If your right leg swelling doesn't improve, you should get an ultrasound with your outpatient PCP or Dr. Bryan Lemma.  If you have worsening swelling or pain or chest pain or shortness of breath or other concerning symptoms, return to the hospital immediately. ? ?We're going to increase your lasix to 80 mg twice Alleah Dearman day.  We'll increase your spironolactone to 200 mg daily.  You'll need to repeat labs in 1-2 weeks to make sure your kidney function and electrolytes are stable (follow up with your PCP or Dr. Bryan Lemma).  ? ?For your cramping, I'll prescribe low dose gabapentin at night.  We'll prescribe trazodone as needed for sleep.  You can use Bryson Palen spoonful of pickle juice at the onset of cramping.  ? ?Follow up as scheduled with the liver doctors for your transplant evaluation.  Dr. Bryan Lemma may want you to follow up with interventional radiology at some point as well. ? ?Follow up with your PCP and gastroenterology as an outpatient.   ? ?Return for new, recurrent, or worsening symptoms. ? ?Please ask your PCP to request records from this hospitalization so they know what was done and what the next steps will be.  ? Increase activity  slowly   Complete by: As directed ?  ? No wound care   Complete by: As directed ?  ? ?  ? ?Allergies as of 10/11/2021   ?No Known Allergies ?  ? ?  ?Medication List  ?  ? ?TAKE these medications   ? ?docus

## 2021-10-11 NOTE — Progress Notes (Signed)
?PROGRESS NOTE ? ? ? ?Darryl Velazquez  EZM:[id number] DOB: [birth year] DOA: 10/07/2021 ?PCP: [name], MD  ?Chief Complaint  ?Patient presents with  ? Leg Swelling  ? Nausea  ? ? ?Brief Narrative:  ? Darryl Velazquez is Darryl Velazquez 61 y.o. male with medical history significant of   ?cirrhosis secondary to NASH, chronic hyponatremia, hypoalbuminemia, hx of CVA, venous insufficiency, HLD, OSA, hx of prostate surgery  who presented to ED with complaints of bilateral leg swelling and weeping.  He's been admitted with worsening volume overload. ? ?See below for additional details  ? ? ?Assessment & Plan: ?  ?Principal Problem: ?  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  Hyponatremia ?  Anasarca ?  Hypokalemia ?  Edema of right lower extremity ?  Hemochromatosis, hereditary ([place]) ?  Cramping of upper and lower extremities ?  Chronic venous insufficiency ?  Right inguinal hernia ?  Sleep apnea ?  Insomnia ? ? 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  ?-GI consulted, appreciate assistance ? [name] recommending hepatology evaluation outpatient for possible transplant, IR eval for portal hypertension (GI prefers he follow with hepatology first prior to IR referral) ?-continue IV lasix BID, continue spironolactone - will need continued uptitration of outpatient oral dosing outpatient (taking lasix 80 qam and 40 qpm at home as well as spironolactone 100 mg daily) ?- increase spironolactone to 200 mg daily ?-albumin as well with hypoalbuminemia ?- continue rifaximin, lactulose not on med list ?- s/p para with 2.4 L removed, not c/w SBP - cultures NGx3 ?- low sodium diet  ?- EGD (10/2020, inpatient at [facility]): No esophageal varices, portal hypertensive gastropathy ?- net negative 13.1 L out ? ?Anasarca ?IV lasix (hold further albumin, now his is  >3) ?spironolactone ? ?Hyponatremia ?Chronic in setting of volume overload with cirrhosis  ?Stable ?Continue with diuresis/paracentesis ?Trend   ? ?Edema of right lower extremity ?Follow ultrasound, suspect this is related to LLE being under compression with wrap and RLE not being wrapped ? ?Hypokalemia ?Replace and follow ?Normal mag ? ?Cramping of upper and lower extremities ?Suspect this is related to his cirrhosis ?Seems improved with gabapentin ?Can try pickle juice as well ? ?Hemochromatosis, hereditary ([place]) ?Follows in the Hematology clinic for hemochromatosis with heterozygous H63D mutation ?Weekly phlebotomy  ? ?Chronic venous insufficiency ?04/2020: Evaluated by Dr. [name] in Cardiology clinic for new onset LE edema.  TTE unremarkable.  Started on diuretics. ?Leg elevation/TED hose ?Will have wound care also see ?Treat underlying decompensated cirrhosis  ? ? ?Right inguinal hernia ?Was seen by Dr. [name] in the General Surgery clinic on 09/05/2021 for evaluation of right inguinal hernia.  ?-decided poor candidate for hernia repair. ?  ? ?Sleep apnea ?Not using cpap machine  ? ?OSA on CPAP-resolved as of 10/07/2021 ?Not on cpap  ? ?Insomnia ?trazodone ? ? ?DVT prophylaxis: lovenox ?Code Status: full ?Family Communication: none ?Disposition:  ? ?Status is: inpatient ?The patient will require care spanning > 2 midnights and should be moved to inpatient because: additional diuresis - awaiting RLE [place] ?  ?Consultants:  ?GI ? ?Procedures:  ?none ? ?Antimicrobials:  ?Anti-infectives (From admission, onward)  ? ? Start     Dose/Rate Route Frequency Ordered Stop  ? 10/07/21 2200  rifaximin (XIFAXAN) tablet 550 mg       ? 550  mg Oral 2 times daily 10/07/21 1926    ? ?  ? ? ?Subjective: ?Wants to go home if possible ? ?Objective: ?Vitals:  ? 10/10/21 2022 10/11/21 0413 10/11/21 0455 10/11/21 1011  ?BP: (!) 146/81  140/72 136/82  ?Pulse: 72  67 67  ?Resp: 18  18 18   ?Temp: 98.4 ?F (36.9 ?C)  98.2 ?F (36.8 ?C) 98.4 ?F  (36.9 ?C)  ?TempSrc: Oral  Oral Oral  ?SpO2: 97%  100% 99%  ?Weight:  117.2 kg    ?Height:      ? ? ?Intake/Output Summary (Last 24 hours) at 10/11/2021 1650 ?Last data filed at 10/11/2021 1500 ?Gross per 24 hour  ?Intake 890.02 ml  ?Output 3650 ml  ?Net -2759.98 ml  ? ?Filed Weights  ? 10/08/21 2216 10/10/21 0500 10/11/21 0413  ?Weight: 124.6 kg 117.6 kg 117.2 kg  ? ? ?Examination: ? ?General: No acute distress. ?Cardiovascular: RRR ?Lungs: unlabored ?Abdomen: Soft, nontender ?Neurological: Alert and oriented ?3. Moves all extremities ?4 Cranial nerves II through XII grossly intact. ?Extremities: RLE edema greater than left ? ? ? ?Data Reviewed: I have personally reviewed following labs and imaging studies ? ?CBC: ?Recent Labs  ?Lab 10/07/21 ?1336 10/08/21 ?3762 10/09/21 ?8315 10/09/21 ?1359 10/10/21 ?0446 10/11/21 ?0425  ?WBC 9.8 10.4 7.6  --  7.2 7.3  ?NEUTROABS 7.0  --  4.6  --  4.3 4.4  ?HGB 11.8* 11.6* 9.4* 10.4* 9.5* 9.4*  ?HCT 32.9* 32.3* 26.9* 30.2* 26.8* 27.3*  ?MCV 83.9 82.8 84.6  --  84.3 84.8  ?PLT 213 198 160  --  140* 143*  ? ? ?Basic Metabolic Panel: ?Recent Labs  ?Lab 10/07/21 ?1336 10/07/21 ?1725 10/08/21 ?1761 10/09/21 ?6073 10/10/21 ?7106 10/11/21 ?0425  ?NA 130*  --  130* 131* 134* 132*  ?K 3.3*  --  3.5 3.9 4.0 3.8  ?CL 93*  --  95* 97* 100 99  ?CO2 28  --  27 27 28 28   ?GLUCOSE 224*  --  117* 128* 148* 141*  ?BUN 22*  --  20 19 18 20   ?CREATININE 1.17  --  1.05 1.00 0.99 0.98  ?CALCIUM 8.5*  --  8.5* 8.6* 8.7* 8.9  ?MG  --  2.0 2.0 1.9 1.8 1.8  ?PHOS  --   --  3.1 2.6 3.0 3.3  ? ? ?GFR: ?Estimated Creatinine Clearance: 112.2 mL/min (by C-G formula based on SCr of 0.98 mg/dL). ? ?Liver Function Tests: ?Recent Labs  ?Lab 10/07/21 ?1336 10/08/21 ?2694 10/09/21 ?8546 10/10/21 ?2703 10/11/21 ?0425  ?AST 122* 112* 87* 70* 65*  ?ALT 83* 80* 57* 53* 51*  ?ALKPHOS 146* 122 102 112 91  ?BILITOT 3.6* 4.4* 2.9* 2.8* 3.4*  ?PROT 6.8 6.7 5.7* 5.5* 5.9*  ?ALBUMIN 2.6* 2.4* 2.5* 2.7* 3.1*  ? ? ?CBG: ?No results  for input(s): GLUCAP in the last 168 hours. ? ? ?Recent Results (from the past 240 hour(s))  ?Gram stain     Status: None  ? Collection Time: 10/08/21  1:42 PM  ? Specimen: Abdomen; Peritoneal Fluid  ?Result Value Ref Range Status  ? Specimen Description PERITONEAL FLUID  Final  ? Special Requests ABDOMEN  Final  ? Gram Stain   Final  ?  WBC PRESENT,BOTH PMN AND MONONUCLEAR ?NO ORGANISMS SEEN ?CYTOSPIN SMEAR ?Performed at [facility], [place] [address] [place], [place] [address] ?  ? Report Status 10/08/2021 FINAL  Final  ?Culture, body fluid w Gram Stain-bottle     Status:  None (Preliminary result)  ? Collection Time: 10/08/21  1:42 PM  ? Specimen: Peritoneal Washings  ?Result Value Ref Range Status  ? Specimen Description PERITONEAL FLUID  Final  ? Special Requests ABDOMEN  Final  ? Culture   Final  ?  NO GROWTH 3 DAYS ?Performed at [facility], [place] [address], [place],  [address] ?  ? Report Status PENDING  Incomplete  ?  ? ? ? ? ? ?Radiology Studies: ?No results found. ? ? ? ? ? ?Scheduled Meds: ? famotidine  20 mg Oral BID  ? furosemide  80 mg Intravenous BID  ? gabapentin  100 mg Oral QHS  ? rifaximin  550 mg Oral BID  ? rosuvastatin  5 mg Oral QODAY  ? sodium chloride flush  3 mL Intravenous Q12H  ? spironolactone  200 mg Oral Daily  ? zinc sulfate  220 mg Oral Daily  ? ?Continuous Infusions: ? sodium chloride    ? ? ? LOS: 3 days  ? ? ?Time spent: over 30 min ? ? ? ?Fayrene Helper, MD ?[facility] ? ? ?To contact the attending provider between 7A-7P or the covering provider during after hours 7P-7A, please log into the web site [email or web address] and access using universal [place] password for that web site. If you do not have the password, please call the hospital operator. ? ?10/11/2021, 4:50 PM  ? ? ?

## 2021-10-11 NOTE — Plan of Care (Signed)

## 2021-10-11 NOTE — Assessment & Plan Note (Addendum)
Follow ultrasound, suspect this is related to LLE being under compression with wrap and RLE not being wrapped.  [place] not done prior to discharge, I recommended he stay for the completion of this, but he refused.  Discussed need to follow up with outpatient providers for [place] and return precautions given. ?Continue diuretics as above, can wrap if desired, but I don't think this is absolutely necessary ?

## 2021-10-13 LAB — CULTURE, BODY FLUID W GRAM STAIN -BOTTLE: Culture: NO GROWTH

## 2021-10-21 DIAGNOSIS — R188 Other ascites: Secondary | ICD-10-CM | POA: Diagnosis not present

## 2021-10-21 DIAGNOSIS — K7469 Other cirrhosis of liver: Secondary | ICD-10-CM | POA: Diagnosis not present

## 2021-10-21 DIAGNOSIS — K7682 Hepatic encephalopathy: Secondary | ICD-10-CM | POA: Diagnosis not present

## 2021-10-23 DIAGNOSIS — R600 Localized edema: Secondary | ICD-10-CM | POA: Diagnosis not present

## 2021-10-23 DIAGNOSIS — M79604 Pain in right leg: Secondary | ICD-10-CM | POA: Diagnosis not present

## 2021-10-23 DIAGNOSIS — K7460 Unspecified cirrhosis of liver: Secondary | ICD-10-CM | POA: Diagnosis not present

## 2021-10-23 DIAGNOSIS — M7989 Other specified soft tissue disorders: Secondary | ICD-10-CM | POA: Diagnosis not present

## 2021-10-23 DIAGNOSIS — M79661 Pain in right lower leg: Secondary | ICD-10-CM | POA: Diagnosis not present

## 2021-10-24 DIAGNOSIS — M545 Low back pain, unspecified: Secondary | ICD-10-CM | POA: Diagnosis not present

## 2021-10-24 DIAGNOSIS — S3992XA Unspecified injury of lower back, initial encounter: Secondary | ICD-10-CM | POA: Diagnosis not present

## 2021-10-24 DIAGNOSIS — W06XXXA Fall from bed, initial encounter: Secondary | ICD-10-CM | POA: Diagnosis not present

## 2021-10-24 DIAGNOSIS — K7469 Other cirrhosis of liver: Secondary | ICD-10-CM | POA: Diagnosis not present

## 2021-10-24 DIAGNOSIS — R9431 Abnormal electrocardiogram [ECG] [EKG]: Secondary | ICD-10-CM | POA: Diagnosis not present

## 2021-10-24 DIAGNOSIS — R112 Nausea with vomiting, unspecified: Secondary | ICD-10-CM | POA: Diagnosis not present

## 2021-10-24 DIAGNOSIS — K7460 Unspecified cirrhosis of liver: Secondary | ICD-10-CM | POA: Diagnosis not present

## 2021-10-25 DIAGNOSIS — K7460 Unspecified cirrhosis of liver: Secondary | ICD-10-CM | POA: Diagnosis not present

## 2021-10-25 DIAGNOSIS — R112 Nausea with vomiting, unspecified: Secondary | ICD-10-CM | POA: Diagnosis not present

## 2021-10-25 DIAGNOSIS — R9431 Abnormal electrocardiogram [ECG] [EKG]: Secondary | ICD-10-CM | POA: Diagnosis not present

## 2021-10-25 DIAGNOSIS — S3992XA Unspecified injury of lower back, initial encounter: Secondary | ICD-10-CM | POA: Diagnosis not present

## 2021-10-29 ENCOUNTER — Other Ambulatory Visit: Payer: Self-pay | Admitting: Family

## 2021-10-31 ENCOUNTER — Inpatient Hospital Stay (HOSPITAL_BASED_OUTPATIENT_CLINIC_OR_DEPARTMENT_OTHER): Payer: BC Managed Care – PPO | Admitting: Family

## 2021-10-31 ENCOUNTER — Telehealth: Payer: Self-pay

## 2021-10-31 ENCOUNTER — Other Ambulatory Visit: Payer: Self-pay

## 2021-10-31 ENCOUNTER — Inpatient Hospital Stay: Payer: BC Managed Care – PPO | Attending: Hematology & Oncology

## 2021-10-31 ENCOUNTER — Encounter: Payer: Self-pay | Admitting: Family

## 2021-10-31 DIAGNOSIS — K7460 Unspecified cirrhosis of liver: Secondary | ICD-10-CM | POA: Insufficient documentation

## 2021-10-31 LAB — CMP (CANCER CENTER ONLY)
ALT: 44 U/L (ref 0–44)
AST: 55 U/L — ABNORMAL HIGH (ref 15–41)
Albumin: 3.3 g/dL — ABNORMAL LOW (ref 3.5–5.0)
Alkaline Phosphatase: 159 U/L — ABNORMAL HIGH (ref 38–126)
Anion gap: 8 (ref 5–15)
BUN: 29 mg/dL — ABNORMAL HIGH (ref 6–20)
CO2: 26 mmol/L (ref 22–32)
Calcium: 9.7 mg/dL (ref 8.9–10.3)
Chloride: 95 mmol/L — ABNORMAL LOW (ref 98–111)
Creatinine: 1.35 mg/dL — ABNORMAL HIGH (ref 0.61–1.24)
GFR, Estimated: >60 mL/min (ref >60)
Glucose, Bld: 197 mg/dL — ABNORMAL HIGH (ref 70–99)
Potassium: 4.4 mmol/L (ref 3.5–5.1)
Sodium: 129 mmol/L — ABNORMAL LOW (ref 135–145)
Total Bilirubin: 3.9 mg/dL (ref 0.3–1.2)
Total Protein: 6.9 g/dL (ref 6.5–8.1)

## 2021-10-31 LAB — CBC WITH DIFFERENTIAL (CANCER CENTER ONLY)
Abs Immature Granulocytes: 0.02 10*3/uL (ref 0.00–0.07)
Basophils Absolute: 0.1 10*3/uL (ref 0.0–0.1)
Basophils Relative: 1 %
Eosinophils Absolute: 0.2 10*3/uL (ref 0.0–0.5)
Eosinophils Relative: 3 %
HCT: 32 % — ABNORMAL LOW (ref 39.0–52.0)
Hemoglobin: 11 g/dL — ABNORMAL LOW (ref 13.0–17.0)
Immature Granulocytes: 0 %
Lymphocytes Relative: 14 %
Lymphs Abs: 1 10*3/uL (ref 0.7–4.0)
MCH: 29.6 pg (ref 26.0–34.0)
MCHC: 34.4 g/dL (ref 30.0–36.0)
MCV: 86 fL (ref 80.0–100.0)
Monocytes Absolute: 1.2 10*3/uL — ABNORMAL HIGH (ref 0.1–1.0)
Monocytes Relative: 16 %
Neutro Abs: 4.8 10*3/uL (ref 1.7–7.7)
Neutrophils Relative %: 66 %
Platelet Count: 150 10*3/uL (ref 150–400)
RBC: 3.72 MIL/uL — ABNORMAL LOW (ref 4.22–5.81)
RDW: 17.7 % — ABNORMAL HIGH (ref 11.5–15.5)
WBC Count: 7.2 10*3/uL (ref 4.0–10.5)
nRBC: 0 % (ref 0.0–0.2)

## 2021-10-31 LAB — FERRITIN: Ferritin: 175 ng/mL (ref 24–336)

## 2021-10-31 LAB — IRON AND IRON BINDING CAPACITY (CC-WL,HP ONLY)
Iron: 252 ug/dL — ABNORMAL HIGH (ref 45–182)
Saturation Ratios: 102 % — ABNORMAL HIGH (ref 17.9–39.5)
TIBC: 248 ug/dL — ABNORMAL LOW (ref 250–450)
UIBC: UNDETERMINED ug/dL (ref 117–376)

## 2021-10-31 NOTE — Progress Notes (Signed)
Hematology and Oncology Follow Up Visit  Darryl Velazquez [id number] 03-Sep-1960 61 y.o. 10/31/2021   Principle Diagnosis:  Hemochromatosis, heterozygous for the H63D mutation  Cirrhosis - NASH   Current Therapy:        Phlebotomy with One Blood PRN to maintain ferritin < 100 and iron saturation < 50%   Interim History:  Darryl Velazquez is here today for follow-up. He is symptomatic with fatigue, weakness, pain in the abdomen and back. He was hospitalized in early may with fluid overload with ascites associated with burned out NASH.  He had several paracentesis during admission and his fluid retention at this time seems to be controlled on diuretics.  He is now seeing transplant hepatology and is waiting to schedule his work up in [place] to get him on the transplant list.  He has some nausea at times and does not have much of an appetite. He is doing his best to hydrate appropriately. Weight today is 241 lbs.  No numbness or tingling in his extremities.  No falls or syncope to report.  No blood loss, abnormal bruising or petechiae noted.   ECOG Performance Status: 1 - Symptomatic but completely ambulatory  Medications:  Allergies as of 10/31/2021   No Known Allergies      Medication List        Accurate as of Oct 31, 2021 10:13 AM. If you have any questions, ask your nurse or doctor.          docusate sodium 100 MG capsule Commonly known as: Colace Take 1 capsule (100 mg total) by mouth 2 (two) times daily.   famotidine 20 MG tablet Commonly known as: PEPCID Take 20 mg by mouth 2 (two) times daily.   furosemide 80 MG tablet Commonly known as: LASIX Take 1 tablet (80 mg total) by mouth 2 (two) times daily.   gabapentin 100 MG capsule Commonly known as: NEURONTIN Take 1 capsule (100 mg total) by mouth at bedtime.   methocarbamol 500 MG tablet Commonly known as: ROBAXIN Take 500 mg by mouth in the morning and at bedtime.   ondansetron 4 MG disintegrating  tablet Commonly known as: ZOFRAN-ODT Take 4 mg by mouth every 8 (eight) hours as needed for nausea or vomiting.   rifaximin 550 MG Tabs tablet Commonly known as: XIFAXAN Take 1 tablet (550 mg total) by mouth 2 (two) times daily.   rosuvastatin 10 MG tablet Commonly known as: CRESTOR Take 10 mg by mouth daily.   spironolactone 100 MG tablet Commonly known as: Aldactone Take 2 tablets (200 mg total) by mouth daily.   Taurine 1000 MG Caps Take 1 capsule by mouth 2 (two) times daily.   traZODone 50 MG tablet Commonly known as: DESYREL Take 1 tablet (50 mg total) by mouth at bedtime as needed for sleep.   zinc gluconate 50 MG tablet Take 50 mg by mouth daily.        Allergies: No Known Allergies  Past Medical History, Surgical history, Social history, and Family History were reviewed and updated.  Review of Systems: All other 10 point review of systems is negative.   Physical Exam:  vitals were not taken for this visit.   Wt Readings from Last 3 Encounters:  10/11/21 258 lb 6.1 oz (117.2 kg)  09/24/21 283 lb 8 oz (128.6 kg)  08/19/21 281 lb 2 oz (127.5 kg)    Ocular: Sclerae unicteric, pupils equal, round and reactive to light Ear-nose-throat: Oropharynx clear, dentition fair Lymphatic: No cervical  or supraclavicular adenopathy Lungs no rales or rhonchi, good excursion bilaterally Heart regular rate and rhythm, no murmur appreciated Abd soft, nontender, positive bowel sounds MSK no focal spinal tenderness, no joint edema Neuro: non-focal, well-oriented, appropriate affect Breasts: Deferred   Lab Results  Component Value Date   WBC 7.3 10/11/2021   HGB 9.4 (L) 10/11/2021   HCT 27.3 (L) 10/11/2021   MCV 84.8 10/11/2021   PLT 143 (L) 10/11/2021   Lab Results  Component Value Date   FERRITIN 97 07/02/2021   IRON 68 07/02/2021   TIBC 294 07/02/2021   UIBC 226 07/02/2021   IRONPCTSAT 23 07/02/2021   Lab Results  Component Value Date   RBC 3.22 (L)  10/11/2021   No results found for: KPAFRELGTCHN, LAMBDASER, [facility] Lab Results  Component Value Date   IGGSERUM 2,209 (H) 05/08/2020   IGMSERUM 195 05/08/2020   No results found for: Kathrynn Ducking, MSPIKE, SPEI   Chemistry      Component Value Date/Time   NA 132 (L) 10/11/2021 0425   NA 140 04/19/2020 1458   K 3.8 10/11/2021 0425   CL 99 10/11/2021 0425   CO2 28 10/11/2021 0425   BUN 20 10/11/2021 0425   BUN 11 04/19/2020 1458   CREATININE 0.98 10/11/2021 0425   CREATININE 0.80 07/02/2021 1327      Component Value Date/Time   CALCIUM 8.9 10/11/2021 0425   ALKPHOS 91 10/11/2021 0425   AST 65 (H) 10/11/2021 0425   AST 40 07/02/2021 1327   ALT 51 (H) 10/11/2021 0425   ALT 26 07/02/2021 1327   BILITOT 3.4 (H) 10/11/2021 0425   BILITOT 2.6 (H) 07/02/2021 1327       Impression and Plan: Darryl Velazquez is a very pleasant 61 yo caucasian gentleman with cirrhosis and hemochromatosis, heterozygous for the H63D mutations.  Iron saturation is 102% so we will get him set up for weekly phlebotomy x 3 and recheck labs in 4 weeks.  Follow-up in 8 weeks.   Lottie Dawson, NP 5/26/202310:13 AM

## 2021-10-31 NOTE — Telephone Encounter (Signed)
Critical total bilirubin of 3.9 received from lab, NP aware.

## 2021-11-07 ENCOUNTER — Ambulatory Visit: Payer: BC Managed Care – PPO | Admitting: Family

## 2021-11-07 ENCOUNTER — Other Ambulatory Visit: Payer: BC Managed Care – PPO

## 2021-11-07 ENCOUNTER — Telehealth: Payer: Self-pay | Admitting: *Deleted

## 2021-11-07 NOTE — Telephone Encounter (Signed)
Per 10/31/21 los - gave upcoming appointments - confirmed - mailed calendar

## 2021-11-14 ENCOUNTER — Inpatient Hospital Stay: Payer: BC Managed Care – PPO | Attending: Hematology & Oncology

## 2021-11-14 ENCOUNTER — Other Ambulatory Visit: Payer: Self-pay | Admitting: Family

## 2021-11-14 NOTE — Patient Instructions (Signed)

## 2021-11-14 NOTE — Progress Notes (Signed)
Darryl Velazquez presents today for phlebotomy per MD orders. Phlebotomy procedure started at [place] with 16 gauge phlebotomy kit and ended at 0817. 549 grams removed. Refreshments given.  Patient observed for 30 minutes after procedure without any incident. Patient tolerated procedure well. IV needle removed intact.

## 2021-11-21 ENCOUNTER — Inpatient Hospital Stay: Payer: BC Managed Care – PPO

## 2021-11-22 ENCOUNTER — Other Ambulatory Visit: Payer: Self-pay | Admitting: Gastroenterology

## 2021-11-24 DIAGNOSIS — Z01818 Encounter for other preprocedural examination: Secondary | ICD-10-CM | POA: Diagnosis not present

## 2021-11-24 DIAGNOSIS — R6889 Other general symptoms and signs: Secondary | ICD-10-CM | POA: Diagnosis not present

## 2021-11-24 DIAGNOSIS — K7469 Other cirrhosis of liver: Secondary | ICD-10-CM | POA: Diagnosis not present

## 2021-11-25 DIAGNOSIS — Z01818 Encounter for other preprocedural examination: Secondary | ICD-10-CM | POA: Diagnosis not present

## 2021-11-25 DIAGNOSIS — I5031 Acute diastolic (congestive) heart failure: Secondary | ICD-10-CM | POA: Diagnosis not present

## 2021-11-25 DIAGNOSIS — K766 Portal hypertension: Secondary | ICD-10-CM | POA: Diagnosis not present

## 2021-11-25 DIAGNOSIS — K7460 Unspecified cirrhosis of liver: Secondary | ICD-10-CM | POA: Diagnosis not present

## 2021-11-25 DIAGNOSIS — I6523 Occlusion and stenosis of bilateral carotid arteries: Secondary | ICD-10-CM | POA: Diagnosis not present

## 2021-11-25 DIAGNOSIS — R188 Other ascites: Secondary | ICD-10-CM | POA: Diagnosis not present

## 2021-11-26 ENCOUNTER — Ambulatory Visit: Payer: BC Managed Care – PPO | Admitting: Gastroenterology

## 2021-11-26 DIAGNOSIS — Z01818 Encounter for other preprocedural examination: Secondary | ICD-10-CM | POA: Diagnosis not present

## 2021-11-26 DIAGNOSIS — K7682 Hepatic encephalopathy: Secondary | ICD-10-CM | POA: Diagnosis not present

## 2021-11-26 DIAGNOSIS — F419 Anxiety disorder, unspecified: Secondary | ICD-10-CM | POA: Diagnosis not present

## 2021-11-26 DIAGNOSIS — R188 Other ascites: Secondary | ICD-10-CM | POA: Diagnosis not present

## 2021-11-26 DIAGNOSIS — R5383 Other fatigue: Secondary | ICD-10-CM | POA: Diagnosis not present

## 2021-11-26 DIAGNOSIS — Z515 Encounter for palliative care: Secondary | ICD-10-CM | POA: Diagnosis not present

## 2021-11-26 DIAGNOSIS — K7469 Other cirrhosis of liver: Secondary | ICD-10-CM | POA: Diagnosis not present

## 2021-11-26 DIAGNOSIS — K7460 Unspecified cirrhosis of liver: Secondary | ICD-10-CM | POA: Diagnosis not present

## 2021-11-28 ENCOUNTER — Inpatient Hospital Stay: Payer: BC Managed Care – PPO

## 2021-12-01 DIAGNOSIS — Z01818 Encounter for other preprocedural examination: Secondary | ICD-10-CM | POA: Diagnosis not present

## 2021-12-02 DIAGNOSIS — I2584 Coronary atherosclerosis due to calcified coronary lesion: Secondary | ICD-10-CM | POA: Diagnosis not present

## 2021-12-02 DIAGNOSIS — I2510 Atherosclerotic heart disease of native coronary artery without angina pectoris: Secondary | ICD-10-CM | POA: Diagnosis not present

## 2021-12-02 DIAGNOSIS — I700 Atherosclerosis of aorta: Secondary | ICD-10-CM | POA: Diagnosis not present

## 2021-12-02 DIAGNOSIS — Z01818 Encounter for other preprocedural examination: Secondary | ICD-10-CM | POA: Diagnosis not present

## 2021-12-03 ENCOUNTER — Telehealth: Payer: Self-pay | Admitting: *Deleted

## 2021-12-03 DIAGNOSIS — E785 Hyperlipidemia, unspecified: Secondary | ICD-10-CM | POA: Diagnosis not present

## 2021-12-03 DIAGNOSIS — Z6830 Body mass index (BMI) 30.0-30.9, adult: Secondary | ICD-10-CM | POA: Diagnosis not present

## 2021-12-03 DIAGNOSIS — Z131 Encounter for screening for diabetes mellitus: Secondary | ICD-10-CM | POA: Diagnosis not present

## 2021-12-03 DIAGNOSIS — K767 Hepatorenal syndrome: Secondary | ICD-10-CM | POA: Diagnosis not present

## 2021-12-03 DIAGNOSIS — Z125 Encounter for screening for malignant neoplasm of prostate: Secondary | ICD-10-CM | POA: Diagnosis not present

## 2021-12-03 DIAGNOSIS — Z0000 Encounter for general adult medical examination without abnormal findings: Secondary | ICD-10-CM | POA: Diagnosis not present

## 2021-12-03 NOTE — Telephone Encounter (Signed)
Message received from patient wanting to know if Darryl Velazquez had received a call from Dr. Maurilio Lovely in [place].  Darryl Velazquez states that Dr. Maurilio Lovely recommended that he not receive any further phlebotomies at this time. [place] NP notified. Call placed back to patient to notify him per order of S. Eulas Post NP that she had not received any information from Dr. Maurilio Lovely, but that she can see his office note on Care Everywhere.  Darryl Velazquez notified per order of Darryl Velazquez that lab appt will be canceled for this Friday, 12/05/20 and to keep scheduled appts on 12/26/21.  Darryl Velazquez is appreciative of call back and has no further questions at this time.

## 2021-12-05 ENCOUNTER — Inpatient Hospital Stay: Payer: BC Managed Care – PPO

## 2021-12-26 ENCOUNTER — Inpatient Hospital Stay: Payer: BC Managed Care – PPO | Admitting: Family

## 2021-12-26 ENCOUNTER — Inpatient Hospital Stay: Payer: BC Managed Care – PPO

## 2022-01-06 ENCOUNTER — Inpatient Hospital Stay: Payer: BC Managed Care – PPO | Attending: Hematology & Oncology

## 2022-01-06 ENCOUNTER — Encounter: Payer: Self-pay | Admitting: Family

## 2022-01-06 ENCOUNTER — Inpatient Hospital Stay (HOSPITAL_BASED_OUTPATIENT_CLINIC_OR_DEPARTMENT_OTHER): Payer: BC Managed Care – PPO | Admitting: Family

## 2022-01-06 ENCOUNTER — Telehealth: Payer: Self-pay | Admitting: *Deleted

## 2022-01-06 DIAGNOSIS — D649 Anemia, unspecified: Secondary | ICD-10-CM | POA: Diagnosis not present

## 2022-01-06 DIAGNOSIS — Z79899 Other long term (current) drug therapy: Secondary | ICD-10-CM | POA: Insufficient documentation

## 2022-01-06 LAB — CMP (CANCER CENTER ONLY)
ALT: 29 U/L (ref 0–44)
AST: 51 U/L — ABNORMAL HIGH (ref 15–41)
Albumin: 2.7 g/dL — ABNORMAL LOW (ref 3.5–5.0)
Alkaline Phosphatase: 149 U/L — ABNORMAL HIGH (ref 38–126)
Anion gap: 6 (ref 5–15)
BUN: 12 mg/dL (ref 6–20)
CO2: 28 mmol/L (ref 22–32)
Calcium: 8.9 mg/dL (ref 8.9–10.3)
Chloride: 99 mmol/L (ref 98–111)
Creatinine: 0.92 mg/dL (ref 0.61–1.24)
GFR, Estimated: >60 mL/min (ref >60)
Glucose, Bld: 127 mg/dL — ABNORMAL HIGH (ref 70–99)
Potassium: 4 mmol/L (ref 3.5–5.1)
Sodium: 133 mmol/L — ABNORMAL LOW (ref 135–145)
Total Bilirubin: 3.2 mg/dL — ABNORMAL HIGH (ref 0.3–1.2)
Total Protein: 6.4 g/dL — ABNORMAL LOW (ref 6.5–8.1)

## 2022-01-06 LAB — CBC WITH DIFFERENTIAL (CANCER CENTER ONLY)
Abs Immature Granulocytes: 0.01 10*3/uL (ref 0.00–0.07)
Basophils Absolute: 0.1 10*3/uL (ref 0.0–0.1)
Basophils Relative: 1 %
Eosinophils Absolute: 0.7 10*3/uL — ABNORMAL HIGH (ref 0.0–0.5)
Eosinophils Relative: 11 %
HCT: 30.7 % — ABNORMAL LOW (ref 39.0–52.0)
Hemoglobin: 10.5 g/dL — ABNORMAL LOW (ref 13.0–17.0)
Immature Granulocytes: 0 %
Lymphocytes Relative: 18 %
Lymphs Abs: 1.2 10*3/uL (ref 0.7–4.0)
MCH: 29.2 pg (ref 26.0–34.0)
MCHC: 34.2 g/dL (ref 30.0–36.0)
MCV: 85.5 fL (ref 80.0–100.0)
Monocytes Absolute: 1.3 10*3/uL — ABNORMAL HIGH (ref 0.1–1.0)
Monocytes Relative: 20 %
Neutro Abs: 3.2 10*3/uL (ref 1.7–7.7)
Neutrophils Relative %: 50 %
Platelet Count: 180 10*3/uL (ref 150–400)
RBC: 3.59 MIL/uL — ABNORMAL LOW (ref 4.22–5.81)
RDW: 16.7 % — ABNORMAL HIGH (ref 11.5–15.5)
WBC Count: 6.4 10*3/uL (ref 4.0–10.5)
nRBC: 0 % (ref 0.0–0.2)

## 2022-01-06 LAB — IRON AND IRON BINDING CAPACITY (CC-WL,HP ONLY)
Iron: 58 ug/dL (ref 45–182)
Saturation Ratios: 24 % (ref 17.9–39.5)
TIBC: 246 ug/dL — ABNORMAL LOW (ref 250–450)
UIBC: 188 ug/dL (ref 117–376)

## 2022-01-06 LAB — FERRITIN: Ferritin: 92 ng/mL (ref 24–336)

## 2022-01-06 NOTE — Telephone Encounter (Signed)
Per 01/06/22 los - Called and lvm of upcoming appointments - requested call back to confirm

## 2022-01-06 NOTE — Progress Notes (Signed)
Hematology and Oncology Follow Up Visit  Darryl Velazquez [id number] Dec 31, 1960 61 y.o. 01/06/2022   Principle Diagnosis:  Hemochromatosis, heterozygous for the H63D mutation  Cirrhosis - NASH   Current Therapy:        Phlebotomy with One Blood PRN to maintain ferritin < 100 and iron saturation < 50%   Interim History:  Darryl Velazquez is here today for follow-up. He is still struggling with fatigue, weakness, n/v, loss of appetite and abdominal bloating with NASH.  He states that he is still waiting to get onto the liver transplant list. He follows up with his transplant team at [facility] in 2 weeks.  Iron studies ar pending. Hgb is 10.5, MCV 85, platelets 180. We will adjust his parameters to help correct his anemia.  He denies fever, chills, n/v, cough, rash, dizziness, SOB, chest pain, palpitations or changes in bowel or bladder habits.  No swelling, tenderness, numbness or tingling in his extremities.  No falls or syncope reported.  As mentioned above he states that his appetite is low but he is doing his best to stay well hydrated throughout the day. His weight is 263 lbs.   ECOG Performance Status: 1 - Symptomatic but completely ambulatory  Medications:  Allergies as of 01/06/2022   No Known Allergies      Medication List        Accurate as of January 06, 2022  9:06 AM. If you have any questions, ask your nurse or doctor.          docusate sodium 100 MG capsule Commonly known as: Colace Take 1 capsule (100 mg total) by mouth 2 (two) times daily.   escitalopram 10 MG tablet Commonly known as: LEXAPRO Take 10 mg by mouth at bedtime.   famotidine 20 MG tablet Commonly known as: PEPCID Take 20 mg by mouth 2 (two) times daily.   furosemide 80 MG tablet Commonly known as: LASIX TAKE 1 TABLET (80 MG TOTAL) BY MOUTH EVERY OTHER DAY   gabapentin 300 MG capsule Commonly known as: NEURONTIN Take by mouth.   gabapentin 100 MG capsule Commonly known as: NEURONTIN Take 1  capsule (100 mg total) by mouth at bedtime.   HYDROcodone-acetaminophen 5-325 MG tablet Commonly known as: NORCO/VICODIN Take 1 tablet by mouth every 6 (six) hours as needed.   meloxicam 15 MG tablet Commonly known as: MOBIC Take 15 mg by mouth daily as needed.   methocarbamol 500 MG tablet Commonly known as: ROBAXIN Take 500 mg by mouth in the morning and at bedtime.   ondansetron 8 MG disintegrating tablet Commonly known as: ZOFRAN-ODT Take 8 mg by mouth every 8 (eight) hours as needed.   promethazine 25 MG suppository Commonly known as: PHENERGAN Place 1 suppository rectally every 6 (six) hours as needed.   ramelteon 8 MG tablet Commonly known as: ROZEREM Take 8 mg by mouth at bedtime.   rifaximin 550 MG Tabs tablet Commonly known as: XIFAXAN Take 1 tablet (550 mg total) by mouth 2 (two) times daily.   rosuvastatin 10 MG tablet Commonly known as: CRESTOR Take 10 mg by mouth daily.   spironolactone 100 MG tablet Commonly known as: Aldactone Take 2 tablets (200 mg total) by mouth daily.   Taurine 1000 MG Caps Take 1 capsule by mouth 2 (two) times daily.   traZODone 50 MG tablet Commonly known as: DESYREL Take 1 tablet (50 mg total) by mouth at bedtime as needed for sleep.   zinc gluconate 50 MG tablet Take 50  mg by mouth daily.        Allergies: No Known Allergies  Past Medical History, Surgical history, Social history, and Family History were reviewed and updated.  Review of Systems: All other 10 point review of systems is negative.   Physical Exam:  weight is 263 lb 1.9 oz (119.4 kg). His oral temperature is 97.9 F (36.6 C). His blood pressure is 150/75 (abnormal) and his pulse is 70. His respiration is 18 and oxygen saturation is 100%.   Wt Readings from Last 3 Encounters:  01/06/22 263 lb 1.9 oz (119.4 kg)  10/31/21 241 lb 1.3 oz (109.4 kg)  10/11/21 258 lb 6.1 oz (117.2 kg)    Ocular: Sclerae unicteric, pupils equal, round and reactive to  light Ear-nose-throat: Oropharynx clear, dentition fair Lymphatic: No cervical or supraclavicular adenopathy Lungs no rales or rhonchi, good excursion bilaterally Heart regular rate and rhythm, no murmur appreciated Abd soft, nontender, positive bowel sounds MSK no focal spinal tenderness, no joint edema Neuro: non-focal, well-oriented, appropriate affect Breasts: Deferred  Lab Results  Component Value Date   WBC 6.4 01/06/2022   HGB 10.5 (L) 01/06/2022   HCT 30.7 (L) 01/06/2022   MCV 85.5 01/06/2022   PLT 180 01/06/2022   Lab Results  Component Value Date   FERRITIN 175 10/31/2021   IRON 252 (H) 10/31/2021   TIBC 248 (L) 10/31/2021   UIBC UNABLE TO CALCULATE 10/31/2021   IRONPCTSAT 102 (H) 10/31/2021   Lab Results  Component Value Date   RBC 3.59 (L) 01/06/2022   No results found for: "KPAFRELGTCHN", "LAMBDASER", "KAPLAMBRATIO" Lab Results  Component Value Date   IGGSERUM 2,209 (H) 05/08/2020   IGMSERUM 195 05/08/2020   No results found for: "TOTALPROTELP", "ALBUMINELP", "A1GS", "A2GS", "BETS", "BETA2SER", "GAMS", "MSPIKE", "SPEI"   Chemistry      Component Value Date/Time   NA 133 (L) 01/06/2022 0746   NA 140 04/19/2020 1458   K 4.0 01/06/2022 0746   CL 99 01/06/2022 0746   CO2 28 01/06/2022 0746   BUN 12 01/06/2022 0746   BUN 11 04/19/2020 1458   CREATININE 0.92 01/06/2022 0746      Component Value Date/Time   CALCIUM 8.9 01/06/2022 0746   ALKPHOS 149 (H) 01/06/2022 0746   AST 51 (H) 01/06/2022 0746   ALT 29 01/06/2022 0746   BILITOT 3.2 (H) 01/06/2022 0746       Impression and Plan: Darryl Velazquez is a very pleasant 61 yo caucasian gentleman with cirrhosis and hemochromatosis, heterozygous for the H63D mutations.  Iron studies are pending. No phlebotomy at this time.  Follow-up in 2 months.   Lottie Dawson, NP 8/1/20239:06 AM

## 2022-01-08 ENCOUNTER — Telehealth: Payer: Self-pay

## 2022-01-08 NOTE — Telephone Encounter (Signed)
Spoke with Darryl Velazquez, Patient's wife in regards to scheduling his Ultrasound and AFP tumor marker. Stated he will do his lab on the same day when he comes for ultrasound. Patient is scheduled for RUQ Ultasound on 01/16/22 at 9:30 am at [facility]. Lab order is already placed. Left VM at Robin's cell phone and sent MyChart message as well.

## 2022-01-14 DIAGNOSIS — F331 Major depressive disorder, recurrent, moderate: Secondary | ICD-10-CM | POA: Diagnosis not present

## 2022-01-14 DIAGNOSIS — K767 Hepatorenal syndrome: Secondary | ICD-10-CM | POA: Diagnosis not present

## 2022-01-14 DIAGNOSIS — S0502XA Injury of conjunctiva and corneal abrasion without foreign body, left eye, initial encounter: Secondary | ICD-10-CM | POA: Diagnosis not present

## 2022-01-16 ENCOUNTER — Ambulatory Visit (HOSPITAL_COMMUNITY)
Admission: RE | Admit: 2022-01-16 | Discharge: 2022-01-16 | Disposition: A | Discharge status: Home / Self Care | Payer: BC Managed Care – PPO | Source: Ambulatory Visit | Attending: Gastroenterology | Admitting: Gastroenterology

## 2022-01-16 DIAGNOSIS — K7581 Nonalcoholic steatohepatitis (NASH): Secondary | ICD-10-CM | POA: Insufficient documentation

## 2022-01-16 DIAGNOSIS — R188 Other ascites: Secondary | ICD-10-CM | POA: Diagnosis not present

## 2022-01-16 DIAGNOSIS — K7460 Unspecified cirrhosis of liver: Secondary | ICD-10-CM

## 2022-01-16 DIAGNOSIS — K828 Other specified diseases of gallbladder: Secondary | ICD-10-CM | POA: Diagnosis not present

## 2022-01-20 DIAGNOSIS — K7682 Hepatic encephalopathy: Secondary | ICD-10-CM | POA: Diagnosis not present

## 2022-01-20 DIAGNOSIS — K7469 Other cirrhosis of liver: Secondary | ICD-10-CM | POA: Diagnosis not present

## 2022-01-20 DIAGNOSIS — Z01818 Encounter for other preprocedural examination: Secondary | ICD-10-CM | POA: Diagnosis not present

## 2022-01-20 DIAGNOSIS — R188 Other ascites: Secondary | ICD-10-CM | POA: Diagnosis not present

## 2022-02-17 DIAGNOSIS — H2513 Age-related nuclear cataract, bilateral: Secondary | ICD-10-CM | POA: Diagnosis not present

## 2022-02-17 DIAGNOSIS — H00024 Hordeolum internum left upper eyelid: Secondary | ICD-10-CM | POA: Diagnosis not present

## 2022-03-09 ENCOUNTER — Inpatient Hospital Stay: Payer: BC Managed Care – PPO | Admitting: Family

## 2022-03-09 ENCOUNTER — Inpatient Hospital Stay: Payer: BC Managed Care – PPO | Attending: Hematology & Oncology

## 2022-03-30 DIAGNOSIS — K7469 Other cirrhosis of liver: Secondary | ICD-10-CM | POA: Diagnosis not present

## 2022-03-30 DIAGNOSIS — Z01818 Encounter for other preprocedural examination: Secondary | ICD-10-CM | POA: Diagnosis not present

## 2022-04-13 ENCOUNTER — Telehealth: Payer: Self-pay

## 2022-04-13 NOTE — Patient Outreach (Signed)
  Care Coordination   Initial Visit Note   04/13/2022 Name: MOMODOU CONSIGLIO MRN: [id number] DOB: 08/07/1960  Christella Scheuermann is a 61 y.o. year old male who sees [name], MD for primary care. I spoke with  Christella Scheuermann by phone today.  What matters to the patients health and wellness today?  Placed call to patient to review [facility] care coordination program. Patient  reports that he is on a liver transplant list and he is just waiting. Denies any current needs right now. Reviewed with patient that the program is available to him at anytime.     SDOH assessments and interventions completed:  No     Care Coordination Interventions Activated:  No  Care Coordination Interventions:  No, not indicated   Follow up plan: No further intervention required.   Encounter Outcome:  Pt. Refused    Tomasa Rand, RN, BSN, CEN [facility] [employer] [phone]

## 2022-04-24 DIAGNOSIS — Z01818 Encounter for other preprocedural examination: Secondary | ICD-10-CM | POA: Diagnosis not present

## 2022-04-28 DIAGNOSIS — K7682 Hepatic encephalopathy: Secondary | ICD-10-CM | POA: Diagnosis not present

## 2022-04-28 DIAGNOSIS — R188 Other ascites: Secondary | ICD-10-CM | POA: Diagnosis not present

## 2022-04-28 DIAGNOSIS — K766 Portal hypertension: Secondary | ICD-10-CM | POA: Diagnosis not present

## 2022-05-05 DIAGNOSIS — K7682 Hepatic encephalopathy: Secondary | ICD-10-CM | POA: Diagnosis not present

## 2022-05-05 DIAGNOSIS — K766 Portal hypertension: Secondary | ICD-10-CM | POA: Diagnosis not present

## 2022-05-05 DIAGNOSIS — K7469 Other cirrhosis of liver: Secondary | ICD-10-CM | POA: Diagnosis not present

## 2022-05-05 DIAGNOSIS — R188 Other ascites: Secondary | ICD-10-CM | POA: Diagnosis not present

## 2022-05-19 DIAGNOSIS — Z01818 Encounter for other preprocedural examination: Secondary | ICD-10-CM | POA: Diagnosis not present

## 2022-05-30 ENCOUNTER — Other Ambulatory Visit: Payer: Self-pay | Admitting: Gastroenterology

## 2022-06-15 DIAGNOSIS — R55 Syncope and collapse: Secondary | ICD-10-CM | POA: Diagnosis not present

## 2022-06-15 DIAGNOSIS — F331 Major depressive disorder, recurrent, moderate: Secondary | ICD-10-CM | POA: Diagnosis not present

## 2022-06-15 DIAGNOSIS — S99912A Unspecified injury of left ankle, initial encounter: Secondary | ICD-10-CM | POA: Diagnosis not present

## 2022-06-15 DIAGNOSIS — R188 Other ascites: Secondary | ICD-10-CM | POA: Diagnosis not present

## 2022-06-15 DIAGNOSIS — K7460 Unspecified cirrhosis of liver: Secondary | ICD-10-CM | POA: Diagnosis not present

## 2022-06-17 DIAGNOSIS — K7469 Other cirrhosis of liver: Secondary | ICD-10-CM | POA: Diagnosis not present

## 2022-06-23 ENCOUNTER — Encounter: Payer: Self-pay | Admitting: Gastroenterology

## 2022-06-24 DIAGNOSIS — K7469 Other cirrhosis of liver: Secondary | ICD-10-CM | POA: Diagnosis not present

## 2022-07-22 DIAGNOSIS — K7469 Other cirrhosis of liver: Secondary | ICD-10-CM | POA: Diagnosis not present

## 2022-08-04 DIAGNOSIS — K3189 Other diseases of stomach and duodenum: Secondary | ICD-10-CM | POA: Diagnosis not present

## 2022-08-04 DIAGNOSIS — K7682 Hepatic encephalopathy: Secondary | ICD-10-CM | POA: Diagnosis not present

## 2022-08-04 DIAGNOSIS — R188 Other ascites: Secondary | ICD-10-CM | POA: Diagnosis not present

## 2022-08-04 DIAGNOSIS — K766 Portal hypertension: Secondary | ICD-10-CM | POA: Diagnosis not present

## 2022-08-04 DIAGNOSIS — E441 Mild protein-calorie malnutrition: Secondary | ICD-10-CM | POA: Diagnosis not present

## 2022-08-04 DIAGNOSIS — K7469 Other cirrhosis of liver: Secondary | ICD-10-CM | POA: Diagnosis not present

## 2022-08-05 ENCOUNTER — Other Ambulatory Visit: Payer: Self-pay | Admitting: Nurse Practitioner

## 2022-08-05 DIAGNOSIS — K7469 Other cirrhosis of liver: Secondary | ICD-10-CM

## 2022-08-11 DIAGNOSIS — R300 Dysuria: Secondary | ICD-10-CM | POA: Diagnosis not present

## 2022-08-14 DIAGNOSIS — K7460 Unspecified cirrhosis of liver: Secondary | ICD-10-CM | POA: Diagnosis not present

## 2022-08-14 DIAGNOSIS — M545 Low back pain, unspecified: Secondary | ICD-10-CM | POA: Diagnosis not present

## 2022-08-14 DIAGNOSIS — Z6829 Body mass index (BMI) 29.0-29.9, adult: Secondary | ICD-10-CM | POA: Diagnosis not present

## 2022-08-14 DIAGNOSIS — R300 Dysuria: Secondary | ICD-10-CM | POA: Diagnosis not present

## 2022-08-24 ENCOUNTER — Telehealth: Payer: Self-pay

## 2022-08-24 NOTE — Patient Outreach (Signed)
  Care Coordination   08/24/2022 Name: Darryl Velazquez MRN: [id number] DOB: 06-02-1961   Care Coordination Outreach Attempts:  An unsuccessful telephone outreach was attempted today to offer the patient information about available care coordination services as a benefit of their health plan.   Follow Up Plan:  Additional outreach attempts will be made to offer the patient care coordination information and services.   Encounter Outcome:  No Answer   Care Coordination Interventions:  No, not indicated    Tomasa Rand, RN, BSN, [facility] [employer] [phone]

## 2022-08-26 ENCOUNTER — Telehealth: Payer: Self-pay

## 2022-08-26 NOTE — Patient Outreach (Signed)
  Care Coordination   Initial Visit Note   08/26/2022 Name: Darryl Velazquez MRN: [id number] DOB: 20-Nov-1960  Christella Scheuermann is a 62 y.o. year old male who sees [name], MD for primary care. I spoke with  Christella Scheuermann by phone today.  What matters to the patients health and wellness today?  Placed call to patient to review and offer [facility] care coordination program. Patient reports that he is on the waiting list for a liver transplant. Reports he has close follow up with MD. Denies any needs today.      SDOH assessments and interventions completed:  No     Care Coordination Interventions:  No, not indicated   Follow up plan: No further intervention required.   Encounter Outcome:  No Answer   Tomasa Rand, RN, BSN, CEN [place] Coordinator [phone]

## 2022-08-27 ENCOUNTER — Other Ambulatory Visit: Payer: Self-pay | Admitting: Gastroenterology

## 2022-08-31 DIAGNOSIS — K7469 Other cirrhosis of liver: Secondary | ICD-10-CM | POA: Diagnosis not present

## 2022-09-07 ENCOUNTER — Encounter: Payer: Self-pay | Admitting: Nurse Practitioner

## 2022-09-10 ENCOUNTER — Other Ambulatory Visit: Payer: BC Managed Care – PPO

## 2022-09-14 ENCOUNTER — Other Ambulatory Visit: Payer: BC Managed Care – PPO

## 2022-09-28 DIAGNOSIS — K7469 Other cirrhosis of liver: Secondary | ICD-10-CM | POA: Diagnosis not present

## 2022-09-30 DIAGNOSIS — R112 Nausea with vomiting, unspecified: Secondary | ICD-10-CM | POA: Diagnosis not present

## 2022-09-30 DIAGNOSIS — Z7682 Awaiting organ transplant status: Secondary | ICD-10-CM | POA: Diagnosis not present

## 2022-09-30 DIAGNOSIS — K429 Umbilical hernia without obstruction or gangrene: Secondary | ICD-10-CM | POA: Diagnosis not present

## 2022-09-30 DIAGNOSIS — R1084 Generalized abdominal pain: Secondary | ICD-10-CM | POA: Diagnosis not present

## 2022-09-30 DIAGNOSIS — K7460 Unspecified cirrhosis of liver: Secondary | ICD-10-CM | POA: Diagnosis not present

## 2022-10-03 ENCOUNTER — Ambulatory Visit
Admission: RE | Admit: 2022-10-03 | Discharge: 2022-10-03 | Disposition: A | Discharge status: Home / Self Care | Payer: BC Managed Care – PPO | Source: Ambulatory Visit | Attending: Nurse Practitioner | Admitting: Nurse Practitioner

## 2022-10-03 DIAGNOSIS — K7460 Unspecified cirrhosis of liver: Secondary | ICD-10-CM | POA: Diagnosis not present

## 2022-10-03 DIAGNOSIS — K7469 Other cirrhosis of liver: Secondary | ICD-10-CM

## 2022-10-03 MED ORDER — GADOPICLENOL 0.5 MMOL/ML IV SOLN
10.0000 mL | Freq: Once | INTRAVENOUS | Status: AC | PRN
Start: 2022-10-03 — End: 2022-10-03
  Administered 2022-10-03: 10 mL via INTRAVENOUS

## 2022-10-30 DIAGNOSIS — K7469 Other cirrhosis of liver: Secondary | ICD-10-CM | POA: Diagnosis not present

## 2022-11-10 ENCOUNTER — Other Ambulatory Visit (HOSPITAL_COMMUNITY): Payer: Self-pay | Admitting: Nurse Practitioner

## 2022-11-10 DIAGNOSIS — R188 Other ascites: Secondary | ICD-10-CM

## 2022-11-10 DIAGNOSIS — E441 Mild protein-calorie malnutrition: Secondary | ICD-10-CM | POA: Diagnosis not present

## 2022-11-10 DIAGNOSIS — K7469 Other cirrhosis of liver: Secondary | ICD-10-CM | POA: Diagnosis not present

## 2022-11-10 DIAGNOSIS — K3189 Other diseases of stomach and duodenum: Secondary | ICD-10-CM | POA: Diagnosis not present

## 2022-11-10 DIAGNOSIS — K7682 Hepatic encephalopathy: Secondary | ICD-10-CM | POA: Diagnosis not present

## 2022-11-10 DIAGNOSIS — K766 Portal hypertension: Secondary | ICD-10-CM | POA: Diagnosis not present

## 2022-11-11 DIAGNOSIS — K7460 Unspecified cirrhosis of liver: Secondary | ICD-10-CM | POA: Diagnosis not present

## 2022-11-11 DIAGNOSIS — Z79899 Other long term (current) drug therapy: Secondary | ICD-10-CM | POA: Diagnosis not present

## 2022-11-11 DIAGNOSIS — Z7682 Awaiting organ transplant status: Secondary | ICD-10-CM | POA: Diagnosis not present

## 2022-11-11 DIAGNOSIS — I493 Ventricular premature depolarization: Secondary | ICD-10-CM | POA: Diagnosis not present

## 2022-11-11 DIAGNOSIS — K3189 Other diseases of stomach and duodenum: Secondary | ICD-10-CM | POA: Diagnosis not present

## 2022-11-11 DIAGNOSIS — K7581 Nonalcoholic steatohepatitis (NASH): Secondary | ICD-10-CM | POA: Diagnosis not present

## 2022-11-11 DIAGNOSIS — Z01818 Encounter for other preprocedural examination: Secondary | ICD-10-CM | POA: Diagnosis not present

## 2022-11-11 DIAGNOSIS — Z87891 Personal history of nicotine dependence: Secondary | ICD-10-CM | POA: Diagnosis not present

## 2022-11-11 DIAGNOSIS — K766 Portal hypertension: Secondary | ICD-10-CM | POA: Diagnosis not present

## 2022-11-12 DIAGNOSIS — F1290 Cannabis use, unspecified, uncomplicated: Secondary | ICD-10-CM | POA: Diagnosis not present

## 2022-11-12 DIAGNOSIS — Z21 Asymptomatic human immunodeficiency virus [HIV] infection status: Secondary | ICD-10-CM | POA: Diagnosis not present

## 2022-11-12 DIAGNOSIS — K7469 Other cirrhosis of liver: Secondary | ICD-10-CM | POA: Diagnosis not present

## 2022-11-16 DIAGNOSIS — K7469 Other cirrhosis of liver: Secondary | ICD-10-CM | POA: Diagnosis not present

## 2022-11-24 DIAGNOSIS — R188 Other ascites: Secondary | ICD-10-CM | POA: Diagnosis not present

## 2022-11-24 DIAGNOSIS — K766 Portal hypertension: Secondary | ICD-10-CM | POA: Diagnosis not present

## 2022-11-24 DIAGNOSIS — K7460 Unspecified cirrhosis of liver: Secondary | ICD-10-CM | POA: Diagnosis not present

## 2022-11-24 DIAGNOSIS — Z01818 Encounter for other preprocedural examination: Secondary | ICD-10-CM | POA: Diagnosis not present

## 2022-11-24 DIAGNOSIS — K4090 Unilateral inguinal hernia, without obstruction or gangrene, not specified as recurrent: Secondary | ICD-10-CM | POA: Diagnosis not present

## 2022-11-24 DIAGNOSIS — K429 Umbilical hernia without obstruction or gangrene: Secondary | ICD-10-CM | POA: Diagnosis not present

## 2022-12-03 DIAGNOSIS — I517 Cardiomegaly: Secondary | ICD-10-CM | POA: Diagnosis not present

## 2022-12-07 DIAGNOSIS — K7469 Other cirrhosis of liver: Secondary | ICD-10-CM | POA: Diagnosis not present

## 2022-12-07 DIAGNOSIS — Z21 Asymptomatic human immunodeficiency virus [HIV] infection status: Secondary | ICD-10-CM | POA: Diagnosis not present

## 2023-01-04 ENCOUNTER — Ambulatory Visit (HOSPITAL_COMMUNITY)
Admission: RE | Admit: 2023-01-04 | Discharge: 2023-01-04 | Disposition: A | Discharge status: Home / Self Care | Payer: BC Managed Care – PPO | Source: Ambulatory Visit | Attending: Nurse Practitioner | Admitting: Nurse Practitioner

## 2023-01-04 DIAGNOSIS — R188 Other ascites: Secondary | ICD-10-CM | POA: Diagnosis not present

## 2023-01-04 DIAGNOSIS — K7469 Other cirrhosis of liver: Secondary | ICD-10-CM | POA: Diagnosis not present

## 2023-01-04 DIAGNOSIS — K7460 Unspecified cirrhosis of liver: Secondary | ICD-10-CM | POA: Diagnosis not present

## 2023-01-04 HISTORY — PX: IR PARACENTESIS: IMG2679

## 2023-01-04 LAB — BODY FLUID CELL COUNT WITH DIFFERENTIAL
Eos, Fluid: 0 %
Lymphs, Fluid: 13 %
Monocyte-Macrophage-Serous Fluid: 85 % (ref 50–90)
Neutrophil Count, Fluid: 2 % (ref 0–25)
Total Nucleated Cell Count, Fluid: 166 cu mm (ref 0–1000)

## 2023-01-04 LAB — GRAM STAIN

## 2023-01-04 MED ORDER — LIDOCAINE HCL 1 % IJ SOLN
20.0000 mL | Freq: Once | INTRAMUSCULAR | Status: AC
  Administered 2023-01-04: 10 mL via INTRADERMAL

## 2023-01-04 MED ORDER — LIDOCAINE HCL 1 % IJ SOLN
INTRAMUSCULAR | Status: AC
  Filled 2023-01-04: qty 20

## 2023-01-04 NOTE — Procedures (Signed)
PROCEDURE SUMMARY:  Successful US guided paracentesis from right lateral abdomen.  Yielded 5.0 liters of yellow fluid.  No immediate complications.  Pt tolerated well.   Specimen was sent for labs.  EBL < 5mL  Hoyt Koch PA-C 01/04/2023 2:36 PM

## 2023-01-14 DIAGNOSIS — K7469 Other cirrhosis of liver: Secondary | ICD-10-CM | POA: Diagnosis not present

## 2023-01-14 DIAGNOSIS — E871 Hypo-osmolality and hyponatremia: Secondary | ICD-10-CM | POA: Diagnosis not present

## 2023-01-14 DIAGNOSIS — K420 Umbilical hernia with obstruction, without gangrene: Secondary | ICD-10-CM | POA: Diagnosis not present

## 2023-01-14 DIAGNOSIS — E860 Dehydration: Secondary | ICD-10-CM | POA: Diagnosis not present

## 2023-01-14 DIAGNOSIS — I129 Hypertensive chronic kidney disease with stage 1 through stage 4 chronic kidney disease, or unspecified chronic kidney disease: Secondary | ICD-10-CM | POA: Diagnosis not present

## 2023-01-14 DIAGNOSIS — N183 Chronic kidney disease, stage 3 unspecified: Secondary | ICD-10-CM | POA: Diagnosis not present

## 2023-01-14 DIAGNOSIS — F4329 Adjustment disorder with other symptoms: Secondary | ICD-10-CM | POA: Diagnosis not present

## 2023-01-14 DIAGNOSIS — N179 Acute kidney failure, unspecified: Secondary | ICD-10-CM | POA: Diagnosis not present

## 2023-01-14 DIAGNOSIS — K7210 Chronic hepatic failure without coma: Secondary | ICD-10-CM | POA: Diagnosis not present

## 2023-01-14 DIAGNOSIS — K56609 Unspecified intestinal obstruction, unspecified as to partial versus complete obstruction: Secondary | ICD-10-CM | POA: Diagnosis not present

## 2023-01-14 DIAGNOSIS — K551 Chronic vascular disorders of intestine: Secondary | ICD-10-CM | POA: Diagnosis not present

## 2023-01-14 DIAGNOSIS — K6389 Other specified diseases of intestine: Secondary | ICD-10-CM | POA: Diagnosis not present

## 2023-01-14 DIAGNOSIS — I739 Peripheral vascular disease, unspecified: Secondary | ICD-10-CM | POA: Diagnosis not present

## 2023-01-14 DIAGNOSIS — E876 Hypokalemia: Secondary | ICD-10-CM | POA: Diagnosis not present

## 2023-01-14 DIAGNOSIS — E872 Acidosis, unspecified: Secondary | ICD-10-CM | POA: Diagnosis not present

## 2023-01-14 DIAGNOSIS — Z6832 Body mass index (BMI) 32.0-32.9, adult: Secondary | ICD-10-CM | POA: Diagnosis not present

## 2023-01-14 DIAGNOSIS — D638 Anemia in other chronic diseases classified elsewhere: Secondary | ICD-10-CM | POA: Diagnosis not present

## 2023-01-14 DIAGNOSIS — R140 Abdominal distension (gaseous): Secondary | ICD-10-CM | POA: Diagnosis not present

## 2023-01-14 DIAGNOSIS — K449 Diaphragmatic hernia without obstruction or gangrene: Secondary | ICD-10-CM | POA: Diagnosis not present

## 2023-01-14 DIAGNOSIS — K766 Portal hypertension: Secondary | ICD-10-CM | POA: Diagnosis not present

## 2023-01-14 DIAGNOSIS — R188 Other ascites: Secondary | ICD-10-CM | POA: Diagnosis not present

## 2023-01-14 DIAGNOSIS — K429 Umbilical hernia without obstruction or gangrene: Secondary | ICD-10-CM | POA: Diagnosis not present

## 2023-01-15 DIAGNOSIS — K429 Umbilical hernia without obstruction or gangrene: Secondary | ICD-10-CM | POA: Diagnosis not present

## 2023-01-25 DIAGNOSIS — N179 Acute kidney failure, unspecified: Secondary | ICD-10-CM | POA: Diagnosis not present

## 2023-01-25 DIAGNOSIS — K8020 Calculus of gallbladder without cholecystitis without obstruction: Secondary | ICD-10-CM | POA: Diagnosis not present

## 2023-01-25 DIAGNOSIS — K659 Peritonitis, unspecified: Secondary | ICD-10-CM | POA: Diagnosis not present

## 2023-01-25 DIAGNOSIS — K652 Spontaneous bacterial peritonitis: Secondary | ICD-10-CM | POA: Diagnosis not present

## 2023-01-25 DIAGNOSIS — K449 Diaphragmatic hernia without obstruction or gangrene: Secondary | ICD-10-CM | POA: Diagnosis not present

## 2023-01-25 DIAGNOSIS — J9811 Atelectasis: Secondary | ICD-10-CM | POA: Diagnosis not present

## 2023-01-25 DIAGNOSIS — R0989 Other specified symptoms and signs involving the circulatory and respiratory systems: Secondary | ICD-10-CM | POA: Diagnosis not present

## 2023-01-25 DIAGNOSIS — R6520 Severe sepsis without septic shock: Secondary | ICD-10-CM | POA: Diagnosis not present

## 2023-01-25 DIAGNOSIS — K4090 Unilateral inguinal hernia, without obstruction or gangrene, not specified as recurrent: Secondary | ICD-10-CM | POA: Diagnosis not present

## 2023-01-25 DIAGNOSIS — K7469 Other cirrhosis of liver: Secondary | ICD-10-CM | POA: Diagnosis not present

## 2023-01-25 DIAGNOSIS — K7031 Alcoholic cirrhosis of liver with ascites: Secondary | ICD-10-CM | POA: Diagnosis not present

## 2023-01-25 DIAGNOSIS — Z9889 Other specified postprocedural states: Secondary | ICD-10-CM | POA: Diagnosis not present

## 2023-01-25 DIAGNOSIS — K7689 Other specified diseases of liver: Secondary | ICD-10-CM | POA: Diagnosis not present

## 2023-01-25 DIAGNOSIS — R1084 Generalized abdominal pain: Secondary | ICD-10-CM | POA: Diagnosis not present

## 2023-01-25 DIAGNOSIS — I517 Cardiomegaly: Secondary | ICD-10-CM | POA: Diagnosis not present

## 2023-01-25 DIAGNOSIS — Z4682 Encounter for fitting and adjustment of non-vascular catheter: Secondary | ICD-10-CM | POA: Diagnosis not present

## 2023-01-25 DIAGNOSIS — R188 Other ascites: Secondary | ICD-10-CM | POA: Diagnosis not present

## 2023-01-25 DIAGNOSIS — Z452 Encounter for adjustment and management of vascular access device: Secondary | ICD-10-CM | POA: Diagnosis not present

## 2023-01-25 DIAGNOSIS — A419 Sepsis, unspecified organism: Secondary | ICD-10-CM | POA: Diagnosis not present

## 2023-01-26 DIAGNOSIS — R1084 Generalized abdominal pain: Secondary | ICD-10-CM | POA: Diagnosis not present

## 2023-01-26 DIAGNOSIS — Z9889 Other specified postprocedural states: Secondary | ICD-10-CM | POA: Diagnosis not present

## 2023-01-26 DIAGNOSIS — K659 Peritonitis, unspecified: Secondary | ICD-10-CM | POA: Diagnosis not present

## 2023-01-26 DIAGNOSIS — K652 Spontaneous bacterial peritonitis: Secondary | ICD-10-CM | POA: Diagnosis not present

## 2023-01-27 DIAGNOSIS — K652 Spontaneous bacterial peritonitis: Secondary | ICD-10-CM | POA: Diagnosis not present

## 2023-01-27 DIAGNOSIS — R579 Shock, unspecified: Secondary | ICD-10-CM | POA: Diagnosis not present

## 2023-01-27 DIAGNOSIS — A4150 Gram-negative sepsis, unspecified: Secondary | ICD-10-CM | POA: Diagnosis not present

## 2023-01-27 DIAGNOSIS — Z9889 Other specified postprocedural states: Secondary | ICD-10-CM | POA: Diagnosis not present

## 2023-01-27 DIAGNOSIS — R1084 Generalized abdominal pain: Secondary | ICD-10-CM | POA: Diagnosis not present

## 2023-01-27 DIAGNOSIS — K659 Peritonitis, unspecified: Secondary | ICD-10-CM | POA: Diagnosis not present

## 2023-01-27 DIAGNOSIS — Z452 Encounter for adjustment and management of vascular access device: Secondary | ICD-10-CM | POA: Diagnosis not present

## 2023-01-27 DIAGNOSIS — A498 Other bacterial infections of unspecified site: Secondary | ICD-10-CM | POA: Diagnosis not present

## 2023-01-27 DIAGNOSIS — N179 Acute kidney failure, unspecified: Secondary | ICD-10-CM | POA: Diagnosis not present

## 2023-01-27 DIAGNOSIS — R6521 Severe sepsis with septic shock: Secondary | ICD-10-CM | POA: Diagnosis not present

## 2023-01-28 DIAGNOSIS — Z4682 Encounter for fitting and adjustment of non-vascular catheter: Secondary | ICD-10-CM | POA: Diagnosis not present

## 2023-01-28 DIAGNOSIS — N179 Acute kidney failure, unspecified: Secondary | ICD-10-CM | POA: Diagnosis not present

## 2023-01-28 DIAGNOSIS — K2289 Other specified disease of esophagus: Secondary | ICD-10-CM | POA: Diagnosis not present

## 2023-01-28 DIAGNOSIS — K659 Peritonitis, unspecified: Secondary | ICD-10-CM | POA: Diagnosis not present

## 2023-01-28 DIAGNOSIS — A498 Other bacterial infections of unspecified site: Secondary | ICD-10-CM | POA: Diagnosis not present

## 2023-01-28 DIAGNOSIS — K652 Spontaneous bacterial peritonitis: Secondary | ICD-10-CM | POA: Diagnosis not present

## 2023-01-28 DIAGNOSIS — R1084 Generalized abdominal pain: Secondary | ICD-10-CM | POA: Diagnosis not present

## 2023-01-28 DIAGNOSIS — Z9889 Other specified postprocedural states: Secondary | ICD-10-CM | POA: Diagnosis not present

## 2023-01-28 DIAGNOSIS — R140 Abdominal distension (gaseous): Secondary | ICD-10-CM | POA: Diagnosis not present

## 2023-01-29 DIAGNOSIS — A498 Other bacterial infections of unspecified site: Secondary | ICD-10-CM | POA: Diagnosis not present

## 2023-01-29 DIAGNOSIS — R1084 Generalized abdominal pain: Secondary | ICD-10-CM | POA: Diagnosis not present

## 2023-01-29 DIAGNOSIS — Z978 Presence of other specified devices: Secondary | ICD-10-CM | POA: Diagnosis not present

## 2023-01-29 DIAGNOSIS — Z4682 Encounter for fitting and adjustment of non-vascular catheter: Secondary | ICD-10-CM | POA: Diagnosis not present

## 2023-01-29 DIAGNOSIS — I491 Atrial premature depolarization: Secondary | ICD-10-CM | POA: Diagnosis not present

## 2023-01-29 DIAGNOSIS — N179 Acute kidney failure, unspecified: Secondary | ICD-10-CM | POA: Diagnosis not present

## 2023-01-29 DIAGNOSIS — I459 Conduction disorder, unspecified: Secondary | ICD-10-CM | POA: Diagnosis not present

## 2023-01-29 DIAGNOSIS — Z9889 Other specified postprocedural states: Secondary | ICD-10-CM | POA: Diagnosis not present

## 2023-01-29 DIAGNOSIS — K652 Spontaneous bacterial peritonitis: Secondary | ICD-10-CM | POA: Diagnosis not present

## 2023-01-29 DIAGNOSIS — K560 Paralytic ileus: Secondary | ICD-10-CM | POA: Diagnosis not present

## 2023-01-29 DIAGNOSIS — K659 Peritonitis, unspecified: Secondary | ICD-10-CM | POA: Diagnosis not present

## 2023-01-30 DIAGNOSIS — K652 Spontaneous bacterial peritonitis: Secondary | ICD-10-CM | POA: Diagnosis not present

## 2023-02-07 DEATH — deceased
